# Patient Record
Sex: Female | Born: 1955
Health system: Southern US, Community
[De-identification: ages and names within clinical notes are randomized; demographics above are authoritative.]

## PROBLEM LIST (undated history)

## (undated) DIAGNOSIS — M1990 Unspecified osteoarthritis, unspecified site: Secondary | ICD-10-CM

## (undated) DIAGNOSIS — E119 Type 2 diabetes mellitus without complications: Secondary | ICD-10-CM

## (undated) DIAGNOSIS — F32A Depression, unspecified: Secondary | ICD-10-CM

## (undated) DIAGNOSIS — F329 Major depressive disorder, single episode, unspecified: Secondary | ICD-10-CM

## (undated) DIAGNOSIS — E785 Hyperlipidemia, unspecified: Secondary | ICD-10-CM

## (undated) DIAGNOSIS — D649 Anemia, unspecified: Secondary | ICD-10-CM

## (undated) DIAGNOSIS — T7840XA Allergy, unspecified, initial encounter: Secondary | ICD-10-CM

## (undated) DIAGNOSIS — J45909 Unspecified asthma, uncomplicated: Secondary | ICD-10-CM

## (undated) DIAGNOSIS — F419 Anxiety disorder, unspecified: Secondary | ICD-10-CM

## (undated) DIAGNOSIS — K219 Gastro-esophageal reflux disease without esophagitis: Secondary | ICD-10-CM

## (undated) HISTORY — DX: Unspecified asthma, uncomplicated: J45.909

## (undated) HISTORY — DX: Allergy, unspecified, initial encounter: T78.40XA

## (undated) HISTORY — DX: Hyperlipidemia, unspecified: E78.5

## (undated) HISTORY — DX: Anemia, unspecified: D64.9

## (undated) HISTORY — PX: TRIGGER FINGER RELEASE: SHX641

## (undated) HISTORY — DX: Gastro-esophageal reflux disease without esophagitis: K21.9

## (undated) HISTORY — DX: Major depressive disorder, single episode, unspecified: F32.9

## (undated) HISTORY — DX: Depression, unspecified: F32.A

---

## 2000-02-19 ENCOUNTER — Ambulatory Visit (HOSPITAL_COMMUNITY): Admission: RE | Admit: 2000-02-19 | Discharge: 2000-02-19 | Payer: Self-pay | Admitting: Gastroenterology

## 2003-07-06 ENCOUNTER — Encounter: Payer: Self-pay | Admitting: Internal Medicine

## 2003-07-06 ENCOUNTER — Encounter: Admission: RE | Admit: 2003-07-06 | Discharge: 2003-07-06 | Payer: Self-pay | Admitting: Internal Medicine

## 2008-10-29 ENCOUNTER — Ambulatory Visit: Payer: Self-pay | Admitting: Internal Medicine

## 2009-05-06 ENCOUNTER — Ambulatory Visit: Payer: Self-pay | Admitting: Internal Medicine

## 2009-11-04 ENCOUNTER — Ambulatory Visit: Payer: Self-pay | Admitting: Internal Medicine

## 2010-07-18 ENCOUNTER — Ambulatory Visit: Payer: Self-pay | Admitting: Internal Medicine

## 2010-12-01 ENCOUNTER — Ambulatory Visit: Payer: Self-pay | Admitting: Internal Medicine

## 2011-05-11 NOTE — Procedures (Signed)
[facility]  Patient:    Donna Spears, Donna Spears                        MRN: [id number] Proc. Date: 02/19/00 Adm. Date:  [id number] Attending:  Nelda Marseille Dictator:   Petra Kuba, M.D. CC:         Petra Kuba, M.D.             Luanna Cole. Lenord Fellers, M.D.                           Procedure Report  PROCEDURE:  Colonoscopy.  INDICATIONS:  Colon cancer and colon polyps on both sides of the family with some bright red blood per rectum probably due to hemorrhoids.  CONSENT:  Consent was signed after risks, benefits, methods, and options were thoroughly discussed in the office.  ANESTHESIA: 1. Demerol 90 mg intravenously. 2. Versed 9 mg intravenously.  DESCRIPTION OF PROCEDURE:  Rectal inspection is pertinent for active internal and external hemorrhoids small.  Digital examination was negative.  A pediatric video colonoscope was inserted, and with some difficulty due to some tortuosity and some looping, we were able to advance to the cecum.  This did require some abdominal  pressure and rolling the patient on her back.  The cecum was identified by the appendiceal orifice and ileocecal valve.  Prep was adequate, although there was  lots of liquid stools that required washing and suctioning, but on slow withdrawal through the colon no abnormalities were seen, as well as on insertion there were no abnormalities seen.  Once back in the rectum the scope was retroflexed, pertinent for some internal hemorrhoids.  Scope was straightened.  The scope was removed.  The patient tolerated the procedure adequately.  There were no obvious immediate complication.  ENDOSCOPIC DIAGNOSES: 1. Internal and external hemorrhoids. 2. Otherwise within normal limits to the cecum.  PLAN: 1. Yearly rectals and guaiacs per Dr. Lenord Fellers. 2. Gastrointestinal follow-up p.r.n. 3. Otherwise, probably a recheck colon screening in 5 years unless needed sooner     p.r.n. DD:  02/19/00 TD:  02/19/00 Job: [id number]

## 2011-10-20 ENCOUNTER — Other Ambulatory Visit: Payer: Self-pay | Admitting: Internal Medicine

## 2011-12-23 ENCOUNTER — Other Ambulatory Visit: Payer: Self-pay | Admitting: Internal Medicine

## 2012-06-23 ENCOUNTER — Other Ambulatory Visit: Payer: Self-pay | Admitting: Internal Medicine

## 2012-08-08 ENCOUNTER — Other Ambulatory Visit: Payer: Self-pay | Admitting: Internal Medicine

## 2012-08-11 ENCOUNTER — Other Ambulatory Visit: Payer: Self-pay | Admitting: Internal Medicine

## 2012-08-11 DIAGNOSIS — Z0000 Encounter for general adult medical examination without abnormal findings: Secondary | ICD-10-CM

## 2012-08-11 LAB — CBC WITH DIFFERENTIAL/PLATELET
Basophils Absolute: 0 10*3/uL (ref 0.0–0.1)
Lymphocytes Relative: 35 % (ref 12–46)
Lymphs Abs: 1.6 10*3/uL (ref 0.7–4.0)
MCHC: 34.7 g/dL (ref 30.0–36.0)
Neutrophils Relative %: 50 % (ref 43–77)
Platelets: 234 10*3/uL (ref 150–400)
RDW: 13.3 % (ref 11.5–15.5)
WBC: 4.6 10*3/uL (ref 4.0–10.5)

## 2012-08-11 LAB — COMPREHENSIVE METABOLIC PANEL
BUN: 15 mg/dL (ref 6–23)
Chloride: 106 mEq/L (ref 96–112)
Creat: 0.58 mg/dL (ref 0.50–1.10)
Glucose, Bld: 85 mg/dL (ref 70–99)
Potassium: 4.1 mEq/L (ref 3.5–5.3)
Sodium: 142 mEq/L (ref 135–145)
Total Protein: 6.4 g/dL (ref 6.0–8.3)

## 2012-08-11 LAB — LIPID PANEL
Cholesterol: 307 mg/dL — ABNORMAL HIGH (ref 0–200)
HDL: 42 mg/dL (ref >39)
Total CHOL/HDL Ratio: 7.3 Ratio
Triglycerides: 273 mg/dL — ABNORMAL HIGH (ref <150)
VLDL: 55 mg/dL — ABNORMAL HIGH (ref 0–40)

## 2012-08-12 ENCOUNTER — Encounter: Payer: Self-pay | Admitting: Internal Medicine

## 2012-08-12 ENCOUNTER — Ambulatory Visit (INDEPENDENT_AMBULATORY_CARE_PROVIDER_SITE_OTHER): Payer: 59 | Admitting: Internal Medicine

## 2012-08-12 VITALS — BP 134/90 | HR 76 | Temp 99.0°F | Ht 63.5 in | Wt 186.5 lb

## 2012-08-12 DIAGNOSIS — F329 Major depressive disorder, single episode, unspecified: Secondary | ICD-10-CM

## 2012-08-12 DIAGNOSIS — K219 Gastro-esophageal reflux disease without esophagitis: Secondary | ICD-10-CM

## 2012-08-12 DIAGNOSIS — Z0000 Encounter for general adult medical examination without abnormal findings: Secondary | ICD-10-CM

## 2012-08-12 DIAGNOSIS — F32A Depression, unspecified: Secondary | ICD-10-CM

## 2012-08-12 DIAGNOSIS — E782 Mixed hyperlipidemia: Secondary | ICD-10-CM | POA: Insufficient documentation

## 2012-08-12 LAB — VITAMIN D 25 HYDROXY (VIT D DEFICIENCY, FRACTURES): Vit D, 25-Hydroxy: 32 ng/mL (ref 30–89)

## 2012-08-12 NOTE — Progress Notes (Signed)
Subjective:    Patient ID: Donna Spears, female    DOB: Sep 23, 1956, 56 y.o.   MRN: [id number]  HPI 56 year old white female with history of hyperlipidemia (mixed) GE reflux, depression, in today for health maintenance exam. Not seen here since December 2011. She has retired and is keeping 2 grandchildren. She's had a couple of falls over the past year but may be related to losing her balance or tripping. No frank syncope. She has run out of Lipitor. Lipid panel is abnormal with elevated total cholesterol and triglycerides and LDL cholesterol. Has been out of Lipitor for several months. Continues to take Nexium 40 mg daily and prostatic 50 mg daily. No known drug allergies.  Prior history of allergic rhinitis and fibrocystic breast disease. Due for mammogram in [facility]. Order was written and given to her. She had colonoscopy by Dr. Ewing Schlein 12/31/2005.  History of right trigger finger release by Dr. Mina Marble.  Tdap vaccine update given today.  Former GYN was Dr. Lelon Perla who has retired. She has appointment to be seen in September at Dr. Olegario Messier [facility]'s GYN office here in [place].  Social history: The patient worked in Tax adviser for [employer]. Has a college degree. Is married to a Runner, broadcasting/film/video. Does not smoke or consume alcohol.  Family history: Mother with history of heart problems and lung cancer as well as colon cancer. One brother in good health. 2 children a son and a daughter.    Review of Systems  Constitutional: Negative.   HENT: Negative.   Eyes: Negative.   Respiratory: Negative.   Cardiovascular: Negative.   Gastrointestinal: Negative.   Genitourinary: Negative.   Musculoskeletal: Negative.   Neurological: Negative.   Hematological: Negative.   Psychiatric/Behavioral: Negative.        Objective:   Physical Exam  Vitals reviewed. Constitutional: She is oriented to person, place, and time. She appears well-developed and well-nourished. No distress.    HENT:  Head: Normocephalic and atraumatic.  Right Ear: External ear normal.  Left Ear: External ear normal.  Mouth/Throat: Oropharynx is clear and moist. No oropharyngeal exudate.  Eyes: EOM are normal. Pupils are equal, round, and reactive to light. Right eye exhibits no discharge. Left eye exhibits no discharge. No scleral icterus.  Neck: Neck supple. No JVD present. No thyromegaly present.  Cardiovascular: Normal rate, regular rhythm, normal heart sounds and intact distal pulses.   No murmur heard. Pulmonary/Chest: Effort normal and breath sounds normal. No respiratory distress. She has no wheezes. She has no rales.       Breasts normal female without masses  Abdominal: Soft. Bowel sounds are normal. She exhibits no distension and no mass. There is no tenderness. There is no rebound.  Genitourinary:       Deferred to GYN  Musculoskeletal: She exhibits no edema.  Lymphadenopathy:    She has no cervical adenopathy.  Neurological: She is alert and oriented to person, place, and time. She has normal reflexes. No cranial nerve deficit. Coordination normal.  Skin: Skin is warm and dry. No rash noted. She is not diaphoretic.  Psychiatric: She has a normal mood and affect. Her behavior is normal. Judgment and thought content normal.          Assessment & Plan:  Hyperlipidemia  GE reflux  History of depression-controlled well with prostatic 50 mg daily  Plan: Refill Lipitor 40 mg (90) one by mouth daily with 2 refills. Have fasting lipid panel liver functions and office visit in 6 months. Refill Pristiq  and Nexium for one year 90 day prescription with 3 refills

## 2012-08-12 NOTE — Patient Instructions (Addendum)
Start Lipitor 40 mg daily. Return in 6 months for fasting lipid panel liver functions and office visit for followup of hyperlipidemia, depression and GE reflux. You have received Tdap vaccine today.

## 2012-08-26 ENCOUNTER — Other Ambulatory Visit: Payer: Self-pay | Admitting: Internal Medicine

## 2012-11-05 ENCOUNTER — Other Ambulatory Visit: Payer: Self-pay | Admitting: Obstetrics and Gynecology

## 2012-11-05 DIAGNOSIS — Z780 Asymptomatic menopausal state: Secondary | ICD-10-CM

## 2012-11-05 DIAGNOSIS — N6489 Other specified disorders of breast: Secondary | ICD-10-CM

## 2012-11-14 ENCOUNTER — Ambulatory Visit
Admission: RE | Admit: 2012-11-14 | Discharge: 2012-11-14 | Disposition: A | Discharge status: Home / Self Care | Payer: 59 | Source: Ambulatory Visit | Attending: Obstetrics and Gynecology | Admitting: Obstetrics and Gynecology

## 2012-11-14 DIAGNOSIS — Z780 Asymptomatic menopausal state: Secondary | ICD-10-CM

## 2012-11-14 DIAGNOSIS — N6489 Other specified disorders of breast: Secondary | ICD-10-CM

## 2013-01-24 ENCOUNTER — Ambulatory Visit: Payer: 59

## 2013-01-24 ENCOUNTER — Ambulatory Visit (INDEPENDENT_AMBULATORY_CARE_PROVIDER_SITE_OTHER): Payer: 59 | Admitting: Physician Assistant

## 2013-01-24 VITALS — BP 129/78 | HR 73 | Temp 97.8°F | Resp 16 | Ht 64.5 in | Wt 181.0 lb

## 2013-01-24 DIAGNOSIS — J209 Acute bronchitis, unspecified: Secondary | ICD-10-CM

## 2013-01-24 DIAGNOSIS — R05 Cough: Secondary | ICD-10-CM

## 2013-01-24 DIAGNOSIS — R059 Cough, unspecified: Secondary | ICD-10-CM

## 2013-01-24 DIAGNOSIS — J9801 Acute bronchospasm: Secondary | ICD-10-CM

## 2013-01-24 DIAGNOSIS — Z131 Encounter for screening for diabetes mellitus: Secondary | ICD-10-CM

## 2013-01-24 DIAGNOSIS — R062 Wheezing: Secondary | ICD-10-CM

## 2013-01-24 LAB — GLUCOSE, POCT (MANUAL RESULT ENTRY): POC Glucose: 83 mg/dl (ref 70–99)

## 2013-01-24 LAB — POCT CBC
Granulocyte percent: 65.9 %G (ref 37–80)
HCT, POC: 43.4 % (ref 37.7–47.9)
Hemoglobin: 13.8 g/dL (ref 12.2–16.2)
MCHC: 31.8 g/dL (ref 31.8–35.4)
MPV: 8.7 fL (ref 0–99.8)
POC Granulocyte: 4 (ref 2–6.9)
POC MID %: 7.2 %M (ref 0–12)

## 2013-01-24 MED ORDER — PREDNISONE 10 MG PO TABS: ORAL_TABLET | ORAL | Status: DC

## 2013-01-24 MED ORDER — LEVOFLOXACIN 500 MG PO TABS: 500.0000 mg | ORAL_TABLET | Freq: Every day | ORAL | Status: DC

## 2013-01-24 MED ORDER — ALBUTEROL SULFATE (2.5 MG/3ML) 0.083% IN NEBU
2.5000 mg | INHALATION_SOLUTION | Freq: Once | RESPIRATORY_TRACT | Status: AC
  Administered 2013-01-24: 2.5 mg via RESPIRATORY_TRACT

## 2013-01-24 MED ORDER — IPRATROPIUM BROMIDE 0.02 % IN SOLN
0.5000 mg | Freq: Once | RESPIRATORY_TRACT | Status: AC
  Administered 2013-01-24: 0.5 mg via RESPIRATORY_TRACT

## 2013-01-24 MED ORDER — HYDROCOD POLST-CHLORPHEN POLST 10-8 MG/5ML PO LQCR: 5.0000 mL | Freq: Two times a day (BID) | ORAL | Status: DC | PRN

## 2013-01-24 MED ORDER — ALBUTEROL SULFATE HFA 108 (90 BASE) MCG/ACT IN AERS: 2.0000 | INHALATION_SPRAY | Freq: Four times a day (QID) | RESPIRATORY_TRACT | Status: DC | PRN

## 2013-01-24 MED ORDER — MOMETASONE FURO-FORMOTEROL FUM 200-5 MCG/ACT IN AERO: 2.0000 | INHALATION_SPRAY | Freq: Two times a day (BID) | RESPIRATORY_TRACT | Status: DC

## 2013-01-24 MED ORDER — METHYLPREDNISOLONE ACETATE 80 MG/ML IJ SUSP
80.0000 mg | Freq: Once | INTRAMUSCULAR | Status: AC
  Administered 2013-01-24: 80 mg via INTRAMUSCULAR

## 2013-01-24 NOTE — Progress Notes (Signed)
Subjective:    Patient ID: Donna Spears, female    DOB: 02/10/1956, 57 y.o.   MRN: [id number]  HPI 57 yr old CF presents with ~10 day h/o cough, congestion, wheezing.  She had a fever for a few days when it started originally, but no fever the last several days.  She is asthmatic, but she doesn't have to take meds to keep it under control on a regular basis.   Review of Systems  All other systems reviewed and are negative.       Objective:   Physical Exam  Nursing note and vitals reviewed. Constitutional: She is oriented to person, place, and time. She appears well-developed and well-nourished.  HENT:  Head: Normocephalic and atraumatic.  Right Ear: External ear normal.  Left Ear: External ear normal.  Mouth/Throat: No oropharyngeal exudate.  Neck: Normal range of motion. Neck supple. No thyromegaly present.  Cardiovascular: Normal rate, regular rhythm and normal heart sounds.   Pulmonary/Chest: Effort normal. No respiratory distress. She has wheezes.       Pulse ox 98%. Diffuse rhonchi and wheezing with forced expiration.  Lymphadenopathy:    She has no cervical adenopathy.  Neurological: She is alert and oriented to person, place, and time.  Skin: Skin is warm and dry.  Psychiatric: She has a normal mood and affect. Her behavior is normal.   Results for orders placed in visit on 01/24/13  POCT CBC      Component Value Range   WBC 6.1  4.6 - 10.2 K/uL   Lymph, poc 1.6  0.6 - 3.4   POC LYMPH PERCENT 26.9  10 - 50 %L   MID (cbc) 0.4  0 - 0.9   POC MID % 7.2  0 - 12 %M   POC Granulocyte 4.0  2 - 6.9   Granulocyte percent 65.9  37 - 80 %G   RBC 4.96  4.04 - 5.48 M/uL   Hemoglobin 13.8  12.2 - 16.2 g/dL   HCT, POC 40.9  81.1 - 47.9 %   MCV 87.5  80 - 97 fL   MCH, POC 27.8  27 - 31.2 pg   MCHC 31.8  31.8 - 35.4 g/dL   RDW, POC 91.4     Platelet Count, POC 295  142 - 424 K/uL   MPV 8.7  0 - 99.8 fL  GLUCOSE, POCT (MANUAL RESULT ENTRY)      Component Value Range   POC  Glucose 83  70 - 99 mg/dl   [facility] reading (PRIMARY) by  Dr. Cleta Alberts as no infiltrate.    Assessment & Plan:  Bronchitis and bronchospasm-I doubt she will need the oral prednisone, but she requested the prescription as she has a history of asthma and she has often had to take oral prednisone if she wasn't improving. She will only take it if she doesn't improve over the next 36-48 hours. Meds ordered this encounter  Medications  . albuterol (PROVENTIL) (2.5 MG/3ML) 0.083% nebulizer solution 2.5 mg    Sig:   . ipratropium (ATROVENT) nebulizer solution 0.5 mg    Sig:   . methylPREDNISolone acetate (DEPO-MEDROL) injection 80 mg    Sig:   . levofloxacin (LEVAQUIN) 500 MG tablet    Sig: Take 1 tablet (500 mg total) by mouth daily.    Dispense:  7 tablet    Refill:  0    Order Specific Question:  Supervising Provider    Answer:  DOOLITTLE, ROBERT P [[id number]]  . chlorpheniramine-HYDROcodone (TUSSIONEX)  10-8 MG/5ML LQCR    Sig: Take 5 mLs by mouth every 12 (twelve) hours as needed.    Dispense:  140 mL    Refill:  0    Order Specific Question:  Supervising Provider    Answer:  DOOLITTLE, ROBERT P [[id number]]  . mometasone-formoterol (DULERA) 200-5 MCG/ACT AERO    Sig: Inhale 2 puffs into the lungs 2 (two) times daily.    Dispense:  8.8 g    Refill:  0    Order Specific Question:  Supervising Provider    Answer:  DOOLITTLE, ROBERT P [[id number]]  . predniSONE (DELTASONE) 10 MG tablet    Sig: 6,5,4,3,2,1    Dispense:  21 tablet    Refill:  0    Order Specific Question:  Supervising Provider    Answer:  DOOLITTLE, ROBERT P [[id number]]  . albuterol (PROVENTIL HFA;VENTOLIN HFA) 108 (90 BASE) MCG/ACT inhaler    Sig: Inhale 2 puffs into the lungs every 6 (six) hours as needed for wheezing.    Dispense:  1 Inhaler    Refill:  0    Order Specific Question:  Supervising Provider    Answer:  DOOLITTLE, ROBERT P [[id number]]

## 2013-01-24 NOTE — Patient Instructions (Addendum)
Drink plenty of fluids and rest.

## 2013-02-16 ENCOUNTER — Other Ambulatory Visit: Payer: 59 | Admitting: Internal Medicine

## 2013-02-17 ENCOUNTER — Ambulatory Visit: Payer: 59 | Admitting: Internal Medicine

## 2013-03-23 ENCOUNTER — Other Ambulatory Visit: Payer: 59 | Admitting: Internal Medicine

## 2013-03-23 DIAGNOSIS — Z79899 Other long term (current) drug therapy: Secondary | ICD-10-CM

## 2013-03-23 DIAGNOSIS — E785 Hyperlipidemia, unspecified: Secondary | ICD-10-CM

## 2013-03-23 LAB — HEPATIC FUNCTION PANEL: Bilirubin, Direct: <0.1 mg/dL (ref 0.0–0.3)

## 2013-03-23 LAB — LIPID PANEL
LDL Cholesterol: 99 mg/dL (ref 0–99)
Total CHOL/HDL Ratio: 3.6 Ratio
VLDL: 23 mg/dL (ref 0–40)

## 2013-03-24 ENCOUNTER — Encounter: Payer: Self-pay | Admitting: Internal Medicine

## 2013-03-24 ENCOUNTER — Ambulatory Visit (INDEPENDENT_AMBULATORY_CARE_PROVIDER_SITE_OTHER): Payer: 59 | Admitting: Internal Medicine

## 2013-03-24 VITALS — BP 120/74 | HR 76 | Temp 98.4°F | Wt 184.0 lb

## 2013-03-24 DIAGNOSIS — E785 Hyperlipidemia, unspecified: Secondary | ICD-10-CM

## 2013-03-24 DIAGNOSIS — F329 Major depressive disorder, single episode, unspecified: Secondary | ICD-10-CM

## 2013-03-24 DIAGNOSIS — F32A Depression, unspecified: Secondary | ICD-10-CM

## 2013-03-24 DIAGNOSIS — K219 Gastro-esophageal reflux disease without esophagitis: Secondary | ICD-10-CM

## 2013-03-24 MED ORDER — PRISTIQ 50 MG PO TB24: ORAL_TABLET | ORAL | Status: DC

## 2013-03-24 MED ORDER — NEXIUM 40 MG PO CPDR: DELAYED_RELEASE_CAPSULE | ORAL | Status: DC

## 2013-03-24 MED ORDER — ATORVASTATIN CALCIUM 40 MG PO TABS: 40.0000 mg | ORAL_TABLET | Freq: Every day | ORAL | Status: DC

## 2013-05-06 ENCOUNTER — Ambulatory Visit (INDEPENDENT_AMBULATORY_CARE_PROVIDER_SITE_OTHER): Payer: 59 | Admitting: Family Medicine

## 2013-05-06 VITALS — BP 132/73 | HR 91 | Temp 98.3°F | Resp 17 | Ht 64.0 in | Wt 182.0 lb

## 2013-05-06 DIAGNOSIS — J309 Allergic rhinitis, unspecified: Secondary | ICD-10-CM

## 2013-05-06 DIAGNOSIS — B009 Herpesviral infection, unspecified: Secondary | ICD-10-CM

## 2013-05-06 DIAGNOSIS — J45901 Unspecified asthma with (acute) exacerbation: Secondary | ICD-10-CM

## 2013-05-06 DIAGNOSIS — J302 Other seasonal allergic rhinitis: Secondary | ICD-10-CM

## 2013-05-06 MED ORDER — HYDROCOD POLST-CHLORPHEN POLST 10-8 MG/5ML PO LQCR: 5.0000 mL | Freq: Two times a day (BID) | ORAL | Status: DC | PRN

## 2013-05-06 MED ORDER — ACYCLOVIR 5 % EX OINT: TOPICAL_OINTMENT | CUTANEOUS | Status: DC

## 2013-05-06 MED ORDER — FLUTICASONE PROPIONATE 50 MCG/ACT NA SUSP: 2.0000 | Freq: Every day | NASAL | Status: DC

## 2013-05-06 MED ORDER — ALBUTEROL SULFATE HFA 108 (90 BASE) MCG/ACT IN AERS: 2.0000 | INHALATION_SPRAY | RESPIRATORY_TRACT | Status: DC | PRN

## 2013-05-06 MED ORDER — CETIRIZINE HCL 10 MG PO TABS: 10.0000 mg | ORAL_TABLET | Freq: Every day | ORAL | Status: DC

## 2013-05-06 MED ORDER — MOMETASONE FURO-FORMOTEROL FUM 200-5 MCG/ACT IN AERO: 2.0000 | INHALATION_SPRAY | Freq: Two times a day (BID) | RESPIRATORY_TRACT | Status: DC

## 2013-05-06 MED ORDER — METHYLPREDNISOLONE ACETATE 80 MG/ML IJ SUSP
80.0000 mg | Freq: Once | INTRAMUSCULAR | Status: AC
  Administered 2013-05-06: 80 mg via INTRAMUSCULAR

## 2013-05-06 NOTE — Progress Notes (Signed)
Subjective:    Patient ID: Donna Spears, female    DOB: 1956/06/13, 57 y.o.   MRN: [id number] Chief Complaint  Patient presents with  . Cough  . URI  . Allergies    HPI  57 yo w/ h/o asthma and seasonal allergies - this illness began about 5d ago and worsening.  She has taken tussionex in the past for her cough - she tried some that she had left over but didn't help at all last night - still coughing and unable to sleep. Is rattling in her chest.  Thinks this is an allergy exacerbations. No f/c, not using any otc allergy meds.  A lot of rhinitis and a lot of sinus pressure and pain - diffusely through forehead, eyes, cheeks, back of neck all hurting.  Spending a lot of time outside.  Coughing up sputum that is a little discolored.   Has not been using sinus rinse or netti pot.  Not using Dulera or albuterol.  Has some nasocort at home which she hasn't tried.  Past Medical History  Diagnosis Date  . Anemia   . Asthma   . Depression   . GERD (gastroesophageal reflux disease)   . Hyperlipidemia   . Allergy    Current Outpatient Prescriptions on File Prior to Visit  Medication Sig Dispense Refill  . atorvastatin (LIPITOR) 40 MG tablet Take 1 tablet (40 mg total) by mouth daily.  90 tablet  1  . NEXIUM 40 MG capsule TAKE 1 CAPSULE DAILY AS NEEDED  90 capsule  1  . PRISTIQ 50 MG 24 hr tablet TAKE 1 TABLET DAILY  90 tablet  1   No current facility-administered medications on file prior to visit.   Allergies  Allergen Reactions  . Azithromycin Nausea And Vomiting     Review of Systems  Constitutional: Positive for activity change and fatigue. Negative for fever, chills, diaphoresis and appetite change.  HENT: Positive for congestion, rhinorrhea, sneezing, neck pain, postnasal drip and sinus pressure. Negative for ear pain, nosebleeds, sore throat, trouble swallowing, neck stiffness, voice change and ear discharge.   Eyes: Negative for discharge and itching.  Respiratory: Positive for  cough and wheezing. Negative for chest tightness, shortness of breath and stridor.   Cardiovascular: Negative for chest pain.  Gastrointestinal: Negative for nausea, vomiting and abdominal pain.  Skin: Negative for rash.  Neurological: Positive for headaches. Negative for dizziness and syncope.  Hematological: Positive for adenopathy.  Psychiatric/Behavioral: Positive for sleep disturbance.      BP 132/73  Pulse 91  Temp(Src) 98.3 F (36.8 C) (Oral)  Resp 17  Ht 5\' 4"  (1.626 m)  Wt 182 lb (82.555 kg)  BMI 31.22 kg/m2  SpO2 96% Objective:   Physical Exam  Constitutional: She is oriented to person, place, and time. She appears well-developed and well-nourished. She does not appear ill. No distress.  HENT:  Head: Normocephalic and atraumatic.  Right Ear: External ear and ear canal normal. Tympanic membrane is injected and retracted. A middle ear effusion is present.  Left Ear: External ear and ear canal normal. Tympanic membrane is injected and retracted. A middle ear effusion is present.  Nose: Mucosal edema and rhinorrhea present. Right sinus exhibits maxillary sinus tenderness and frontal sinus tenderness. Left sinus exhibits maxillary sinus tenderness and frontal sinus tenderness.  Mouth/Throat: Uvula is midline and mucous membranes are normal. Posterior oropharyngeal erythema present. No oropharyngeal exudate, posterior oropharyngeal edema or tonsillar abscesses.  Eyes: Conjunctivae are normal. Right eye exhibits no discharge.  Left eye exhibits no discharge. No scleral icterus.  Neck: Normal range of motion. Neck supple.  Cardiovascular: Normal rate, regular rhythm, normal heart sounds and intact distal pulses.   Pulmonary/Chest: Effort normal. No respiratory distress. She has decreased breath sounds. She has wheezes.  Lymphadenopathy:       Head (right side): No submandibular, no preauricular and no posterior auricular adenopathy present.       Head (left side): No submandibular,  no preauricular and no posterior auricular adenopathy present.    She has no cervical adenopathy.       Right: No supraclavicular adenopathy present.       Left: No supraclavicular adenopathy present.  Neurological: She is alert and oriented to person, place, and time.  Skin: Skin is warm and dry. She is not diaphoretic. No erythema.  Psychiatric: She has a normal mood and affect. Her behavior is normal.      Assessment & Plan:  Asthma with acute exacerbation - Plan: methylPREDNISolone acetate (DEPO-MEDROL) injection 80 mg - restart dulera w/ prn albuterol.  Advised pt to consider baseline PFTs pre and post neb when not in flair to see if she needs maintenance med (does not use dulera reg - only w/ illnesses per pt.)  Seasonal allergies - Plan: methylPREDNISolone acetate (DEPO-MEDROL) injection 80 mg - suspect sinus congestion is causing most sxs and seasonal allergies are triggering her asthma.  Start zyrtec and flonase for at least 2 wks.  Try netti pot or sinus rinse daily.  Hopefully IM depo-medrol given today will help sxs until meds can kick in. RTC if worsens as may need antibiotic but no signs of sinus infection at this time.  HSV-1 infection - pt does not want to take pills for "cold sores/fever blisters". Refilled zovirax.  Meds ordered this encounter  Medications  . acyclovir ointment (ZOVIRAX) 5 %    Sig: Apply topically every 3 (three) hours.    Dispense:  5 g    Refill:  5  . mometasone-formoterol (DULERA) 200-5 MCG/ACT AERO    Sig: Inhale 2 puffs into the lungs 2 (two) times daily.    Dispense:  8.8 g    Refill:  0    Order Specific Question:  Supervising Provider    Answer:  DOOLITTLE, ROBERT P [[id number]]  . albuterol (PROVENTIL HFA;VENTOLIN HFA) 108 (90 BASE) MCG/ACT inhaler    Sig: Inhale 2 puffs into the lungs every 4 (four) hours as needed for wheezing or shortness of breath.    Dispense:  1 Inhaler    Refill:  1    Order Specific Question:  Supervising Provider     Answer:  DOOLITTLE, ROBERT P [[id number]]  . cetirizine (ZYRTEC) 10 MG tablet    Sig: Take 1 tablet (10 mg total) by mouth at bedtime.    Dispense:  30 tablet    Refill:  5  . fluticasone (FLONASE) 50 MCG/ACT nasal spray    Sig: Place 2 sprays into the nose at bedtime.    Dispense:  16 g    Refill:  6  . chlorpheniramine-HYDROcodone (TUSSIONEX PENNKINETIC ER) 10-8 MG/5ML LQCR    Sig: Take 5 mLs by mouth every 12 (twelve) hours as needed (cough).    Dispense:  80 mL    Refill:  0  . methylPREDNISolone acetate (DEPO-MEDROL) injection 80 mg    Sig:

## 2013-05-06 NOTE — Patient Instructions (Addendum)
Hot showers or breathing in steam may help loosen the congestion.  Using a netti pot or sinus rinse is also likely to help you feel better and keep this from progressing.  Use the fluticasone nasal spray and cetirizine (zyrtec) and albuterol every night before bed for at least 2 weeks in addition to the twice daily Dulera.  I recommend augmenting with 12 hr sudafed (behind the counter) and generic mucinex to help you move out the congestion.  If no improvement or you are getting worse, come back as you might need a course of steroids or antibiotics but hopefully with all of the above, you can avoid it.  Asthma Attack Prevention HOW CAN ASTHMA BE PREVENTED? Currently, there is no way to prevent asthma from starting. However, you can take steps to control the disease and prevent its symptoms after you have been diagnosed. Learn about your asthma and how to control it. Take an active role to control your asthma by working with your caregiver to create and follow an asthma action plan. An asthma action plan guides you in taking your medicines properly, avoiding factors that make your asthma worse, tracking your level of asthma control, responding to worsening asthma, and seeking emergency care when needed. To track your asthma, keep records of your symptoms, check your peak flow number using a peak flow meter (handheld device that shows how well air moves out of your lungs), and get regular asthma checkups.  Other ways to prevent asthma attacks include:  Use medicines as your caregiver directs.  Identify and avoid things that make your asthma worse (as much as you can).  Keep track of your asthma symptoms and level of control.  Get regular checkups for your asthma.  With your caregiver, write a detailed plan for taking medicines and managing an asthma attack. Then be sure to follow your action plan. Asthma is an ongoing condition that needs regular monitoring and treatment.  Identify and avoid asthma  triggers. A number of outdoor allergens and irritants (pollen, mold, cold air, air pollution) can trigger asthma attacks. Find out what causes or makes your asthma worse, and take steps to avoid those triggers (see below).  Monitor your breathing. Learn to recognize warning signs of an attack, such as slight coughing, wheezing or shortness of breath. However, your lung function may already decrease before you notice any signs or symptoms, so regularly measure and record your peak airflow with a home peak flow meter.  Identify and treat attacks early. If you act quickly, you're less likely to have a severe attack. You will also need less medicine to control your symptoms. When your peak flow measurements decrease and alert you to an upcoming attack, take your medicine as instructed, and immediately stop any activity that may have triggered the attack. If your symptoms do not improve, get medical help.  Pay attention to increasing quick-relief inhaler use. If you find yourself relying on your quick-relief inhaler (such as albuterol), your asthma is not under control. See your caregiver about adjusting your treatment. IDENTIFY AND CONTROL FACTORS THAT MAKE YOUR ASTHMA WORSE A number of common things can set off or make your asthma symptoms worse (asthma triggers). Keep track of your asthma symptoms for several weeks, detailing all the environmental and emotional factors that are linked with your asthma. When you have an asthma attack, go back to your asthma diary to see which factor, or combination of factors, might have contributed to it. Once you know what these factors  are, you can take steps to control many of them.  Allergies: If you have allergies and asthma, it is important to take asthma prevention steps at home. Asthma attacks (worsening of asthma symptoms) can be triggered by allergies, which can cause temporary increased inflammation of your airways. Minimizing contact with the substance to which  you are allergic will help prevent an asthma attack. Animal Dander:   Some people are allergic to the flakes of skin or dried saliva from animals with fur or feathers. Keep these pets out of your home.  If you can't keep a pet outdoors, keep the pet out of your bedroom and other sleeping areas at all times, and keep the door closed.  Remove carpets and furniture covered with cloth from your home. If that is not possible, keep the pet away from fabric-covered furniture and carpets. Dust Mites:  Many people with asthma are allergic to dust mites. Dust mites are tiny bugs that are found in every home, in mattresses, pillows, carpets, fabric-covered furniture, bedcovers, clothes, stuffed toys, fabric, and other fabric-covered items.  Cover your mattress in a special dust-proof cover.  Cover your pillow in a special dust-proof cover, or wash the pillow each week in hot water. Water must be hotter than 130 F to kill dust mites. Cold or warm water used with detergent and bleach can also be effective.  Wash the sheets and blankets on your bed each week in hot water.  Try not to sleep or lie on cloth-covered cushions.  Call ahead when traveling and ask for a smoke-free hotel room. Bring your own bedding and pillows, in case the hotel only supplies feather pillows and down comforters, which may contain dust mites and cause asthma symptoms.  Remove carpets from your bedroom and those laid on concrete, if you can.  Keep stuffed toys out of the bed, or wash the toys weekly in hot water or cooler water with detergent and bleach. Cockroaches:  Many people with asthma are allergic to the droppings and remains of cockroaches.  Keep food and garbage in closed containers. Never leave food out.  Use poison baits, traps, powders, gels, or paste (for example, boric acid).  If a spray is used to kill cockroaches, stay out of the room until the odor goes away. Indoor Mold:  Fix leaky faucets, pipes, or  other sources of water that have mold around them.  Clean moldy surfaces with a cleaner that has bleach in it. Pollen and Outdoor Mold:  When pollen or mold spore counts are high, try to keep your windows closed.  Stay indoors with windows closed from late morning to afternoon, if you can. Pollen and some mold spore counts are highest at that time.  Ask your caregiver whether you need to take or increase anti-inflammatory medicine before your allergy season starts. Irritants:   Tobacco smoke is an irritant. If you smoke, ask your caregiver how you can quit. Ask family members to quit smoking, too. Do not allow smoking in your home or car.  If possible, do not use a wood-burning stove, kerosene heater, or fireplace. Minimize exposure to all sources of smoke, including incense, candles, fires, and fireworks.  Try to stay away from strong odors and sprays, such as perfume, talcum powder, hair spray, and paints.  Decrease humidity in your home and use an indoor air cleaning device. Reduce indoor humidity to below 60 percent. Dehumidifiers or central air conditioners can do this.  Try to have someone else vacuum for  you once or twice a week, if you can. Stay out of rooms while they are being vacuumed and for a short while afterward.  If you vacuum, use a dust mask from a hardware store, a double-layered or microfilter vacuum cleaner bag, or a vacuum cleaner with a HEPA filter.  Sulfites in foods and beverages can be irritants. Do not drink beer or wine, or eat dried fruit, processed potatoes, or shrimp if they cause asthma symptoms.  Cold air can trigger an asthma attack. Cover your nose and mouth with a scarf on cold or windy days.  Several health conditions can make asthma more difficult to manage, including runny nose, sinus infections, reflux disease, psychological stress, and sleep apnea. Your caregiver will treat these conditions, as well.  Avoid close contact with people who have a  cold or the flu, since your asthma symptoms may get worse if you catch the infection from them. Wash your hands thoroughly after touching items that may have been handled by people with a respiratory infection.  Get a flu shot every year to protect against the flu virus, which often makes asthma worse for days or weeks. Also get a pneumonia shot once every five to 10 years. Drugs:  Aspirin and other painkillers can cause asthma attacks. 10% to 20% of people with asthma have sensitivity to aspirin or a group of painkillers called non-steroidal anti-inflammatory drugs (NSAIDS), such as ibuprofen and naproxen. These drugs are used to treat pain and reduce fevers. Asthma attacks caused by any of these medicines can be severe and even fatal. These drugs must be avoided in people who have known aspirin sensitive asthma. Products with acetaminophen are considered safe for people who have asthma. It is important that people with aspirin sensitivity read labels of all over-the-counter drugs used to treat pain, colds, coughs, and fever.  Beta blockers and ACE inhibitors are other drugs which you should discuss with your caregiver, in relation to your asthma. ALLERGY SKIN TESTING  Ask your asthma caregiver about allergy skin testing or blood testing (RAST test) to identify the allergens to which you are sensitive. If you are found to have allergies, allergy shots (immunotherapy) for asthma may help prevent future allergies and asthma. With allergy shots, small doses of allergens (substances to which you are allergic) are injected under your skin on a regular schedule. Over a period of time, your body may become used to the allergen and less responsive with asthma symptoms. You can also take measures to minimize your exposure to those allergens. EXERCISE  If you have exercise-induced asthma, or are planning vigorous exercise, or exercise in cold, humid, or dry environments, prevent exercise-induced asthma by  following your caregiver's advice regarding asthma treatment before exercising. Document Released: 11/28/2009 Document Revised: 03/03/2012 Document Reviewed: 11/28/2009 [facility] Patient Information 2013 [place], [place].   Allergic Rhinitis Allergic rhinitis is when the mucous membranes in the nose respond to allergens. Allergens are particles in the air that cause your body to have an allergic reaction. This causes you to release allergic antibodies. Through a chain of events, these eventually cause you to release histamine into the blood stream (hence the use of antihistamines). Although meant to be protective to the body, it is this release that causes your discomfort, such as frequent sneezing, congestion and an itchy runny nose.  CAUSES  The pollen allergens may come from grasses, trees, and weeds. This is seasonal allergic rhinitis, or "hay fever." Other allergens cause year-round allergic rhinitis (perennial allergic rhinitis)  such as house dust mite allergen, pet dander and mold spores.  SYMPTOMS   Nasal stuffiness (congestion).  Runny, itchy nose with sneezing and tearing of the eyes.  There is often an itching of the mouth, eyes and ears. It cannot be cured, but it can be controlled with medications. DIAGNOSIS  If you are unable to determine the offending allergen, skin or blood testing may find it. TREATMENT   Avoid the allergen.  Medications and allergy shots (immunotherapy) can help.  Hay fever may often be treated with antihistamines in pill or nasal spray forms. Antihistamines block the effects of histamine. There are over-the-counter medicines that may help with nasal congestion and swelling around the eyes. Check with your caregiver before taking or giving this medicine. If the treatment above does not work, there are many new medications your caregiver can prescribe. Stronger medications may be used if initial measures are ineffective. Desensitizing injections can be used  if medications and avoidance fails. Desensitization is when a patient is given ongoing shots until the body becomes less sensitive to the allergen. Make sure you follow up with your caregiver if problems continue. SEEK MEDICAL CARE IF:   You develop fever (more than 100.5 F (38.1 C).  You develop a cough that does not stop easily (persistent).  You have shortness of breath.  You start wheezing.  Symptoms interfere with normal daily activities. Document Released: 09/04/2001 Document Revised: 03/03/2012 Document Reviewed: 03/16/2009 [facility] Patient Information 2013 [place], [place].

## 2013-09-15 ENCOUNTER — Encounter: Payer: Self-pay | Admitting: Internal Medicine

## 2013-09-15 NOTE — Progress Notes (Signed)
  Subjective:    Patient ID: Donna Spears, female    DOB: [birth year], 57 y.o.   MRN: [id number]  HPI 57 year old white female with history of GE reflux, depression, and hyperlipidemia in today for six-month recheck. No complaints or problems. Tolerate Lipitor without side effects. She is also on Pristiq for depression. Takes Nexium for GE reflux. Patient no longer works outside the home and seems less stressed over the past year. No new issues.    Review of Systems     Objective:   Physical Exam Neck is supple without thyromegaly JVD or carotid bruits. Chest clear to auscultation. Cardiac exam regular rate and rhythm normal S1 and S2. Extremities without edema. Skin is warm and dry. Lipid panel and liver functions both entirely within normal limits        Assessment & Plan:  History of depression-stable on pristiq  GE reflux-stable on Nexium  Hyperlipidemia-on statin therapy and lipids are normal as well as liver functions  Plan: Return in 6 months for physical exam or as needed.

## 2013-09-15 NOTE — Patient Instructions (Addendum)
Continue same medications and return in 6 months 

## 2013-09-29 ENCOUNTER — Other Ambulatory Visit: Payer: Self-pay | Admitting: Internal Medicine

## 2013-09-29 ENCOUNTER — Other Ambulatory Visit: Payer: 59 | Admitting: Internal Medicine

## 2013-09-29 DIAGNOSIS — Z0000 Encounter for general adult medical examination without abnormal findings: Secondary | ICD-10-CM

## 2013-09-29 DIAGNOSIS — Z1329 Encounter for screening for other suspected endocrine disorder: Secondary | ICD-10-CM

## 2013-09-29 DIAGNOSIS — E785 Hyperlipidemia, unspecified: Secondary | ICD-10-CM

## 2013-09-29 DIAGNOSIS — Z130 Encounter for screening for diseases of the blood and blood-forming organs and certain disorders involving the immune mechanism: Secondary | ICD-10-CM

## 2013-09-29 LAB — LIPID PANEL
HDL: 47 mg/dL (ref >39)
LDL Cholesterol: 119 mg/dL — ABNORMAL HIGH (ref 0–99)
Total CHOL/HDL Ratio: 4.3 Ratio

## 2013-09-29 LAB — CBC WITH DIFFERENTIAL/PLATELET
Basophils Absolute: 0 10*3/uL (ref 0.0–0.1)
Basophils Relative: 1 % (ref 0–1)
Eosinophils Relative: 1 % (ref 0–5)
HCT: 40.8 % (ref 36.0–46.0)
MCHC: 33.8 g/dL (ref 30.0–36.0)
MCV: 84.6 fL (ref 78.0–100.0)
Monocytes Absolute: 0.6 10*3/uL (ref 0.1–1.0)
RDW: 14.1 % (ref 11.5–15.5)

## 2013-09-29 LAB — COMPREHENSIVE METABOLIC PANEL
AST: 18 U/L (ref 0–37)
Alkaline Phosphatase: 101 U/L (ref 39–117)
BUN: 12 mg/dL (ref 6–23)
Creat: 0.59 mg/dL (ref 0.50–1.10)

## 2013-09-29 LAB — TSH: TSH: 1.509 u[IU]/mL (ref 0.350–4.500)

## 2013-09-30 LAB — VITAMIN D 25 HYDROXY (VIT D DEFICIENCY, FRACTURES): Vit D, 25-Hydroxy: 31 ng/mL (ref 30–89)

## 2013-10-01 ENCOUNTER — Encounter: Payer: Self-pay | Admitting: Internal Medicine

## 2013-10-01 ENCOUNTER — Ambulatory Visit (INDEPENDENT_AMBULATORY_CARE_PROVIDER_SITE_OTHER): Payer: 59 | Admitting: Internal Medicine

## 2013-10-01 VITALS — BP 102/68 | HR 76 | Temp 99.3°F | Ht 63.0 in | Wt 186.0 lb

## 2013-10-01 DIAGNOSIS — F32A Depression, unspecified: Secondary | ICD-10-CM

## 2013-10-01 DIAGNOSIS — Z23 Encounter for immunization: Secondary | ICD-10-CM

## 2013-10-01 DIAGNOSIS — F329 Major depressive disorder, single episode, unspecified: Secondary | ICD-10-CM

## 2013-10-01 DIAGNOSIS — K219 Gastro-esophageal reflux disease without esophagitis: Secondary | ICD-10-CM

## 2013-10-01 DIAGNOSIS — Z0000 Encounter for general adult medical examination without abnormal findings: Secondary | ICD-10-CM

## 2013-10-01 DIAGNOSIS — E785 Hyperlipidemia, unspecified: Secondary | ICD-10-CM

## 2013-10-01 DIAGNOSIS — J309 Allergic rhinitis, unspecified: Secondary | ICD-10-CM

## 2013-10-01 DIAGNOSIS — E669 Obesity, unspecified: Secondary | ICD-10-CM

## 2013-10-01 LAB — POCT URINALYSIS DIPSTICK
Bilirubin, UA: NEGATIVE
Blood, UA: NEGATIVE
Ketones, UA: NEGATIVE
Leukocytes, UA: NEGATIVE
Spec Grav, UA: 1.025
Urobilinogen, UA: 1
pH, UA: 6.5

## 2013-10-01 MED ORDER — NEXIUM 40 MG PO CPDR: DELAYED_RELEASE_CAPSULE | ORAL | Status: DC

## 2013-10-01 MED ORDER — ATORVASTATIN CALCIUM 40 MG PO TABS: 40.0000 mg | ORAL_TABLET | Freq: Every day | ORAL | Status: DC

## 2013-10-01 MED ORDER — PRISTIQ 50 MG PO TB24: ORAL_TABLET | ORAL | Status: DC

## 2013-10-01 NOTE — Patient Instructions (Addendum)
Diet exercise and lose weight. Return in 6 months. Eat better. Continue same medications. Order given for mammogram and bone density study

## 2013-10-01 NOTE — Progress Notes (Signed)
Subjective:    Patient ID: Donna Spears, female    DOB: 08-02-1956, 57 y.o.   MRN: [id number]  HPI 57 year old white female in today for health maintenance and evaluation of medical problems. Is a history of hyperlipidemia crit sees mixed) GE reflux, depression. Is on a statin medication but admits to not eating correctly. Lipid panel reflects poor eating habits. Continues to take Nexium for GE reflux. Takes Pristiqfor depression.  In 2013 was seen by GYN -Dr. Huel Cote and had Pap smear. Needs to go back for annual exam. Order given for mammogram. Also order given for bone density study.  Patient had colonoscopy by Dr. Ewing Schlein  12/31/2005  Had tetanus immunization update August 2013.  History of allergic rhinitis and fibrocystic breast disease.  No known drug allergies.  History of right trigger finger release by Dr. Mina Marble  Social history: Patient works in Designer, television/film set. Has a college degree. Is married to a Runner, broadcasting/film/video. Does not smoke or consume alcohol. She is now retired and keep grandchildren ages 1 and 80. She is walking a mile several times weekly. 2 adult children a son and a daughter.  Family history: Mother with history of heart disease and lung cancer as well as colon cancer. One brother with history of coronary artery disease.  Discussion with patient regarding diet exercise and weight loss and cardiac risk factors    Review of Systems  Constitutional: Positive for fatigue.  HENT: Negative.   Eyes: Negative.   Respiratory: Negative.   Cardiovascular: Negative for palpitations and leg swelling.       No chest pain  Gastrointestinal: Negative.   Endocrine: Negative.   Genitourinary: Negative.   Allergic/Immunologic: Positive for environmental allergies.  Neurological: Negative.   Hematological: Negative.   Psychiatric/Behavioral:       History of depression stable on antidepressant       Objective:   Physical Exam  Vitals  reviewed. Constitutional: She is oriented to person, place, and time. She appears well-developed and well-nourished. No distress.  HENT:  Head: Normocephalic and atraumatic.  Right Ear: External ear normal.  Left Ear: External ear normal.  Mouth/Throat: Oropharynx is clear and moist. No oropharyngeal exudate.  Eyes: Conjunctivae and EOM are normal. Pupils are equal, round, and reactive to light. Right eye exhibits no discharge. Left eye exhibits no discharge.  Neck: Neck supple. No JVD present. No thyromegaly present.  Cardiovascular: Normal rate, regular rhythm, normal heart sounds and intact distal pulses.   No murmur heard. Pulmonary/Chest: Effort normal and breath sounds normal. No respiratory distress. She has no wheezes. She has no rales. She exhibits no tenderness.  Breasts normal female without masses  Abdominal: Soft. Bowel sounds are normal. She exhibits no distension and no mass. There is no tenderness. There is no rebound and no guarding.  Genitourinary:  Deferred to GYN  Musculoskeletal: Normal range of motion. She exhibits no edema.  Lymphadenopathy:    She has no cervical adenopathy.  Neurological: She is alert and oriented to person, place, and time. She has normal reflexes. No cranial nerve deficit. Coordination normal.  Skin: Skin is warm and dry. No rash noted. She is not diaphoretic.  Psychiatric: She has a normal mood and affect. Her behavior is normal. Judgment and thought content normal.          Assessment & Plan:  Hyperlipidemia-lipid panel worse than previous lipid panel. Admits to not eating correctly. She is taking statin medication.  History of depression-stable on Pristiq  History  of allergic rhinitis for which she takes Zyrtec and uses Flonase nasal spray  History of GE reflux treated with Nexium  Plan: Return in 6 months. Encouraged diet exercise and weight loss. Upon return will have fasting lipid panel liver functions. Patient requests screening  for diabetes. Hemoglobin A1c will be added to previous labs

## 2013-10-02 NOTE — Progress Notes (Signed)
Patient informed. 

## 2014-02-18 ENCOUNTER — Other Ambulatory Visit: Payer: Self-pay

## 2014-02-18 DIAGNOSIS — Z1231 Encounter for screening mammogram for malignant neoplasm of breast: Secondary | ICD-10-CM

## 2014-03-15 ENCOUNTER — Ambulatory Visit: Payer: 59

## 2014-03-31 ENCOUNTER — Ambulatory Visit
Admission: RE | Admit: 2014-03-31 | Discharge: 2014-03-31 | Disposition: A | Discharge status: Home / Self Care | Payer: 59 | Source: Ambulatory Visit

## 2014-03-31 ENCOUNTER — Ambulatory Visit: Payer: 59

## 2014-03-31 DIAGNOSIS — Z1231 Encounter for screening mammogram for malignant neoplasm of breast: Secondary | ICD-10-CM

## 2014-04-01 ENCOUNTER — Other Ambulatory Visit: Payer: 59 | Admitting: Internal Medicine

## 2014-04-02 ENCOUNTER — Encounter: Payer: Self-pay | Admitting: Internal Medicine

## 2014-04-02 ENCOUNTER — Ambulatory Visit (INDEPENDENT_AMBULATORY_CARE_PROVIDER_SITE_OTHER): Payer: 59 | Admitting: Internal Medicine

## 2014-04-02 ENCOUNTER — Ambulatory Visit
Admission: RE | Admit: 2014-04-02 | Discharge: 2014-04-02 | Disposition: A | Discharge status: Home / Self Care | Payer: 59 | Source: Ambulatory Visit | Attending: Internal Medicine | Admitting: Internal Medicine

## 2014-04-02 VITALS — BP 128/84 | HR 72 | Wt 188.0 lb

## 2014-04-02 DIAGNOSIS — Z79899 Other long term (current) drug therapy: Secondary | ICD-10-CM

## 2014-04-02 DIAGNOSIS — M25579 Pain in unspecified ankle and joints of unspecified foot: Secondary | ICD-10-CM

## 2014-04-02 DIAGNOSIS — Z8659 Personal history of other mental and behavioral disorders: Secondary | ICD-10-CM

## 2014-04-02 DIAGNOSIS — E785 Hyperlipidemia, unspecified: Secondary | ICD-10-CM

## 2014-04-02 DIAGNOSIS — R7302 Impaired glucose tolerance (oral): Secondary | ICD-10-CM

## 2014-04-02 DIAGNOSIS — K219 Gastro-esophageal reflux disease without esophagitis: Secondary | ICD-10-CM

## 2014-04-02 DIAGNOSIS — R7303 Prediabetes: Secondary | ICD-10-CM

## 2014-04-02 DIAGNOSIS — R7309 Other abnormal glucose: Secondary | ICD-10-CM

## 2014-04-02 LAB — HEPATIC FUNCTION PANEL
ALBUMIN: 3.7 g/dL (ref 3.5–5.2)
ALT: 29 U/L (ref 0–35)
AST: 26 U/L (ref 0–37)
Alkaline Phosphatase: 89 U/L (ref 39–117)
Bilirubin, Direct: 0.1 mg/dL (ref 0.0–0.3)
Indirect Bilirubin: 0.5 mg/dL (ref 0.2–1.2)
Total Bilirubin: 0.6 mg/dL (ref 0.2–1.2)
Total Protein: 6 g/dL (ref 6.0–8.3)

## 2014-04-02 LAB — LIPID PANEL
CHOL/HDL RATIO: 4.2 ratio
Cholesterol: 160 mg/dL (ref 0–200)
HDL: 38 mg/dL — AB (ref >39)
LDL CALC: 102 mg/dL — AB (ref 0–99)
TRIGLYCERIDES: 98 mg/dL (ref <150)
VLDL: 20 mg/dL (ref 0–40)

## 2014-04-02 LAB — HEMOGLOBIN A1C
HEMOGLOBIN A1C: 5.9 % — AB (ref <5.7)
MEAN PLASMA GLUCOSE: 123 mg/dL — AB (ref <117)

## 2014-04-02 NOTE — Progress Notes (Signed)
   Subjective:    Patient ID: Donna MuscatDonna Jenning, female    DOB: 1956-02-12, 58 y.o.   MRN: [id number]  HPI 58 year old White female in today for six-month recheck for hyperlipidemia, depression, GE reflux.  New problem today: Has had some pain intermittently in her left foot. Has been doing a lot of gardening. Does not recall any injury specifically to foot. Foot has been hurting off and on for a few months. Pain seems to be over fourth metatarsal.   Continues to take Nexium for GE reflux. Have informed her today that Nexium is now available in generic form. She may wish to choose generic Nexium in the near future.  Depression stable on Pristiq   Hyperlipidemia-lipid panel liver functions drawn on generic Lipitor 40 mg daily  At physical exam in October 2014, hemoglobin A1c was 6.1%. This will be repeated today.   Review of Systems     Objective:   Physical Exam Tenderness over mid left fourth metatarsal. No redness or increased warmth to touch.       Assessment & Plan:  Left foot pain-? Occult stress fracture versus musculoskeletal pain  Hyperlipidemia  GE reflux  Impaired glucose tolerance  History of depression  Plan: Lipid panel liver functions pending. Return in 6 months for physical examination. Have x-ray of left foot. Hemoglobin A1c pending.

## 2014-04-02 NOTE — Patient Instructions (Signed)
To have x-ray of left foot. Lab work is pending. Return for physical exam in 6 months.

## 2014-05-04 ENCOUNTER — Other Ambulatory Visit: Payer: Self-pay

## 2014-05-04 ENCOUNTER — Telehealth: Payer: Self-pay | Admitting: Internal Medicine

## 2014-05-04 ENCOUNTER — Other Ambulatory Visit: Payer: Self-pay | Admitting: Internal Medicine

## 2014-05-04 MED ORDER — ESOMEPRAZOLE MAGNESIUM 40 MG PO CPDR: DELAYED_RELEASE_CAPSULE | ORAL | Status: DC

## 2014-05-04 NOTE — Telephone Encounter (Signed)
Advised patient there is no stress fracture; likely muskuloskeletal in nature.  Patient verbalized understanding of these instructions and will call us back in 2 weeks once she has been on the OTC NSAIDS.

## 2014-07-03 ENCOUNTER — Ambulatory Visit (INDEPENDENT_AMBULATORY_CARE_PROVIDER_SITE_OTHER): Payer: 59 | Admitting: Internal Medicine

## 2014-07-03 VITALS — Temp 98.3°F

## 2014-07-03 DIAGNOSIS — S61409A Unspecified open wound of unspecified hand, initial encounter: Secondary | ICD-10-CM

## 2014-07-03 DIAGNOSIS — S61451A Open bite of right hand, initial encounter: Secondary | ICD-10-CM

## 2014-07-03 DIAGNOSIS — W540XXA Bitten by dog, initial encounter: Secondary | ICD-10-CM

## 2014-07-05 ENCOUNTER — Encounter: Payer: Self-pay | Admitting: Internal Medicine

## 2014-07-05 NOTE — Progress Notes (Signed)
   Subjective:    Patient ID: Donna MuscatDonna Miu, female    DOB: 02-Jun-1956, 58 y.o.   MRN: [id number]  HPI  This right-handed female was bitten by a dog on Thursday evening July 9. This was her neighbor's dog and she was caring for elderly neighbor some tomatoes. She reached out to pack the dog and it bit her. She has bites on the dorsal aspect of her right hand. She did not seek medical treatment until this morning when she called me. I advised her to be seen in the office. Her tetanus immunization is up-to-date.    Review of Systems     Objective:   Physical Exam She has a half inch laceration mid dorsum of her hand. She has some erythema about that area as well as a couple of puncture wounds with erythema. No pus draining from these wounds. Hand is a bit swollen. Neuromotor- vascular exam intact right hand.       Assessment & Plan:  Dog bites right hand  Plan: Augmentin 500 mg 3 times a day for 10 days. Soak hand in warm soapy water for 20 minutes twice a day. Dry well. Apply peroxide and then Bactroban ointment and keep likely bandaged. With regard to rabies vaccination, she does not know if dog has had up-to-date rabies immunization and she will contact under to find this out immediately. Spoke with her about calling animal control for further guidance.  Addendum: She called late afternoon and said dog had rabies vaccination that apparently became out of date in April. I spoke with infectious disease consultant Dr. Daiva EvesVan Dam who thinks this is probably not a rabid animal. Patient was told this information. She can decide whether to report this to Animal Control or not. In my opinion the dog should be Inside and observed for 10 days.  Over 25 minutes spent with patient with office visit in phone calls. Followup with me in 7-10 days.

## 2014-07-05 NOTE — Patient Instructions (Signed)
Take Augmentin 500 mg 3 times daily as directed. Followup in 7 days. Tetanus immunization is up-to-date. Soaked bites in warm soapy water 20 minutes twice daily. Dry well. Apply peroxide and Bactroban ointment and keep covered.

## 2014-08-09 ENCOUNTER — Telehealth: Payer: Self-pay | Admitting: Internal Medicine

## 2014-08-09 NOTE — Telephone Encounter (Signed)
Refer to orthopedist of choice.

## 2014-08-10 NOTE — Telephone Encounter (Signed)
Patient called stating she didn't hear back from our office.  Notes by Dr. Lenord FellersBaxley advise to refer patient to orthopedist of her choice.  Did advise patient of this this a.m. @ 0920 when she called.  Advised Bonita QuinLinda is out of the office and will work on getting this appointment when she returns.  Patient will take the recommendation of our choice.  Please make appointment and call patient with date/time.

## 2014-08-10 NOTE — Telephone Encounter (Signed)
.  left message to have patient return my call.  per Dr. Lenord FellersBaxley pt can call [facility] [phone]

## 2014-08-13 NOTE — Telephone Encounter (Signed)
Patient informed. 

## 2014-09-10 ENCOUNTER — Ambulatory Visit (INDEPENDENT_AMBULATORY_CARE_PROVIDER_SITE_OTHER): Payer: 59 | Admitting: Family Medicine

## 2014-09-10 VITALS — BP 117/80 | HR 70 | Temp 97.5°F | Resp 16 | Ht 64.0 in | Wt 185.0 lb

## 2014-09-10 DIAGNOSIS — R05 Cough: Secondary | ICD-10-CM

## 2014-09-10 DIAGNOSIS — R059 Cough, unspecified: Secondary | ICD-10-CM

## 2014-09-10 DIAGNOSIS — J301 Allergic rhinitis due to pollen: Secondary | ICD-10-CM

## 2014-09-10 DIAGNOSIS — J45901 Unspecified asthma with (acute) exacerbation: Secondary | ICD-10-CM

## 2014-09-10 MED ORDER — HYDROCOD POLST-CHLORPHEN POLST 10-8 MG/5ML PO LQCR: 5.0000 mL | Freq: Every evening | ORAL | Status: DC | PRN

## 2014-09-10 MED ORDER — BECLOMETHASONE DIPROPIONATE 80 MCG/ACT IN AERS: 1.0000 | INHALATION_SPRAY | Freq: Two times a day (BID) | RESPIRATORY_TRACT | Status: DC

## 2014-09-10 MED ORDER — PREDNISONE 20 MG PO TABS: ORAL_TABLET | ORAL | Status: DC

## 2014-09-10 MED ORDER — FLUTICASONE PROPIONATE 50 MCG/ACT NA SUSP: 2.0000 | Freq: Every day | NASAL | Status: DC

## 2014-09-10 NOTE — Progress Notes (Signed)
   Subjective:    Patient ID: Donna Spears, female    DOB: 1956/01/22, 58 y.o.   MRN: [id number]  HPI Patient presents today with 2 weeks of cough and nasal congestion, sore throat. She has a history of asthma and seasonal allergies and has been prescribed Dulera. She has been taking this occasionally PRN along with her albuterol inhaler. She has a flare/bronchitis several times a year. Usually in the fall and spring. She is contemplating allergy shots which she has had in the past. She has an appointment with an allergist next month. She used flonase occasionally in the past.   Has tried dayquil/nightquil with some relief. She has used tussinoex in the past with good relief of night time cough.   Review of Systems No fever, no chills, clear sputum, fatigued, no wheezing, occasional rattling when lying down, no SOB at rest, some with activity.      Objective:   Physical Exam  Vitals reviewed. Constitutional: She is oriented to person, place, and time. She appears well-developed and well-nourished.  HENT:  Head: Normocephalic and atraumatic.  Right Ear: Tympanic membrane, external ear and ear canal normal.  Left Ear: Tympanic membrane, external ear and ear canal normal.  Nose: Mucosal edema and rhinorrhea present. Right sinus exhibits no maxillary sinus tenderness and no frontal sinus tenderness. Left sinus exhibits no maxillary sinus tenderness and no frontal sinus tenderness.  Mouth/Throat: Uvula is midline, oropharynx is clear and moist and mucous membranes are normal.  Cardiovascular: Normal rate, regular rhythm and normal heart sounds.   Pulmonary/Chest: Effort normal and breath sounds normal.  Musculoskeletal: Normal range of motion.  Neurological: She is alert and oriented to person, place, and time.  Skin: Skin is warm and dry.  Psychiatric: She has a normal mood and affect. Her behavior is normal. Judgment and thought content normal.   PF- 300 - 68% of predicted    Assessment &  Plan:  1. Asthma with acute exacerbation, unspecified asthma severity - predniSONE (DELTASONE) 20 MG tablet; Take 3 tablets for 3 days, 2 tablets for 3 days, 1 tablet for 3 days  Dispense: 18 tablet; Refill: 0 - beclomethasone (QVAR) 80 MCG/ACT inhaler; Inhale 1 puff into the lungs 2 (two) times daily. Rinse mouth after using.  Dispense: 1 Inhaler; Refill: 12 -Provided written and verbal information regarding diagnosis and treatment. -discussed asthma/allergies and need for continuous treatment to decrease inflammation and prevent chronic problems -She will follow up next month with me or with her allergist. She is to come in sooner if she worsens.   2. Allergic rhinitis due to pollen -provided information about OTC symptomatic relief - fluticasone (FLONASE) 50 MCG/ACT nasal spray; Place 2 sprays into both nostrils daily.  Dispense: 16 g; Refill: 6  3. Cough - chlorpheniramine-HYDROcodone (TUSSIONEX PENNKINETIC ER) 10-8 MG/5ML LQCR; Take 5 mLs by mouth at bedtime as needed.  Dispense: 70 mL; Refill: 0   Emi Belfast, FNP-BC  [facility]  09/12/2014 11:27 AM

## 2014-09-10 NOTE — Patient Instructions (Signed)
Can use afrin nasal spray for up to 4 days- use prior to using flonase Follow up with me or with allergy next month, sooner if no improvement.   Allergic Rhinitis Allergic rhinitis is when the mucous membranes in the nose respond to allergens. Allergens are particles in the air that cause your body to have an allergic reaction. This causes you to release allergic antibodies. Through a chain of events, these eventually cause you to release histamine into the blood stream. Although meant to protect the body, it is this release of histamine that causes your discomfort, such as frequent sneezing, congestion, and an itchy, runny nose.  CAUSES  Seasonal allergic rhinitis (hay fever) is caused by pollen allergens that may come from grasses, trees, and weeds. Year-round allergic rhinitis (perennial allergic rhinitis) is caused by allergens such as house dust mites, pet dander, and mold spores.  SYMPTOMS   Nasal stuffiness (congestion).  Itchy, runny nose with sneezing and tearing of the eyes. DIAGNOSIS  Your health care provider can help you determine the allergen or allergens that trigger your symptoms. If you and your health care provider are unable to determine the allergen, skin or blood testing may be used. TREATMENT  Allergic rhinitis does not have a cure, but it can be controlled by:  Medicines and allergy shots (immunotherapy).  Avoiding the allergen. Hay fever may often be treated with antihistamines in pill or nasal spray forms. Antihistamines block the effects of histamine. There are over-the-counter medicines that may help with nasal congestion and swelling around the eyes. Check with your health care provider before taking or giving this medicine.  If avoiding the allergen or the medicine prescribed do not work, there are many new medicines your health care provider can prescribe. Stronger medicine may be used if initial measures are ineffective. Desensitizing injections can be used if  medicine and avoidance does not work. Desensitization is when a patient is given ongoing shots until the body becomes less sensitive to the allergen. Make sure you follow up with your health care provider if problems continue. HOME CARE INSTRUCTIONS It is not possible to completely avoid allergens, but you can reduce your symptoms by taking steps to limit your exposure to them. It helps to know exactly what you are allergic to so that you can avoid your specific triggers. SEEK MEDICAL CARE IF:   You have a fever.  You develop a cough that does not stop easily (persistent).  You have shortness of breath.  You start wheezing.  Symptoms interfere with normal daily activities. Document Released: 09/04/2001 Document Revised: 12/15/2013 Document Reviewed: 08/17/2013 [facility] Patient Information 2015 [place], [place]. This information is not intended to replace advice given to you by your health care provider. Make sure you discuss any questions you have with your health care provider. Asthma Asthma is a recurring condition in which the airways tighten and narrow. Asthma can make it difficult to breathe. It can cause coughing, wheezing, and shortness of breath. Asthma episodes, also called asthma attacks, range from minor to life-threatening. Asthma cannot be cured, but medicines and lifestyle changes can help control it. CAUSES Asthma is believed to be caused by inherited (genetic) and environmental factors, but its exact cause is unknown. Asthma may be triggered by allergens, lung infections, or irritants in the air. Asthma triggers are different for each person. Common triggers include:   Animal dander.  Dust mites.  Cockroaches.  Pollen from trees or grass.  Mold.  Smoke.  Air pollutants such as  dust, household cleaners, hair sprays, aerosol sprays, paint fumes, strong chemicals, or strong odors.  Cold air, weather changes, and winds (which increase molds and pollens in the  air).  Strong emotional expressions such as crying or laughing hard.  Stress.  Certain medicines (such as aspirin) or types of drugs (such as beta-blockers).  Sulfites in foods and drinks. Foods and drinks that may contain sulfites include dried fruit, potato chips, and sparkling grape juice.  Infections or inflammatory conditions such as the flu, a cold, or an inflammation of the nasal membranes (rhinitis).  Gastroesophageal reflux disease (GERD).  Exercise or strenuous activity. SYMPTOMS Symptoms may occur immediately after asthma is triggered or many hours later. Symptoms include:  Wheezing.  Excessive nighttime or early morning coughing.  Frequent or severe coughing with a common cold.  Chest tightness.  Shortness of breath. DIAGNOSIS  The diagnosis of asthma is made by a review of your medical history and a physical exam. Tests may also be performed. These may include:  Lung function studies. These tests show how much air you breathe in and out.  Allergy tests.  Imaging tests such as X-rays. TREATMENT  Asthma cannot be cured, but it can usually be controlled. Treatment involves identifying and avoiding your asthma triggers. It also involves medicines. There are 2 classes of medicine used for asthma treatment:   Controller medicines. These prevent asthma symptoms from occurring. They are usually taken every day.  Reliever or rescue medicines. These quickly relieve asthma symptoms. They are used as needed and provide short-term relief. Your health care provider will help you create an asthma action plan. An asthma action plan is a written plan for managing and treating your asthma attacks. It includes a list of your asthma triggers and how they may be avoided. It also includes information on when medicines should be taken and when their dosage should be changed. An action plan may also involve the use of a device called a peak flow meter. A peak flow meter measures how  well the lungs are working. It helps you monitor your condition. HOME CARE INSTRUCTIONS   Take medicines only as directed by your health care provider. Speak with your health care provider if you have questions about how or when to take the medicines.  Use a peak flow meter as directed by your health care provider. Record and keep track of readings.  Understand and use the action plan to help minimize or stop an asthma attack without needing to seek medical care.  Control your home environment in the following ways to help prevent asthma attacks:  Do not smoke. Avoid being exposed to secondhand smoke.  Change your heating and air conditioning filter regularly.  Limit your use of fireplaces and wood stoves.  Get rid of pests (such as roaches and mice) and their droppings.  Throw away plants if you see mold on them.  Clean your floors and dust regularly. Use unscented cleaning products.  Try to have someone else vacuum for you regularly. Stay out of rooms while they are being vacuumed and for a short while afterward. If you vacuum, use a dust mask from a hardware store, a double-layered or microfilter vacuum cleaner bag, or a vacuum cleaner with a HEPA filter.  Replace carpet with wood, tile, or vinyl flooring. Carpet can trap dander and dust.  Use allergy-proof pillows, mattress covers, and box spring covers.  Wash bed sheets and blankets every week in hot water and dry them in a dryer.  Use blankets that are made of polyester or cotton.  Clean bathrooms and kitchens with bleach. If possible, have someone repaint the walls in these rooms with mold-resistant paint. Keep out of the rooms that are being cleaned and painted.  Wash hands frequently. SEEK MEDICAL CARE IF:   You have wheezing, shortness of breath, or a cough even if taking medicine to prevent attacks.  The colored mucus you cough up (sputum) is thicker than usual.  Your sputum changes from clear or white to yellow,  green, gray, or bloody.  You have any problems that may be related to the medicines you are taking (such as a rash, itching, swelling, or trouble breathing).  You are using a reliever medicine more than 2-3 times per week.  Your peak flow is still at 50-79% of your personal best after following your action plan for 1 hour.  You have a fever. SEEK IMMEDIATE MEDICAL CARE IF:   You seem to be getting worse and are unresponsive to treatment during an asthma attack.  You are short of breath even at rest.  You get short of breath when doing very little physical activity.  You have difficulty eating, drinking, or talking due to asthma symptoms.  You develop chest pain.  You develop a fast heartbeat.  You have a bluish color to your lips or fingernails.  You are light-headed, dizzy, or faint.  Your peak flow is less than 50% of your personal best. MAKE SURE YOU:   Understand these instructions.  Will watch your condition.  Will get help right away if you are not doing well or get worse. Document Released: 12/10/2005 Document Revised: 04/26/2014 Document Reviewed: 07/09/2013 [facility] Patient Information 2015 [place], [place]. This information is not intended to replace advice given to you by your health care provider. Make sure you discuss any questions you have with your health care provider.

## 2014-10-18 ENCOUNTER — Other Ambulatory Visit: Payer: Self-pay | Admitting: Internal Medicine

## 2014-10-18 ENCOUNTER — Other Ambulatory Visit: Payer: 59 | Admitting: Internal Medicine

## 2014-10-18 DIAGNOSIS — E782 Mixed hyperlipidemia: Secondary | ICD-10-CM

## 2014-10-18 DIAGNOSIS — Z0000 Encounter for general adult medical examination without abnormal findings: Secondary | ICD-10-CM

## 2014-10-18 DIAGNOSIS — Z130 Encounter for screening for diseases of the blood and blood-forming organs and certain disorders involving the immune mechanism: Secondary | ICD-10-CM

## 2014-10-18 DIAGNOSIS — Z1329 Encounter for screening for other suspected endocrine disorder: Secondary | ICD-10-CM

## 2014-10-18 DIAGNOSIS — Z13220 Encounter for screening for lipoid disorders: Secondary | ICD-10-CM

## 2014-10-18 DIAGNOSIS — Z1321 Encounter for screening for nutritional disorder: Secondary | ICD-10-CM

## 2014-10-18 LAB — CBC WITH DIFFERENTIAL/PLATELET
BASOS ABS: 0 10*3/uL (ref 0.0–0.1)
Basophils Relative: 0 % (ref 0–1)
EOS PCT: 1 % (ref 0–5)
Eosinophils Absolute: 0.1 10*3/uL (ref 0.0–0.7)
HCT: 42.6 % (ref 36.0–46.0)
Hemoglobin: 13.8 g/dL (ref 12.0–15.0)
LYMPHS PCT: 34 % (ref 12–46)
Lymphs Abs: 1.8 10*3/uL (ref 0.7–4.0)
MCH: 28.3 pg (ref 26.0–34.0)
MCHC: 32.4 g/dL (ref 30.0–36.0)
MCV: 87.5 fL (ref 78.0–100.0)
Monocytes Absolute: 0.6 10*3/uL (ref 0.1–1.0)
Monocytes Relative: 11 % (ref 3–12)
Neutro Abs: 2.9 10*3/uL (ref 1.7–7.7)
Neutrophils Relative %: 54 % (ref 43–77)
PLATELETS: 203 10*3/uL (ref 150–400)
RBC: 4.87 MIL/uL (ref 3.87–5.11)
RDW: 13.6 % (ref 11.5–15.5)
WBC: 5.4 10*3/uL (ref 4.0–10.5)

## 2014-10-18 LAB — COMPREHENSIVE METABOLIC PANEL
ALBUMIN: 4.1 g/dL (ref 3.5–5.2)
ALK PHOS: 105 U/L (ref 39–117)
ALT: 29 U/L (ref 0–35)
AST: 19 U/L (ref 0–37)
BUN: 13 mg/dL (ref 6–23)
CO2: 29 meq/L (ref 19–32)
Calcium: 9.1 mg/dL (ref 8.4–10.5)
Chloride: 106 mEq/L (ref 96–112)
Creat: 0.67 mg/dL (ref 0.50–1.10)
GLUCOSE: 105 mg/dL — AB (ref 70–99)
Potassium: 4.5 mEq/L (ref 3.5–5.3)
Sodium: 142 mEq/L (ref 135–145)
TOTAL PROTEIN: 6.6 g/dL (ref 6.0–8.3)
Total Bilirubin: 0.3 mg/dL (ref 0.2–1.2)

## 2014-10-18 LAB — LIPID PANEL
CHOLESTEROL: 188 mg/dL (ref 0–200)
HDL: 46 mg/dL (ref >39)
LDL Cholesterol: 113 mg/dL — ABNORMAL HIGH (ref 0–99)
Total CHOL/HDL Ratio: 4.1 Ratio
Triglycerides: 147 mg/dL (ref <150)
VLDL: 29 mg/dL (ref 0–40)

## 2014-10-18 LAB — TSH: TSH: 2.64 u[IU]/mL (ref 0.350–4.500)

## 2014-10-19 LAB — VITAMIN D 25 HYDROXY (VIT D DEFICIENCY, FRACTURES): VIT D 25 HYDROXY: 33 ng/mL (ref 30–89)

## 2014-10-21 ENCOUNTER — Telehealth: Payer: Self-pay

## 2014-10-21 ENCOUNTER — Ambulatory Visit (INDEPENDENT_AMBULATORY_CARE_PROVIDER_SITE_OTHER): Payer: 59 | Admitting: Internal Medicine

## 2014-10-21 VITALS — BP 108/72 | HR 79 | Temp 98.3°F | Wt 186.0 lb

## 2014-10-21 DIAGNOSIS — Z8659 Personal history of other mental and behavioral disorders: Secondary | ICD-10-CM

## 2014-10-21 DIAGNOSIS — Z23 Encounter for immunization: Secondary | ICD-10-CM

## 2014-10-21 DIAGNOSIS — J309 Allergic rhinitis, unspecified: Secondary | ICD-10-CM

## 2014-10-21 DIAGNOSIS — R197 Diarrhea, unspecified: Secondary | ICD-10-CM

## 2014-10-21 DIAGNOSIS — E785 Hyperlipidemia, unspecified: Secondary | ICD-10-CM

## 2014-10-21 DIAGNOSIS — K219 Gastro-esophageal reflux disease without esophagitis: Secondary | ICD-10-CM

## 2014-10-21 DIAGNOSIS — Z0000 Encounter for general adult medical examination without abnormal findings: Secondary | ICD-10-CM

## 2014-10-21 LAB — POCT URINALYSIS DIPSTICK
Bilirubin, UA: NEGATIVE
Blood, UA: NEGATIVE
Glucose, UA: NEGATIVE
Ketones, UA: NEGATIVE
Leukocytes, UA: NEGATIVE
Nitrite, UA: NEGATIVE
PROTEIN UA: NEGATIVE
SPEC GRAV UA: 1.01
Urobilinogen, UA: NEGATIVE
pH, UA: 6

## 2014-10-21 MED ORDER — HYOSCYAMINE SULFATE 0.125 MG SL SUBL: SUBLINGUAL_TABLET | SUBLINGUAL | Status: DC

## 2014-10-21 NOTE — Telephone Encounter (Signed)
Call to [facility] to add antigliadin antibioties.

## 2014-10-21 NOTE — Patient Instructions (Signed)
To submit stool studies. Take Levsin one half hour before meals. C. difficile toxin will be checked. Return in 4 weeks. Continue same medications previously prescribed.

## 2014-10-22 LAB — FECAL LACTOFERRIN, QUANT: Lactoferrin: POSITIVE

## 2014-10-25 LAB — GLIADIN ANTIBODIES, SERUM
GLIADIN IGA: 9 U/mL (ref <20)
Gliadin IgG: 4.9 U/mL (ref <20)

## 2014-10-25 LAB — OVA AND PARASITE EXAMINATION: OP: NONE SEEN

## 2014-10-26 ENCOUNTER — Telehealth: Payer: Self-pay

## 2014-10-26 LAB — STOOL CULTURE

## 2014-10-26 NOTE — Telephone Encounter (Signed)
Called [facility] to follow up on stool labs.  They did not receive request for all the labs.  Called [facility] to see if they received the sample.  They do not have any record of order or specimen.  Called [facility] back and tried adding fecal leukocytes and cdifficle to order, they were unable to add due to age of specimen.

## 2014-10-26 NOTE — Telephone Encounter (Signed)
Tried calling patient on her cell but unable to leave message.  Left message with a man at home number for patient to call office.  We need her to bring another stool sample in to have additional test done.

## 2014-11-12 ENCOUNTER — Other Ambulatory Visit: Payer: Self-pay | Admitting: Internal Medicine

## 2014-11-16 ENCOUNTER — Telehealth: Payer: Self-pay | Admitting: Internal Medicine

## 2014-11-16 NOTE — Telephone Encounter (Signed)
Patient is calling to make sure that her Pristiq and her Lipitor have been refilled to [facility] (her mailorder) because she is running out.  Can you verify for sure?  States she is running out and wants to make sure before the holiday.

## 2014-11-16 NOTE — Telephone Encounter (Signed)
Patient informed that medications have been sent to [facility].  She has a follow up in December 3 at 1045.

## 2014-11-25 ENCOUNTER — Ambulatory Visit (INDEPENDENT_AMBULATORY_CARE_PROVIDER_SITE_OTHER): Payer: 59 | Admitting: Internal Medicine

## 2014-11-25 ENCOUNTER — Encounter: Payer: Self-pay | Admitting: Internal Medicine

## 2014-11-25 VITALS — BP 118/84 | HR 75 | Temp 99.0°F | Ht 64.0 in | Wt 189.0 lb

## 2014-11-25 DIAGNOSIS — R197 Diarrhea, unspecified: Secondary | ICD-10-CM

## 2014-11-25 NOTE — Patient Instructions (Signed)
Finish submitting stool studies including fecal leukocytes, ova and parasites. Appointment with Dr. Ewing SchleinMagod.

## 2014-11-25 NOTE — Progress Notes (Signed)
   Subjective:    Patient ID: Donna Spears, female    DOB: 11-03-1956, 58 y.o.   MRN: [id number]  HPI  Issue with frequent bowel movements for about year. Father had hx of some type of colitis. She is not sure of the exact diagnosis. Never had bowel surgery. Pt was given Levsin to try which may have helped some. In October she was to submit stool studies. Apparently lab did not receive all of the request. Stool culture was negative. She was asked to resubmit but her mailbox was full only had difficulty reaching her. She comes in today to discuss this issue. Thinks she may have some lactose intolerance as well.       Review of Systems     Objective:   Physical Exam  Spent 15 minutes taken with patient about issue. Suggest gastroenterology consultation if symptoms persist. She will resubmit stool studies.      Assessment & Plan:  Diarrhea  Plan: Resubmit stool studies and if symptoms persist despite trial of lactose-free diet, refer to gastroenterologist  Addendum: Stool is positive for lactoferrin. Negative for Clostridium difficile.  See Dr. Ewing SchleinMagod if necessary

## 2014-12-14 ENCOUNTER — Other Ambulatory Visit: Payer: 59 | Admitting: Internal Medicine

## 2014-12-14 DIAGNOSIS — R197 Diarrhea, unspecified: Secondary | ICD-10-CM

## 2014-12-16 LAB — FECAL LACTOFERRIN, QUANT: Lactoferrin: POSITIVE

## 2014-12-18 LAB — STOOL CULTURE

## 2014-12-22 ENCOUNTER — Telehealth: Payer: Self-pay | Admitting: *Deleted

## 2014-12-22 LAB — CLOSTRIDIUM DIFFICILE CULTURE-FECAL

## 2014-12-22 NOTE — Progress Notes (Signed)
According to [facility] this lab is sent out to [facility] and due to Holiday there has been a delay in processing they will follow up on this

## 2014-12-22 NOTE — Telephone Encounter (Signed)
Spoke with patient she states she is doing better she has cut daily products out and states she has had an improvement .She will call us back if symptoms worsen.I let patient know we will notify her regarding stool lab results when they are finalized.

## 2014-12-22 NOTE — Telephone Encounter (Signed)
Stool culture is negative. Lactoferrin is positive which would suggest colitis. We need to call lab pleae to find out what happened with C. Diff and fecal leukocyes specimens that were sent. No results in EPIC and these tests do not take long to perform.

## 2014-12-23 NOTE — Progress Notes (Signed)
Lab results for C-Diff resulted and ready for review

## 2015-01-01 ENCOUNTER — Encounter: Payer: Self-pay | Admitting: Internal Medicine

## 2015-01-01 NOTE — Progress Notes (Signed)
   Subjective:    Patient ID: Donna Spears, female    DOB: [birth year], 59 y.o.   MRN: [id number]  HPI  59 year old female in today for health maintenance exam and evaluation of medical issues. She's having some issues with diarrhea. Not sure if she has lactose intolerance. She has a history of hyperlipidemia, GE reflux, depression. She is on statin medication. History of GE reflux for which she takes Nexium. Takes Pristiq for depression.  Dr. Huel CoteKathy Richardson is GYN physician.  Colonoscopy by Dr. Ewing SchleinMagod 2007.  Tetanus immunization August 2013.  No known drug allergies  History of allergic rhinitis and fibrocystic breast disease. Had right trigger finger release by Dr. Mina MarbleWeingold.  Social history: She formerly [place] as a [place]nel [place] Has a college degree and is married to a Runner, broadcasting/film/videoteacher. Does not smoke or consume alcohol. She is now retired and keeps grandchildren. Walks several miles weekly. 2 adult children, a son and a daughter.   Family history: Mother with history of heart disease and lung cancer as well as colon cancer. One brother with history of coronary artery disease.  This summer she had a dog bite on her right hand and Rabies immunization on the dog had expired just a few months earlier. It was a neighbor's dog. We checked with infectious disease consultant and it was felt that rabies immunization was not necessary for this patient.  Review of Systems  frequent episodes of diarrhea. Needs to submit stool studies. Needs to see if lactose intolerant.     Objective:   Physical Exam  Constitutional: She is oriented to person, place, and time. She appears well-developed and well-nourished. No distress.  HENT:  Head: Normocephalic and atraumatic.  Right Ear: External ear normal.  Left Ear: External ear normal.  Mouth/Throat: Oropharynx is clear and moist. No oropharyngeal exudate.  Eyes: Conjunctivae and EOM are normal. Pupils are equal, round, and reactive to  light. Right eye exhibits no discharge. Left eye exhibits no discharge.  Neck: Neck supple. No JVD present. No thyromegaly present.  Cardiovascular: Normal rate, regular rhythm and normal heart sounds.   No murmur heard. Pulmonary/Chest: Effort normal and breath sounds normal. No respiratory distress. She has no wheezes. She has no rales.  Breasts normal female  Abdominal: Soft. Bowel sounds are normal. She exhibits no distension and no mass. There is no tenderness. There is no rebound and no guarding.  Genitourinary:  Deferred to GYN  Musculoskeletal: Normal range of motion. She exhibits no edema.  Lymphadenopathy:    She has no cervical adenopathy.  Neurological: She is alert and oriented to person, place, and time. She has normal reflexes. No cranial nerve deficit. Coordination normal.  Skin: Skin is warm and dry. No rash noted. She is not diaphoretic. No erythema.  Psychiatric: She has a normal mood and affect. Her behavior is normal. Judgment and thought content normal.  Vitals reviewed.         Assessment & Plan:  Hyperlipidemia-treated with statin  History of depression-treated with SSRI  History of allergic rhinitis-treated with over-the-counter antihistaminic  History of GE reflux treated with Nexium  New complaint of diarrhea which is fairly frequent-daily. No blood in stool or melena. To try Levsin sublingual before meals  Plan: Patient to submit stool studies and advise further after these results are back. Try limiting dairy products to see if that makes a difference.

## 2015-03-08 ENCOUNTER — Ambulatory Visit (INDEPENDENT_AMBULATORY_CARE_PROVIDER_SITE_OTHER): Payer: 59 | Admitting: Family Medicine

## 2015-03-08 ENCOUNTER — Telehealth: Payer: Self-pay

## 2015-03-08 ENCOUNTER — Encounter: Payer: Self-pay | Admitting: Family Medicine

## 2015-03-08 VITALS — BP 132/84 | HR 84 | Temp 98.1°F | Resp 16 | Ht 65.0 in | Wt 190.0 lb

## 2015-03-08 DIAGNOSIS — R05 Cough: Secondary | ICD-10-CM

## 2015-03-08 DIAGNOSIS — R059 Cough, unspecified: Secondary | ICD-10-CM

## 2015-03-08 DIAGNOSIS — J302 Other seasonal allergic rhinitis: Secondary | ICD-10-CM | POA: Diagnosis not present

## 2015-03-08 DIAGNOSIS — L0292 Furuncle, unspecified: Secondary | ICD-10-CM

## 2015-03-08 MED ORDER — ALBUTEROL SULFATE HFA 108 (90 BASE) MCG/ACT IN AERS: 2.0000 | INHALATION_SPRAY | RESPIRATORY_TRACT | Status: DC | PRN

## 2015-03-08 MED ORDER — CEPHALEXIN 500 MG PO CAPS: 500.0000 mg | ORAL_CAPSULE | Freq: Three times a day (TID) | ORAL | Status: DC

## 2015-03-08 MED ORDER — HYDROCOD POLST-CHLORPHEN POLST 10-8 MG/5ML PO LQCR: 5.0000 mL | Freq: Every evening | ORAL | Status: DC | PRN

## 2015-03-08 NOTE — Progress Notes (Signed)
Subjective:    Patient ID: Donna Spears, female    DOB: 22-Mar-1956, 59 y.o.   MRN: [id number]  HPI This is a pleasant 59 yo female who presents today with 3 days of cough. Occasionally productive of green sputum, this is primarily in the morning. She has been doing better over all on Qvar and flonase. Saline nose spray several times a day with good relief. Has not been using albuterol. A little confused about difference between Qvar and albuterol. She has been using Mucinex D bid with good relief of nasal congestion, but is having difficulty sleeping. She feels that her symptoms are improving and is mostly irritated by cough and runny nose.   She has had an area of redness and pain on upper, inner right thigh for 1-2 weeks. No drainage. Has not gotten significantly larger. Hurts when she walks and her thighs rub together.   She is going on a month long cruise with her sister in October and will need some guidance on travel health.   Review of Systems No fever/chills, + wheezing, cough, clear nasal drainage, + sore throat, + post nasal drainage    Objective:   Physical Exam  Constitutional: She appears well-developed and well-nourished.  HENT:  Head: Normocephalic and atraumatic.  Right Ear: Tympanic membrane, external ear and ear canal normal.  Left Ear: Tympanic membrane, external ear and ear canal normal.  Nose: Mucosal edema (significantly improved from last exam) and rhinorrhea present. Right sinus exhibits no maxillary sinus tenderness and no frontal sinus tenderness. Left sinus exhibits no maxillary sinus tenderness and no frontal sinus tenderness.  Mouth/Throat: Uvula is midline, oropharynx is clear and moist and mucous membranes are normal. No oropharyngeal exudate.  Eyes: Conjunctivae are normal.  Neck: Normal range of motion. Neck supple.  Cardiovascular: Normal rate, regular rhythm and normal heart sounds.   Pulmonary/Chest: Effort normal and breath sounds normal.    Musculoskeletal: Normal range of motion.  Lymphadenopathy:    She has no cervical adenopathy.  Skin: Skin is warm and dry.  Right upper inner thigh with approximately 5 cm area erythema and edema. No drainage, not warm to touch. Fairly soft, tender.   Vitals reviewed. BP 132/84 mmHg  Pulse 84  Temp(Src) 98.1 F (36.7 C) (Oral)  Resp 16  Ht  (1.651 m)  Wt 190 lb (86.183 kg)  BMI 31.62 kg/m2  SpO2 97%     Assessment & Plan:  1. Boil/cellulitis - keflex 500 mg po TID x 10 days - warm compresses 3-4x day - RTC if area gets larger, firmer or pain increases or for fever greater than 101  2. Cough - likely viral etiology, improving daily - chlorpheniramine-HYDROcodone (TUSSIONEX PENNKINETIC ER) 10-8 MG/5ML LQCR; Take 5 mLs by mouth at bedtime as needed.  Dispense: 70 mL; Refill: 0 - albuterol inhaler q 2-4 hours PRN - RTC if no improvement in 3-4 days, sooner if worsening symptoms.  3. Other seasonal allergic rhinitis - continue flonase, add daily OTC long acting antihistamine.  Patient Instructions  Use albuterol inhaler every 4 hours as needed for cough, chest tightness, wheeze Keep using Qvar and flonase every day Take Mucinex D only in the morning, take plain Mucinex in the evening so you can sleep Can take afrin nasal spray (generic fine) twice a day for 3 days for nasal congestions- I recommend you take it in the evening Please start a daily antihistamine and take through the spring allergy season. Zyrtec is the strongest (generic  is fine) or you can use Claritin or Allegra (generic fine) Please log onto mychart and send me your itinerary for your cruise and what immunizations/medications you think you will need. Schedule a visit in the next 2-3 months and start walking to get in shape!   Emi Belfasteborah B. Gessner, FNP-BC  [facility]  03/09/2015 10:07 PM

## 2015-03-08 NOTE — Patient Instructions (Signed)
Use albuterol inhaler every 4 hours as needed for cough, chest tightness, wheeze Keep using Qvar and flonase every day Take Mucinex D only in the morning, take plain Mucinex in the evening so you can sleep Can take afrin nasal spray (generic fine) twice a day for 3 days for nasal congestions- I recommend you take it in the evening Please start a daily antihistamine and take through the spring allergy season. Zyrtec is the strongest (generic is fine) or you can use Claritin or Allegra (generic fine) Please log onto mychart and send me your itinerary for your cruise and what immunizations/medications you think you will need. Schedule a visit in the next 2-3 months and start walking to get in shape!

## 2015-03-08 NOTE — Telephone Encounter (Signed)
Pharm called looking for 2 other Rxs for pt. I checked and they went to [facility] in error. I resent these to local pharm, and [facility]

## 2015-05-31 ENCOUNTER — Telehealth: Payer: Self-pay | Admitting: *Deleted

## 2015-05-31 NOTE — Telephone Encounter (Signed)
Patient requested script for Hep A vaccine and Zostavax she will be [place]f the country and need these. Script written. Patient notified she will pick it up on Wednesday Morning.

## 2015-06-09 ENCOUNTER — Telehealth: Payer: Self-pay | Admitting: Internal Medicine

## 2015-06-09 NOTE — Telephone Encounter (Signed)
Patient called to cancel her lab and office visit for next week (6/20 and 6/21).  She states "something came up".  She doesn't want to re-schedule at this time.  She'll call back.

## 2015-06-13 ENCOUNTER — Other Ambulatory Visit: Payer: 59 | Admitting: Internal Medicine

## 2015-06-14 ENCOUNTER — Ambulatory Visit: Payer: 59 | Admitting: Internal Medicine

## 2015-06-14 ENCOUNTER — Encounter: Payer: Self-pay | Admitting: Internal Medicine

## 2015-06-15 ENCOUNTER — Encounter: Payer: Self-pay | Admitting: Internal Medicine

## 2015-06-16 ENCOUNTER — Other Ambulatory Visit: Payer: Self-pay | Admitting: Family Medicine

## 2015-06-24 ENCOUNTER — Ambulatory Visit (INDEPENDENT_AMBULATORY_CARE_PROVIDER_SITE_OTHER): Payer: 59

## 2015-06-24 ENCOUNTER — Ambulatory Visit (INDEPENDENT_AMBULATORY_CARE_PROVIDER_SITE_OTHER): Payer: 59 | Admitting: Family Medicine

## 2015-06-24 VITALS — BP 112/68 | HR 72 | Temp 98.1°F | Resp 16 | Ht 64.0 in | Wt 193.4 lb

## 2015-06-24 DIAGNOSIS — M545 Low back pain, unspecified: Secondary | ICD-10-CM

## 2015-06-24 MED ORDER — PREDNISONE 20 MG PO TABS: ORAL_TABLET | ORAL | Status: DC

## 2015-06-24 MED ORDER — CYCLOBENZAPRINE HCL 10 MG PO TABS: 10.0000 mg | ORAL_TABLET | Freq: Two times a day (BID) | ORAL | Status: DC | PRN

## 2015-06-24 MED ORDER — TRAMADOL HCL 50 MG PO TABS
50.0000 mg | ORAL_TABLET | Freq: Three times a day (TID) | ORAL | Status: DC | PRN
Start: 2015-06-24 — End: 2015-08-01

## 2015-06-24 NOTE — Progress Notes (Signed)
[facility] [address], [facility] [address] [phone]  Date:  06/24/2015   Name:  Donna Spears   DOB:  1956/04/26   MRN:  [id number]  PCP:  Margaree Mackintosh, MD    Chief Complaint: Back Pain   History of Present Illness:  Donna Spears is a 59 y.o. very pleasant female patient who presents with the following:  She is here today today with back pain. Insidious onset- she has had some back trouble in the past but never quite like this.  She "over-did it" last weekend and noticed some discomfort- yesterday her back pain got worse.  She "moved the wrong way" and became acutely more severe.  No numbness or weakness.  She does admit to some pain into her left buttock and sometimes down the left leg No bowel or bladder difficulty   No history of back surgery. Never had an MRI- no x-rays in the past At home they have tried ibuprofen and heat.   She is generally in good health 800 mg of ibuprofen TID.    Patient Active Problem List   Diagnosis Date Noted  . Obesity, unspecified 10/01/2013  . Allergic rhinitis 10/01/2013  . GE reflux 08/12/2012  . Depression 08/12/2012  . Hyperlipidemia, mixed 08/12/2012    Past Medical History  Diagnosis Date  . Anemia   . Asthma   . Depression   . GERD (gastroesophageal reflux disease)   . Hyperlipidemia   . Allergy     History reviewed. No pertinent past surgical history.  History  Substance Use Topics  . Smoking status: Never Smoker   . Smokeless tobacco: Never Used  . Alcohol Use: No    Family History  Problem Relation Age of Onset  . Heart disease Mother   . Hypertension Mother   . Hyperlipidemia Father   . Heart disease Brother   . Hypertension Brother     Allergies  Allergen Reactions  . Azithromycin Nausea And Vomiting    Medication list has been reviewed and updated.  Current Outpatient Prescriptions on File Prior to Visit  Medication Sig Dispense Refill  . acyclovir ointment (ZOVIRAX) 5 %  Apply topically every 3 (three) hours. 5 g 5  . albuterol (PROVENTIL HFA;VENTOLIN HFA) 108 (90 BASE) MCG/ACT inhaler Inhale 2 puffs into the lungs every 4 (four) hours as needed for wheezing or shortness of breath. 1 Inhaler 1  . atorvastatin (LIPITOR) 40 MG tablet Take 1 tablet by mouth  daily 90 tablet 3  . beclomethasone (QVAR) 80 MCG/ACT inhaler Inhale 1 puff into the lungs 2 (two) times daily. Rinse mouth after using. 1 Inhaler 12  . esomeprazole (NEXIUM) 40 MG capsule TAKE 1 CAPSULE DAILY AS NEEDED 90 capsule 3  . FIBER PO Take by mouth.    . fluticasone (FLONASE) 50 MCG/ACT nasal spray PLACE 2 SPRAYS INTO BOTH NOSTRILS DAILY. 16 g 8  . Multiple Vitamin (MULTIVITAMIN) capsule Take 1 capsule by mouth daily.    Marland Kitchen PRISTIQ 50 MG 24 hr tablet Take 1 tablet by mouth  daily 90 tablet 3  . cephALEXin (KEFLEX) 500 MG capsule Take 1 capsule (500 mg total) by mouth 3 (three) times daily. (Patient not taking: Reported on 06/24/2015) 30 capsule 0  . chlorpheniramine-HYDROcodone (TUSSIONEX PENNKINETIC ER) 10-8 MG/5ML LQCR Take 5 mLs by mouth at bedtime as needed. (Patient not taking: Reported on 06/24/2015) 70 mL 0  . hyoscyamine (LEVSIN/SL) 0.125 MG SL tablet One sublingual one half hour before  meals (Patient not taking: Reported on 06/24/2015) 90 tablet 0  . [DISCONTINUED] mometasone-formoterol (DULERA) 200-5 MCG/ACT AERO Inhale 2 puffs into the lungs 2 (two) times daily. 8.8 g 0   No current facility-administered medications on file prior to visit.    Review of Systems:  As per HPI- otherwise negative.   Physical Examination: Filed Vitals:   06/24/15 1416  BP: 112/68  Pulse: 72  Temp: 98.1 F (36.7 C)  Resp: 16   Filed Vitals:   06/24/15 1416  Height: 5\' 4"  (1.626 m)  Weight: 193 lb 6.4 oz (87.726 kg)   Body mass index is 33.18 kg/(m^2). Ideal Body Weight: Weight in (lb) to have BMI = 25: 145.3  GEN: WDWN, NAD, Non-toxic, A & O x 3, overweight, looks well HEENT: Atraumatic,  Normocephalic. Neck supple. No masses, No LAD. Ears and Nose: No external deformity. CV: RRR, No M/G/R. No JVD. No thrill. No extra heart sounds. PULM: CTA B, no wheezes, crackles, rhonchi. No retractions. No resp. distress. No accessory muscle use. ABD: S, NT, ND She has mild tenderness in the low left lumbar paraspinous muscles and buttock No bony TTP Positive SLR on the left only Normal BLE strength, sensation and DTR EXTR: No c/c/e NEURO Normal gait.  PSYCH: Normally interactive. Conversant. Not depressed or anxious appearing.  Calm demeanor.   [facility] reading (PRIMARY) by  Dr. Patsy Lageropland. L spine: normal lumbar spine  LUMBAR SPINE - COMPLETE 4+ VIEW  COMPARISON: None  FINDINGS: The lumbar vertebral bodies are preserved in height. The pedicles and transverse processes are intact. The disc space heights are well maintained. There is no spondylolisthesis. There is no significant facet joint hypertrophy. The observed portions of the sacrum are normal.  IMPRESSION: There is no acute or significant chronic bony abnormality of the lumbar spine.   Assessment and Plan: Left-sided low back pain without sciatica - Plan: DG Lumbar Spine Complete, cyclobenzaprine (FLEXERIL) 10 MG tablet, traMADol (ULTRAM) 50 MG tablet, predniSONE (DELTASONE) 20 MG tablet  Lumbar strain with disc signs on the left Treat with prednisone, flexeril as needed, tramadol as needed She will let me know if not feeling better soon- Sooner if worse.   No NSAIDs while on prednisone Meds ordered this encounter  Medications  . cyclobenzaprine (FLEXERIL) 10 MG tablet    Sig: Take 1 tablet (10 mg total) by mouth 2 (two) times daily as needed for muscle spasms.    Dispense:  20 tablet    Refill:  0  . traMADol (ULTRAM) 50 MG tablet    Sig: Take 1 tablet (50 mg total) by mouth every 8 (eight) hours as needed.    Dispense:  20 tablet    Refill:  0  . DISCONTD: predniSONE (DELTASONE) 20 MG tablet    Sig: Take 2  pills a day for 3 days, then 2 pills a day for 3 days    Dispense:  9 tablet    Refill:  0  . predniSONE (DELTASONE) 20 MG tablet    Sig: Take 2 pills a day for 3 days, then 1pill a day for 3 days    Dispense:  9 tablet    Refill:  0     Signed Abbe AmsterdamJessica Copland, MD

## 2015-06-24 NOTE — Patient Instructions (Signed)
Use the prednisone as directed.  Use flexeril (muscle relaxer) and tramadol (pain med) as needed for pain They both will make you sleepy- especially the flexeril You might want to take the flexeril at night and the tramadol during the day Avoid NSIADs like ibuprofen while on prednisone- tylenol is ok congratutions on the upcoming birth of your grandson!  Let me know if your back is not better soon

## 2015-08-01 ENCOUNTER — Ambulatory Visit (INDEPENDENT_AMBULATORY_CARE_PROVIDER_SITE_OTHER): Payer: 59 | Admitting: Family Medicine

## 2015-08-01 ENCOUNTER — Encounter: Payer: Self-pay | Admitting: Family Medicine

## 2015-08-01 VITALS — BP 145/83 | HR 77 | Temp 98.5°F | Resp 16 | Ht 64.0 in | Wt 192.0 lb

## 2015-08-01 DIAGNOSIS — Z23 Encounter for immunization: Secondary | ICD-10-CM | POA: Diagnosis not present

## 2015-08-01 DIAGNOSIS — E669 Obesity, unspecified: Secondary | ICD-10-CM | POA: Diagnosis not present

## 2015-08-01 DIAGNOSIS — E785 Hyperlipidemia, unspecified: Secondary | ICD-10-CM | POA: Diagnosis not present

## 2015-08-01 DIAGNOSIS — A09 Infectious gastroenteritis and colitis, unspecified: Secondary | ICD-10-CM | POA: Diagnosis not present

## 2015-08-01 DIAGNOSIS — Z789 Other specified health status: Secondary | ICD-10-CM

## 2015-08-01 LAB — COMPREHENSIVE METABOLIC PANEL
ALBUMIN: 4.1 g/dL (ref 3.6–5.1)
ALK PHOS: 102 U/L (ref 33–130)
ALT: 31 U/L — ABNORMAL HIGH (ref 6–29)
AST: 23 U/L (ref 10–35)
BUN: 11 mg/dL (ref 7–25)
CALCIUM: 9.5 mg/dL (ref 8.6–10.4)
CO2: 27 mmol/L (ref 20–31)
Chloride: 105 mmol/L (ref 98–110)
Creat: 0.61 mg/dL (ref 0.50–0.99)
Glucose, Bld: 90 mg/dL (ref 65–99)
Potassium: 4.2 mmol/L (ref 3.5–5.3)
Sodium: 142 mmol/L (ref 135–146)
Total Bilirubin: 0.5 mg/dL (ref 0.2–1.2)
Total Protein: 6.6 g/dL (ref 6.1–8.1)

## 2015-08-01 LAB — CBC
HCT: 44.5 % (ref 36.0–46.0)
Hemoglobin: 15.2 g/dL — ABNORMAL HIGH (ref 12.0–15.0)
MCH: 29.3 pg (ref 26.0–34.0)
MCHC: 34.2 g/dL (ref 30.0–36.0)
MCV: 85.7 fL (ref 78.0–100.0)
MPV: 10.4 fL (ref 8.6–12.4)
PLATELETS: 233 10*3/uL (ref 150–400)
RBC: 5.19 MIL/uL — ABNORMAL HIGH (ref 3.87–5.11)
RDW: 13.5 % (ref 11.5–15.5)
WBC: 5.8 10*3/uL (ref 4.0–10.5)

## 2015-08-01 LAB — LIPID PANEL
Cholesterol: 204 mg/dL — ABNORMAL HIGH (ref 125–200)
HDL: 45 mg/dL — AB (ref >=46)
LDL Cholesterol: 117 mg/dL (ref <130)
TRIGLYCERIDES: 209 mg/dL — AB (ref <150)
Total CHOL/HDL Ratio: 4.5 Ratio (ref <=5.0)
VLDL: 42 mg/dL — ABNORMAL HIGH (ref <30)

## 2015-08-01 LAB — HEMOGLOBIN A1C
Hgb A1c MFr Bld: 6.3 % — ABNORMAL HIGH (ref <5.7)
MEAN PLASMA GLUCOSE: 134 mg/dL — AB (ref <117)

## 2015-08-01 MED ORDER — AZITHROMYCIN 500 MG PO TABS: 500.0000 mg | ORAL_TABLET | Freq: Every day | ORAL | Status: DC

## 2015-08-01 NOTE — Patient Instructions (Addendum)
For traveler's diarrhea- Take probiotic daily and can add Pepto Bismol daily If you have more than 3 bowel movements in a day, take azithromycin (1 tablet) for 1 to 3 days for bacterial infection Hep A - get second dose in 6-12 months Hep B- get another dose in 1 month, third dose 4-6 months after 1st dose  Go to [facility] website for good travel information  Hepatitis B Vaccine: What You Need to Know 1. What is hepatitis B? Hepatitis B is a serious infection that affects the liver. It is caused by the hepatitis B virus.   In 2009, about 38,000 people became infected with hepatitis B.  Each year about 2,000 to 4,000 people die in the [place] from cirrhosis or liver cancer caused by hepatitis B. Hepatitis B can cause:  Acute (short-term) illness. This can lead to:  loss of appetite  tiredness  pain in muscles, joints, and stomach  diarrhea and vomiting  jaundice (yellow skin or eyes) Acute illness, with symptoms, is more common among adults. Children who become infected usually do not have symptoms.  Chronic (long-term) infection. Some people go on to develop chronic hepatitis B infection. Most of them do not have symptoms, but the infection is still very serious, and can lead to:  liver damage (cirrhosis)  liver cancer  death Chronic infection is more common among infants and children than among adults. People who are chronically infected can spread hepatitis B virus to others, even if they don't look or feel sick. Up to 1.4 million people in the [place] may have chronic hepatitis B infection.  Hepatitis B virus is easily spread through contact with the blood or other body fluids of an infected person. People can also be infected from contact with a contaminated object, where the virus can live for up to 7 days.  A baby whose mother is infected can be infected at birth;  Children, adolescents, and adults can become infected by:  contact with blood and body fluids  through breaks in the skin such as bites, cuts, or sores;  contact with objects that have blood or body fluids on them such as toothbrushes, razors, or monitoring and treatment devices for diabetes;  having unprotected sex with an infected person;  sharing needles when injecting drugs;  being stuck with a used needle. 2. Hepatitis B vaccine: Why get vaccinated? Hepatitis B vaccine can prevent hepatitis B, and the serious consequences of hepatitis B infection, including liver cancer and cirrhosis. Hepatitis B vaccine may be given by itself or in the same shot with other vaccines. Routine hepatitis B vaccination was recommended for some [place] adults and children beginning in 1982, and for all children in 1991. Since 1990, new hepatitis B infections among children and adolescents have dropped by more than 95%--and by 75% in other age groups. Vaccination gives long-term protection from hepatitis B infection, possibly lifelong. 3. Who should get hepatitis B vaccine and when? Children and adolescents  Babies normally get 3 doses of hepatitis B vaccine:  1st Dose: Birth  2nd Dose: 68-77 months of age  3rd Dose: 52-2 months of age Some babies might get 4 doses, for example, if a combination vaccine containing hepatitis B is used. (This is a single shot containing several vaccines.) The extra dose is not harmful.  Anyone through 60 years of age who didn't get the vaccine when they were younger should also be vaccinated. Adults  All unvaccinated adults at risk for hepatitis B infection should be  vaccinated. This includes:  sex partners of people infected with hepatitis B,  men who have sex with men,  people who inject street drugs,  people with more than one sex partner,  people with chronic liver or kidney disease,  people under 64 years of age with diabetes,  people with jobs that expose them to human blood or other body fluids,  household contacts of people infected with hepatitis  B,  residents and staff in institutions for the developmentally disabled,  kidney dialysis patients,  people who travel to countries where hepatitis B is common,  people with HIV infection.  Other people may be encouraged by their doctor to get hepatitis B vaccine; for example, adults 55 and older with diabetes. Anyone else who wants to be protected from hepatitis B infection may get the vaccine.  Pregnant women who are at risk for one of the reasons stated above should be vaccinated. Other pregnant women who want protection may be vaccinated. Adults getting hepatitis B vaccine should get 3 doses--with the second dose given 4 weeks after the first and the third dose 5 months after the second. Your doctor can tell you about other dosing schedules that might be used in certain circumstances. 4. Who should not get hepatitis B vaccine?  Anyone with a life-threatening allergy to yeast, or to any other component of the vaccine, should not get hepatitis B vaccine. Tell your doctor if you have any severe allergies.  Anyone who has had a life-threatening allergic reaction to a previous dose of hepatitis B vaccine should not get another dose.  Anyone who is moderately or severely ill when a dose of vaccine is scheduled should probably wait until they recover before getting the vaccine. Your doctor can give you more information about these precautions. Note: You might be asked to wait 28 days before donating blood after getting hepatitis B vaccine. This is because the screening test could mistake vaccine in the bloodstream (which is not infectious) for hepatitis B infection. 5. What are the risks from hepatitis B vaccine? Hepatitis B is a very safe vaccine. Most people do not have any problems with it. The vaccine contains non-infectious material, and cannot cause hepatitis B infection. Some mild problems have been reported:  Soreness where the shot was given (up to about 1 person in  4).  Temperature of 99.61F or higher (up to about 1 person in 15). Severe problems are extremely rare. Severe allergic reactions are believed to occur about once in 1.1 million doses. A vaccine, like any medicine, could cause a serious reaction. But the risk of a vaccine causing serious harm, or death, is extremely small. More than 100 million people in the [place] have been vaccinated with hepatitis B vaccine. 6. What if there is a serious reaction? What should I look for?  Look for anything that concerns you, such as signs of a severe allergic reaction, very high fever, or behavior changes. Signs of a severe allergic reaction can include hives, swelling of the face and throat, difficulty breathing, a fast heartbeat, dizziness, and weakness. These would start a few minutes to a few hours after the vaccination. What should I do?  If you think it is a severe allergic reaction or other emergency that can't wait, call 9-1-1 or get the person to the nearest hospital. Otherwise, call your doctor.  Afterward, the reaction should be reported to the Vaccine Adverse Event Reporting System (VAERS). Your doctor might file this report, or you can do  it yourself through the VAERS web site at www.vaers.LAgents.no, or by calling 1-(936)606-8824. VAERS is only for reporting reactions. They do not give medical advice. 7. The National Vaccine Injury Compensation Program The [employer] Vaccine Injury Compensation Program (VICP) is a federal program that was created to compensate people who may have been injured by certain vaccines. Persons who believe they may have been injured by a vaccine can learn about the program and about filing a claim by calling 1-(669) 095-0268 or visiting the VICP website at SpiritualWord.at. 8. How can I learn more?  Ask your doctor.  Call your local or state health department.  Contact the Centers for Disease Control and Prevention (CDC):  Call 651-232-9555  (1-800-CDC-INFO) or  Visit CDC's website at PicCapture.uy CDC Hepatitis B Interim VIS (01/25/11) Document Released: 10/04/2006 Document Revised: 04/26/2014 Document Reviewed: 01/21/2014 [facility] Patient Information 2015 [place], [place]. This information is not intended to replace advice given to you by your health care provider. Make sure you discuss any questions you have with your health care provider.  Hepatitis A Vaccine, Inactivated suspension for injection What is this medicine? HEPATITIS A VACCINE (hep uh TAHY tis A VAK seen) is a vaccine to protect from an infection with the hepatitis A virus. This vaccine does not contain the live virus. It will not cause a hepatitis infection. This vaccine is also used with immunoglobulin to prevent infection in people who have been exposed to hepatitis A. This medicine may be used for other purposes; ask your health care provider or pharmacist if you have questions. COMMON BRAND NAME(S): Havrix, Vaqta What should I tell my health care provider before I take this medicine? They need to know if you have any of these conditions: -bleeding disorder -fever or infection -heart disease -immune system problems -an unusual or allergic reaction to hepatitis A vaccine, latex, neomycin, other medicines, foods, dyes, or preservatives -pregnant or trying to get pregnant -breast-feeding How should I use this medicine? This vaccine is for injection into a muscle. It is given by a health care professional. A copy of Vaccine Information Statements will be given before each vaccination. Read this sheet carefully each time. The sheet may change frequently. Talk to your pediatrician regarding the use of this medicine in children. While this drug may be prescribed for children as young as 7 months of age for selected conditions, precautions do apply. Overdosage: If you think you have taken too much of this medicine contact a poison control center or emergency  room at once. NOTE: This medicine is only for you. Do not share this medicine with others. What if I miss a dose? This does not apply. What may interact with this medicine? -medicines to treat cancer -medicines that suppress your immune function like adalimumab, anakinra, etanercept, infliximab -steroid medicines like prednisone or cortisone This list may not describe all possible interactions. Give your health care provider a list of all the medicines, herbs, non-prescription drugs, or dietary supplements you use. Also tell them if you smoke, drink alcohol, or use illegal drugs. Some items may interact with your medicine. What should I watch for while using this medicine? See your health care provider for a booster shot of this vaccine as directed. Tell your doctor right away if you have any serious or unusual side effects after getting this vaccine. You will not have protection from the hepatitis A virus for at least 8 to 10 days after your first injection. The length of time you will have protection from hepatitis A  virus infection is not known. Check with your doctor if you have questions about your immunity. See your doctor before you travel out of the country. What side effects may I notice from receiving this medicine? Side effects that you should report to your doctor or health care professional as soon as possible: -allergic reactions like skin rash, itching or hives, swelling of the face, lips, or tongue -breathing problems -seizures -yellowing of the eyes or skin Side effects that usually do not require medical attention (report to your doctor or health care professional if they continue or are bothersome): -diarrhea -fever -loss of appetite -muscle pain -nausea -pain, redness, swelling or irritation at site where injected -tiredness This list may not describe all possible side effects. Call your doctor for medical advice about side effects. You may report side effects to FDA at  1-800-FDA-1088. Where should I keep my medicine? This drug is given in a hospital or clinic and will not be stored at home. NOTE: This sheet is a summary. It may not cover all possible information. If you have questions about this medicine, talk to your doctor, pharmacist, or health care provider.  2015, Elsevier/Gold Standard. (2014-04-12 13:19:40)

## 2015-08-01 NOTE — Progress Notes (Signed)
   Subjective:    Patient ID: Donna Spears, female    DOB: Feb 22, 1956, 59 y.o.   MRN: [id number]  HPI Patient presents today for advice regarding travel health. She will be on a cruise for a month. She will be going to [place], [place], [place], [place], [place], [place], [place], [place], [place] and the [place]  NCIR database shows patient has never had Hep A or B vaccines and she would like those today. There is a question as to whether or not she has had MMR vaccine. Will check titers today.   Doing well on current meds without side effects. Will check labs today.    Has not been exercising as much as she would like. Has taken in her daughter's 68 month old son.   Review of Systems No chest pain, no SOB, no cough, no wheeze, no edema.     Objective:   Physical Exam Physical Exam  Vitals reviewed. Constitutional: Oriented to person, place, and time. Appears well-developed and well-nourished.  HENT:  Head: Normocephalic and atraumatic.  Eyes: Conjunctivae are normal.  Neck: Normal range of motion. Neck supple.  Cardiovascular: Normal rate.   Pulmonary/Chest: Effort normal.  Musculoskeletal: Normal range of motion.  Neurological: Alert and oriented to person, place, and time.  Skin: Skin is warm and dry.  Psychiatric: Normal mood and affect. Behavior is normal. Judgment and thought content normal.      BP 145/83 mmHg  Pulse 77  Temp(Src) 98.5 F (36.9 C)  Resp 16  Ht _0  (1.626 m)  Wt 192 lb (87.091 kg)  BMI 32.94 kg/m2 Wt Readings from Last 3 Encounters:  08/01/15 192 lb (87.091 kg)  06/24/15 193 lb 6.4 oz (87.726 kg)  03/08/15 190 lb (86.183 kg)       Assessment & Plan:  1. Hyperlipidemia - CBC - Comprehensive metabolic panel - Lipid panel - Hemoglobin A1c  2. Obesity - CBC - Hemoglobin A1c  3. Measles, mumps, rubella (MMR) vaccination status unknown - Measles/Mumps/Rubella Immunity  4. Traveler's diarrhea - discussed using antibiotic  if 3 or more episodes of diarrhea in 24 hours - azithromycin (ZITHROMAX) 500 MG tablet; Take 1 tablet (500 mg total) by mouth daily. Take one tablet daily for 1-3 days as needed for traveler's diarrhea.  Dispense: 9 tablet; Refill: 0 (patient has had some stomach upset with azithromycin in the past, takes immodium and is able to tolerate)  5. Need for prophylactic vaccination and inoculation against viral hepatitis - Hepatitis A vaccine adult IM - Hepatitis B vaccine adult IM - Hepatitis A vaccine adult IM; Future - Hepatitis B vaccine adult IM; Future - Hepatitis B vaccine adult IM; Future  - she needs refills on Qvar and Flonase, would like these sent to her mail order pharmacy. She will call me and let me know the name of the pharmacy so they can be sent electronically.   Clarene Reamer, FNP-BC  [facility]  08/01/2015 12:54 PM

## 2015-08-02 LAB — MEASLES/MUMPS/RUBELLA IMMUNITY
Mumps IgG: >300 AU/mL — ABNORMAL HIGH (ref <9.00)
RUBELLA: 6.4 {index} — AB (ref <0.90)
Rubeola IgG: >300 AU/mL — ABNORMAL HIGH (ref <25.00)

## 2015-08-03 ENCOUNTER — Telehealth: Payer: Self-pay

## 2015-08-03 NOTE — Telephone Encounter (Signed)
Pt wanted Olean Ree to know her pharmacy is [facility]. You may call her at [phone] if needed

## 2015-08-04 ENCOUNTER — Other Ambulatory Visit: Payer: Self-pay | Admitting: Family Medicine

## 2015-08-04 DIAGNOSIS — J45901 Unspecified asthma with (acute) exacerbation: Secondary | ICD-10-CM

## 2015-08-04 DIAGNOSIS — J309 Allergic rhinitis, unspecified: Secondary | ICD-10-CM

## 2015-08-04 MED ORDER — BECLOMETHASONE DIPROPIONATE 80 MCG/ACT IN AERS: 1.0000 | INHALATION_SPRAY | Freq: Two times a day (BID) | RESPIRATORY_TRACT | Status: DC

## 2015-08-04 MED ORDER — FLUTICASONE PROPIONATE 50 MCG/ACT NA SUSP: 2.0000 | Freq: Every day | NASAL | Status: DC

## 2015-08-04 NOTE — Telephone Encounter (Signed)
Pharmacy changed in Epic. Tona Sensing.

## 2015-09-22 ENCOUNTER — Other Ambulatory Visit: Payer: Self-pay | Admitting: Internal Medicine

## 2015-09-22 NOTE — Telephone Encounter (Signed)
Due for CPE after Oct 29. No appt. Please call

## 2015-09-30 ENCOUNTER — Ambulatory Visit (INDEPENDENT_AMBULATORY_CARE_PROVIDER_SITE_OTHER): Payer: 59

## 2015-09-30 DIAGNOSIS — Z23 Encounter for immunization: Secondary | ICD-10-CM | POA: Diagnosis not present

## 2015-10-10 ENCOUNTER — Telehealth: Payer: Self-pay

## 2015-10-10 NOTE — Telephone Encounter (Signed)
Patient is requesting tussenix for cough - she is going out of town and knows she will get a sinus infection.  Tussenix is the only thing that works for her.   [facility] [address]   [phone]

## 2015-10-10 NOTE — Telephone Encounter (Signed)
I called patient. She just wants some cough syrup for her trip. I will be back in the office on Wed. And will call her when I have her prescription ready.

## 2015-10-12 ENCOUNTER — Other Ambulatory Visit: Payer: Self-pay | Admitting: Family Medicine

## 2015-10-12 DIAGNOSIS — R05 Cough: Secondary | ICD-10-CM

## 2015-10-12 DIAGNOSIS — R059 Cough, unspecified: Secondary | ICD-10-CM

## 2015-10-12 MED ORDER — HYDROCOD POLST-CPM POLST ER 10-8 MG/5ML PO SUER: 5.0000 mL | Freq: Two times a day (BID) | ORAL | Status: DC | PRN

## 2015-10-12 NOTE — Telephone Encounter (Signed)
Called patient's home and cell numbers. Cell mailbox full. Attempted to leave message on home phone that her prescription is ready for pick up at 104. Not sure if I successfully left a message as her home machine was making a funny noise.

## 2015-12-12 ENCOUNTER — Telehealth: Payer: Self-pay

## 2015-12-12 MED ORDER — PANTOPRAZOLE SODIUM 40 MG PO TBEC: 40.0000 mg | DELAYED_RELEASE_TABLET | Freq: Every day | ORAL | Status: DC

## 2015-12-12 NOTE — Telephone Encounter (Signed)
Patient scheduled appt for Jan 19th

## 2015-12-12 NOTE — Telephone Encounter (Signed)
Patient notified and will call us back

## 2015-12-12 NOTE — Telephone Encounter (Signed)
Patient states that she cannot afford $60 every 3 months for nexium but wants to change something else more affordable. Call at [phone] or [phone].

## 2015-12-12 NOTE — Telephone Encounter (Signed)
She needs to call her insurance company and see what the options are and let us know what to prescribe.

## 2015-12-21 ENCOUNTER — Other Ambulatory Visit: Payer: Self-pay | Admitting: Internal Medicine

## 2016-01-06 ENCOUNTER — Telehealth: Payer: Self-pay

## 2016-01-06 NOTE — Telephone Encounter (Signed)
Patient states that she is coming Tuesday for lipids and liver labs and she wants to also have her A1C and platelets checked. Is this ok?

## 2016-01-06 NOTE — Telephone Encounter (Signed)
Will need to have CBC diff for platelet count and AIC to screen for DM

## 2016-01-08 ENCOUNTER — Other Ambulatory Visit: Payer: Self-pay | Admitting: Internal Medicine

## 2016-01-10 ENCOUNTER — Other Ambulatory Visit: Payer: 59 | Admitting: Internal Medicine

## 2016-01-10 DIAGNOSIS — R7301 Impaired fasting glucose: Secondary | ICD-10-CM

## 2016-01-10 DIAGNOSIS — Z79899 Other long term (current) drug therapy: Secondary | ICD-10-CM

## 2016-01-10 DIAGNOSIS — E785 Hyperlipidemia, unspecified: Secondary | ICD-10-CM

## 2016-01-10 DIAGNOSIS — D649 Anemia, unspecified: Secondary | ICD-10-CM

## 2016-01-10 LAB — CBC WITH DIFFERENTIAL/PLATELET
BASOS ABS: 0 10*3/uL (ref 0.0–0.1)
Basophils Relative: 0 % (ref 0–1)
EOS ABS: 0 10*3/uL (ref 0.0–0.7)
Eosinophils Relative: 1 % (ref 0–5)
HEMATOCRIT: 41.2 % (ref 36.0–46.0)
HEMOGLOBIN: 13.6 g/dL (ref 12.0–15.0)
LYMPHS ABS: 1.4 10*3/uL (ref 0.7–4.0)
LYMPHS PCT: 29 % (ref 12–46)
MCH: 28.9 pg (ref 26.0–34.0)
MCHC: 33 g/dL (ref 30.0–36.0)
MCV: 87.7 fL (ref 78.0–100.0)
MONOS PCT: 13 % — AB (ref 3–12)
MPV: 10.9 fL (ref 8.6–12.4)
Monocytes Absolute: 0.6 10*3/uL (ref 0.1–1.0)
NEUTROS ABS: 2.7 10*3/uL (ref 1.7–7.7)
NEUTROS PCT: 57 % (ref 43–77)
PLATELETS: 245 10*3/uL (ref 150–400)
RBC: 4.7 MIL/uL (ref 3.87–5.11)
RDW: 13.3 % (ref 11.5–15.5)
WBC: 4.8 10*3/uL (ref 4.0–10.5)

## 2016-01-10 LAB — HEPATIC FUNCTION PANEL
ALT: 21 U/L (ref 6–29)
AST: 17 U/L (ref 10–35)
Albumin: 3.8 g/dL (ref 3.6–5.1)
Alkaline Phosphatase: 74 U/L (ref 33–130)
BILIRUBIN INDIRECT: 0.4 mg/dL (ref 0.2–1.2)
BILIRUBIN TOTAL: 0.5 mg/dL (ref 0.2–1.2)
Bilirubin, Direct: 0.1 mg/dL (ref <=0.2)
Total Protein: 6 g/dL — ABNORMAL LOW (ref 6.1–8.1)

## 2016-01-10 LAB — LIPID PANEL
CHOL/HDL RATIO: 4 ratio (ref <=5.0)
CHOLESTEROL: 175 mg/dL (ref 125–200)
HDL: 44 mg/dL — ABNORMAL LOW (ref >=46)
LDL CALC: 108 mg/dL (ref <130)
Triglycerides: 116 mg/dL (ref <150)
VLDL: 23 mg/dL (ref <30)

## 2016-01-11 LAB — HEMOGLOBIN A1C
Hgb A1c MFr Bld: 5.9 % — ABNORMAL HIGH (ref <5.7)
Mean Plasma Glucose: 123 mg/dL — ABNORMAL HIGH (ref <117)

## 2016-01-12 ENCOUNTER — Encounter: Payer: Self-pay | Admitting: Internal Medicine

## 2016-01-12 ENCOUNTER — Ambulatory Visit (INDEPENDENT_AMBULATORY_CARE_PROVIDER_SITE_OTHER): Payer: 59 | Admitting: Internal Medicine

## 2016-01-12 VITALS — BP 130/76 | HR 88 | Temp 98.4°F | Resp 20 | Ht 64.0 in | Wt 178.5 lb

## 2016-01-12 DIAGNOSIS — E785 Hyperlipidemia, unspecified: Secondary | ICD-10-CM

## 2016-01-12 DIAGNOSIS — K589 Irritable bowel syndrome without diarrhea: Secondary | ICD-10-CM | POA: Diagnosis not present

## 2016-01-12 DIAGNOSIS — R7302 Impaired glucose tolerance (oral): Secondary | ICD-10-CM

## 2016-01-12 DIAGNOSIS — K219 Gastro-esophageal reflux disease without esophagitis: Secondary | ICD-10-CM

## 2016-01-12 DIAGNOSIS — Z8659 Personal history of other mental and behavioral disorders: Secondary | ICD-10-CM

## 2016-01-12 NOTE — Progress Notes (Signed)
   Subjective:    Patient ID: Donna Spears, female    DOB: [birth year], 60 y.o.   MRN: [id number]  HPI Patient in today for six-month recheck on hyperlipidemia. Lipid panel and liver functions are normal. She feels well. However hemoglobin A1c has improved over the past few months from 6.3% to  5.9%. Says she's trying to diet and exercise. She has a history of irritable bowel syndrome and Dr. Ewing Schlein gave her medication which she found helpful on her cruise to [place]. Has determined the certain foods seem to trigger symptoms. She spending 4 days a week with 2 grandchildren, ages 40 months and age 82 years, keeping them for her daughter. Seems to enjoy this. Planning on another trip to [place]. she had colonoscopy to screen  for possible colitis by Dr. Ewing Schlein in September. Study was normal with the exception of one benign polyp.History of depression treated with Pristiq.     Review of Systems noncontributory     Objective:   Physical Exam Skin warm and dry. Nodes none. Neck is supple without JVD thyromegaly or carotid bruits. Chest clear to auscultation. Cardiac exam regular rate and rhythm normal S1 and S2. Extremities without edema. Alert and oriented 3.       Assessment & Plan:  Hyperlipidemia-stable on statin therapy  GE reflux-stable  Irritable bowel syndrome-improved with Bentyl  Impaired glucose tolerance-improved with diet and exercise  History of depression   Plan: Continue same medications and return in 6 months for physical examination.

## 2016-01-12 NOTE — Patient Instructions (Signed)
Continue same medications and return in 6 months for physical exam. It was a pleasure to see you today. 

## 2016-01-20 ENCOUNTER — Telehealth: Payer: Self-pay | Admitting: Internal Medicine

## 2016-01-20 NOTE — Telephone Encounter (Signed)
Ms. Ham called asking if the Rx for Protonix was sent to [facility]. She'd like a phone call regarding this.  Pt's ph# [phone]  Thank you.

## 2016-01-23 MED ORDER — PANTOPRAZOLE SODIUM 40 MG PO TBEC: 40.0000 mg | DELAYED_RELEASE_TABLET | Freq: Every day | ORAL | Status: DC

## 2016-01-23 NOTE — Telephone Encounter (Signed)
Patient states that [facility] to [facility] her [facility] until her mail order came. The mail order was never sent to [facility]. I have sent the #90 supply to pharmacy as requested.

## 2016-03-01 ENCOUNTER — Other Ambulatory Visit: Payer: Self-pay | Admitting: Internal Medicine

## 2016-04-09 ENCOUNTER — Encounter: Payer: Self-pay | Admitting: Internal Medicine

## 2016-04-09 ENCOUNTER — Ambulatory Visit (INDEPENDENT_AMBULATORY_CARE_PROVIDER_SITE_OTHER): Payer: 59 | Admitting: Internal Medicine

## 2016-04-09 VITALS — BP 126/80 | HR 76 | Temp 98.0°F | Resp 20 | Ht 64.0 in | Wt 178.0 lb

## 2016-04-09 DIAGNOSIS — M545 Low back pain: Secondary | ICD-10-CM

## 2016-04-09 LAB — POCT URINALYSIS DIPSTICK
BILIRUBIN UA: NEGATIVE
Glucose, UA: NEGATIVE
KETONES UA: NEGATIVE
LEUKOCYTES UA: NEGATIVE
Nitrite, UA: NEGATIVE
PH UA: 8
Protein, UA: NEGATIVE
RBC UA: NEGATIVE
Spec Grav, UA: 1.01
Urobilinogen, UA: 0.2

## 2016-04-09 MED ORDER — CYCLOBENZAPRINE HCL 10 MG PO TABS: 10.0000 mg | ORAL_TABLET | Freq: Three times a day (TID) | ORAL | Status: DC | PRN

## 2016-04-09 NOTE — Progress Notes (Signed)
   Subjective:    Patient ID: Donna Spears, female    DOB: 07-14-1956, 60 y.o.   MRN: [id number]  HPI 60 year old Female in today with low back pain. Seems to have been aggravated with yard work. Pain is low back without radiation into legs. Difficult to move around without pain.    Review of Systems as above     Objective:   Physical Exam  Straight leg raising is negative at 90 bilaterally muscle strength is normal in the lower extremities. Range of motion in the trunk is decreased due to pain      Assessment & Plan:  Low back strain  Plan: Flexeril 10 mg one half tablet by mouth daily at bedtime. Sterapred DS 10 mg six-day DosepakTo take in tapering course as directed. Thinks she has left over prescription. Tramadol as needed for pain. Call if not better next week.

## 2016-04-09 NOTE — Patient Instructions (Addendum)
Take Flexeril 10 mg tid as needed. Take Tramadol for pain.  Sterapred DS 10 mg dosepack in tapering course over 6 days.

## 2016-04-13 ENCOUNTER — Other Ambulatory Visit: Payer: Self-pay

## 2016-04-13 MED ORDER — CYCLOBENZAPRINE HCL 10 MG PO TABS: 10.0000 mg | ORAL_TABLET | Freq: Three times a day (TID) | ORAL | Status: DC | PRN

## 2016-05-05 ENCOUNTER — Ambulatory Visit (INDEPENDENT_AMBULATORY_CARE_PROVIDER_SITE_OTHER): Payer: 59 | Admitting: Internal Medicine

## 2016-05-05 VITALS — BP 129/82 | HR 69 | Temp 97.9°F | Resp 16 | Ht 64.0 in | Wt 180.2 lb

## 2016-05-05 DIAGNOSIS — J301 Allergic rhinitis due to pollen: Secondary | ICD-10-CM

## 2016-05-05 MED ORDER — PREDNISONE 20 MG PO TABS: ORAL_TABLET | ORAL | Status: DC

## 2016-05-05 MED ORDER — AMOXICILLIN 875 MG PO TABS: 875.0000 mg | ORAL_TABLET | Freq: Two times a day (BID) | ORAL | Status: DC

## 2016-05-05 MED ORDER — HYDROCODONE-HOMATROPINE 5-1.5 MG/5ML PO SYRP: 5.0000 mL | ORAL_SOLUTION | Freq: Four times a day (QID) | ORAL | Status: DC | PRN

## 2016-05-05 NOTE — Progress Notes (Signed)
By signing my name below I, Donna Spears, attest that this documentation has been prepared under the direction and in the presence of Donna Siaobert Ian Castagna, MD. Electonically Signed. Donna Spears, Scribe 05/05/2016 at 3:17 PM  Subjective:    Patient ID: Donna Spears, female    DOB: 27-Jul-1956, 60 y.o.   MRN: [id number]  Chief Complaint  Patient presents with  . sinus congestion    x wed.    HPI Donna MuscatDonna Sawaya is a 60 y.o. female who presents to the [facility] complaining of cough and sinus congestion for the past 4 days.  Cough keeps pt up at night.   Pt denies history of DM.    Patient Active Problem List   Diagnosis Date Noted  . Obesity, unspecified 10/01/2013  . Allergic rhinitis 10/01/2013  . GE reflux 08/12/2012  . Depression 08/12/2012  . Hyperlipidemia, mixed 08/12/2012    Current outpatient prescriptions:  .  atorvastatin (LIPITOR) 40 MG tablet, Take 1 tablet by mouth  daily, Disp: 90 tablet, Rfl: 1 .  beclomethasone (QVAR) 80 MCG/ACT inhaler, Inhale 1 puff into the lungs 2 (two) times daily. Rinse mouth after using., Disp: 3 Inhaler, Rfl: 3 .  cyclobenzaprine (FLEXERIL) 10 MG tablet, Take 1 tablet (10 mg total) by mouth 3 (three) times daily as needed for muscle spasms., Disp: 30 tablet, Rfl: 5 .  fluticasone (FLONASE) 50 MCG/ACT nasal spray, Place 2 sprays into both nostrils daily., Disp: 48 g, Rfl: 3 .  Multiple Vitamins-Minerals (ALIVE WOMENS 50+ PO), Take by mouth., Disp: , Rfl:  .  NEXIUM 40 MG capsule, Take 1 capsule by mouth  daily as needed, Disp: 90 capsule, Rfl: 1 .  pantoprazole (PROTONIX) 40 MG tablet, Take 1 tablet (40 mg total) by mouth daily., Disp: 90 tablet, Rfl: 1 .  PRISTIQ 50 MG 24 hr tablet, Take 1 tablet by mouth  daily (Need office visit  prior to anymore refills), Disp: 90 tablet, Rfl: 1 .  cetirizine (KLS ALLER-TEC) 10 MG tablet, Take 10 mg by mouth daily. Reported on 05/05/2016, Disp: , Rfl:  .  [DISCONTINUED]  mometasone-formoterol (DULERA) 200-5 MCG/ACT AERO, Inhale 2 puffs into the lungs 2 (two) times daily., Disp: 8.8 g, Rfl: 0  Allergies  Allergen Reactions  . Azithromycin Nausea And Vomiting      Review of Systems  HENT: Positive for congestion.   Respiratory: Positive for cough.   Psychiatric/Behavioral: Positive for sleep disturbance.       Objective:   Physical Exam  Constitutional: She is oriented to person, place, and time. She appears well-developed and well-nourished. No distress.  HENT:  Head: Normocephalic and atraumatic.  Right Ear: Tympanic membrane normal.  Left Ear: Tympanic membrane normal.  Nares have purulent discharge.  Eyes: Conjunctivae are normal. Pupils are equal, round, and reactive to light.  Neck: Neck supple.  Cardiovascular: Normal rate, regular rhythm and normal heart sounds.  Exam reveals no gallop.   No murmur heard. Pulmonary/Chest: Effort normal and breath sounds normal. She has no decreased breath sounds. She has no wheezes. She has no rhonchi. She has no rales.  Musculoskeletal: Normal range of motion.  Lymphadenopathy:    She has no cervical adenopathy.  Neurological: She is alert and oriented to person, place, and time.  Skin: Skin is warm and dry.  Psychiatric: She has a normal mood and affect. Her behavior is normal.  Nursing note and vitals reviewed.    Filed Vitals:   05/05/16 1445  BP:  129/82  Pulse: 69  Temp: 97.9 F (36.6 C)  TempSrc: Oral  Resp: 16  Height:  (1.626 m)  Weight: 180 lb 3.2 oz (81.738 kg)  SpO2: 96%         Assessment & Plan:  Sinus congestion with cough due to allergic rhinitis-  Sudafed 12hour bid 5-7d Meds ordered this encounter  Medications  . amoxicillin (AMOXIL) 875 MG tablet---hold use til Tuesday and start if not well    Sig: Take 1 tablet (875 mg total) by mouth 2 (two) times daily.    Dispense:  20 tablet    Refill:  0  . HYDROcodone-homatropine (HYCODAN) 5-1.5 MG/5ML syrup    Sig:  Take 5 mLs by mouth every 6 (six) hours as needed.    Dispense:  120 mL    Refill:  0  . predniSONE (DELTASONE) 20 MG tablet    Sig: 3/3/2/2/1/1 single daily dose for 6 days    Dispense:  12 tablet    Refill:  0  OTC antihist  I have completed the patient encounter in its entirety as documented by the scribe, with editing by me where necessary. Donna Spears P. Donna Spears, M.D.

## 2016-05-05 NOTE — Patient Instructions (Addendum)
Ask pharmacy for 12 hour sudafed to use twice a day for 1 week    IF you received an x-ray today, you will receive an invoice from [facility]. Please contact [facility] at [phone] with questions or concerns regarding your invoice.   IF you received labwork today, you will receive an invoice from [facility]. Please contact [facility] at [phone] with questions or concerns regarding your invoice.   Our billing staff will not be able to assist you with questions regarding bills from these companies.  You will be contacted with the lab results as soon as they are available. The fastest way to get your results is to activate your My Chart account. Instructions are located on the last page of this paperwork. If you have not heard from us regarding the results in 2 weeks, please contact this office.

## 2016-05-08 ENCOUNTER — Other Ambulatory Visit: Payer: Self-pay | Admitting: Family Medicine

## 2016-05-21 ENCOUNTER — Other Ambulatory Visit: Payer: Self-pay | Admitting: Internal Medicine

## 2016-05-22 ENCOUNTER — Other Ambulatory Visit: Payer: Self-pay

## 2016-05-22 MED ORDER — PANTOPRAZOLE SODIUM 40 MG PO TBEC: 40.0000 mg | DELAYED_RELEASE_TABLET | Freq: Every day | ORAL | Status: DC

## 2016-07-01 ENCOUNTER — Other Ambulatory Visit: Payer: Self-pay | Admitting: Internal Medicine

## 2016-07-02 ENCOUNTER — Other Ambulatory Visit: Payer: Self-pay | Admitting: Internal Medicine

## 2016-07-16 ENCOUNTER — Other Ambulatory Visit: Payer: 59 | Admitting: Internal Medicine

## 2016-07-17 ENCOUNTER — Other Ambulatory Visit: Payer: 59 | Admitting: Internal Medicine

## 2016-07-19 ENCOUNTER — Encounter: Payer: Self-pay | Admitting: Internal Medicine

## 2016-07-19 ENCOUNTER — Ambulatory Visit (INDEPENDENT_AMBULATORY_CARE_PROVIDER_SITE_OTHER): Payer: 59 | Admitting: Internal Medicine

## 2016-07-19 VITALS — BP 122/74 | HR 73 | Temp 97.0°F | Ht 64.0 in | Wt 179.0 lb

## 2016-07-19 DIAGNOSIS — J301 Allergic rhinitis due to pollen: Secondary | ICD-10-CM

## 2016-07-19 DIAGNOSIS — M545 Low back pain: Secondary | ICD-10-CM | POA: Diagnosis not present

## 2016-07-19 DIAGNOSIS — E669 Obesity, unspecified: Secondary | ICD-10-CM | POA: Diagnosis not present

## 2016-07-19 DIAGNOSIS — Z8659 Personal history of other mental and behavioral disorders: Secondary | ICD-10-CM

## 2016-07-19 DIAGNOSIS — Z0000 Encounter for general adult medical examination without abnormal findings: Secondary | ICD-10-CM

## 2016-07-19 DIAGNOSIS — E739 Lactose intolerance, unspecified: Secondary | ICD-10-CM | POA: Diagnosis not present

## 2016-07-19 DIAGNOSIS — E782 Mixed hyperlipidemia: Secondary | ICD-10-CM | POA: Diagnosis not present

## 2016-07-19 DIAGNOSIS — K219 Gastro-esophageal reflux disease without esophagitis: Secondary | ICD-10-CM | POA: Diagnosis not present

## 2016-07-19 LAB — CBC WITH DIFFERENTIAL/PLATELET
BASOS ABS: 59 {cells}/uL (ref 0–200)
Basophils Relative: 1 %
EOS ABS: 59 {cells}/uL (ref 15–500)
EOS PCT: 1 %
HCT: 46 % — ABNORMAL HIGH (ref 35.0–45.0)
HEMOGLOBIN: 15.1 g/dL (ref 11.7–15.5)
LYMPHS ABS: 1770 {cells}/uL (ref 850–3900)
Lymphocytes Relative: 30 %
MCH: 29 pg (ref 27.0–33.0)
MCHC: 32.8 g/dL (ref 32.0–36.0)
MCV: 88.3 fL (ref 80.0–100.0)
MPV: 10.4 fL (ref 7.5–12.5)
Monocytes Absolute: 590 cells/uL (ref 200–950)
Monocytes Relative: 10 %
NEUTROS ABS: 3422 {cells}/uL (ref 1500–7800)
Neutrophils Relative %: 58 %
Platelets: 243 10*3/uL (ref 140–400)
RBC: 5.21 MIL/uL — ABNORMAL HIGH (ref 3.80–5.10)
RDW: 13.7 % (ref 11.0–15.0)
WBC: 5.9 10*3/uL (ref 3.8–10.8)

## 2016-07-19 LAB — POCT URINALYSIS DIPSTICK
BILIRUBIN UA: NEGATIVE
GLUCOSE UA: NEGATIVE
Ketones, UA: NEGATIVE
Leukocytes, UA: NEGATIVE
NITRITE UA: NEGATIVE
Protein, UA: NEGATIVE
RBC UA: NEGATIVE
Spec Grav, UA: >=1.03
Urobilinogen, UA: 0.2
pH, UA: 6

## 2016-07-19 LAB — LIPID PANEL
CHOL/HDL RATIO: 3.7 ratio (ref <=5.0)
CHOLESTEROL: 197 mg/dL (ref 125–200)
HDL: 53 mg/dL (ref >=46)
LDL Cholesterol: 108 mg/dL (ref <130)
TRIGLYCERIDES: 181 mg/dL — AB (ref <150)
VLDL: 36 mg/dL — AB (ref <30)

## 2016-07-19 LAB — COMPLETE METABOLIC PANEL WITH GFR
ALT: 33 U/L — ABNORMAL HIGH (ref 6–29)
AST: 25 U/L (ref 10–35)
Albumin: 4.2 g/dL (ref 3.6–5.1)
Alkaline Phosphatase: 112 U/L (ref 33–130)
BUN: 19 mg/dL (ref 7–25)
CALCIUM: 9.3 mg/dL (ref 8.6–10.4)
CHLORIDE: 105 mmol/L (ref 98–110)
CO2: 28 mmol/L (ref 20–31)
CREATININE: 0.67 mg/dL (ref 0.50–0.99)
Glucose, Bld: 93 mg/dL (ref 65–99)
POTASSIUM: 4.6 mmol/L (ref 3.5–5.3)
Sodium: 144 mmol/L (ref 135–146)
Total Bilirubin: 0.6 mg/dL (ref 0.2–1.2)
Total Protein: 6.9 g/dL (ref 6.1–8.1)

## 2016-07-19 LAB — TSH: TSH: 1.81 mIU/L

## 2016-07-20 ENCOUNTER — Telehealth: Payer: Self-pay

## 2016-07-20 LAB — VITAMIN D 25 HYDROXY (VIT D DEFICIENCY, FRACTURES): Vit D, 25-Hydroxy: 29 ng/mL — ABNORMAL LOW (ref 30–100)

## 2016-07-20 NOTE — Telephone Encounter (Signed)
Called patient. Gave lab results and instructions. Patient verbalized understanding.   

## 2016-07-20 NOTE — Telephone Encounter (Signed)
-----   Message from Margaree Mackintosh, MD sent at 07/20/2016  9:37 AM EDT ----- Needs to take 2000 units D3 daily for Vitamin D deficiency. Triglycerides have increased to 181. Watch diet and exercise. CBC and kidney plus liver functions are normal. Thyroid function normAL.

## 2016-07-21 NOTE — Progress Notes (Signed)
   Subjective:    Patient ID: Donna Spears, female    DOB: 08/05/1956, 60 y.o.   MRN: [id number]  HPI 60 year old female in today for health maintenance exam and evaluation of medical issues. In 2015 she had some issues with diarrhea and apparently has some lactose intolerance. She has a history of hyperlipidemia, GE reflux, depression. She is on statin medication. Takes Pristiq for depression.  Dr. Huel Cote is GYN physician.   Had colonoscopy by Dr. Clarise Cruz in 2007.  Tetanus immunization 2013  No known drug allergies  Also history of allergic rhinitis and fibrocystic breast disease. Had right trigger finger release by Dr. Mina Marble.  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 Has a college degree and is married to a Runner, broadcasting/film/video. Does not smoke or consume alcohol. She is now retired and keeps grandchildren. Walks several miles weekly. 2 adult children, a son and a daughter.  Family history: Mother with history of heart disease and lung cancer as well as colon cancer. One brother with history of coronary artery disease.  Dog bite to right hand summer of 2015. Rabies immunization on the dog had expired. We checked with infectious disease consultant it was felt that rabies immunization was not necessary.   Review of Systems  Constitutional: Negative.   Respiratory: Negative.   Cardiovascular: Negative.   Allergic/Immunologic:       Has bouts of allergies that respond to prednisone  Neurological: Negative.   Psychiatric/Behavioral:       Depression is stable on Pristiq       Objective:   Physical Exam  Constitutional: She is oriented to person, place, and time. She appears well-developed and well-nourished. No distress.  HENT:  Head: Normocephalic and atraumatic.  Right Ear: External ear normal.  Left Ear: External ear normal.  Mouth/Throat: No oropharyngeal exudate.  Eyes: Conjunctivae and EOM are normal. Pupils are equal, round, and  reactive to light. Right eye exhibits no discharge.  Neck: Neck supple. No JVD present. No thyromegaly present.  Cardiovascular: Normal rate, regular rhythm, normal heart sounds and intact distal pulses.   No murmur heard. Pulmonary/Chest: Effort normal and breath sounds normal. She has no wheezes.  Abdominal: She exhibits no distension and no mass. There is no tenderness. There is no rebound and no guarding.  Genitourinary:  Genitourinary Comments: Deferred to GYN  Musculoskeletal: She exhibits no edema.  Lymphadenopathy:    She has no cervical adenopathy.  Neurological: She is alert and oriented to person, place, and time. She has normal reflexes. No cranial nerve deficit.  Skin: Skin is warm. No rash noted. She is not diaphoretic.  Psychiatric: She has a normal mood and affect. Her behavior is normal. Judgment and thought content normal.  Vitals reviewed.         Assessment & Plan:  Hyperlipidemia-triglycerides slightly elevated at 181. Continue diet and exercise efforts and continue statin therapy.  History of depression treated with Pristiq and stable  History of allergic rhinitis. Have given prescription for short course of prednisone to have on hand should allergies flareup.  GE reflux treated with PPI  Probable lactose intolerance causing diarrhea times  History of low back pain  Plan: See above and return in 6 months

## 2016-07-21 NOTE — Patient Instructions (Signed)
It was pleasure to see you today. You have been given prescription for short course of prednisone to take if allergies flareup. Otherwise continue same medications and return in 6 months.

## 2016-08-09 ENCOUNTER — Other Ambulatory Visit: Payer: Self-pay | Admitting: Family Medicine

## 2016-08-09 DIAGNOSIS — J45901 Unspecified asthma with (acute) exacerbation: Secondary | ICD-10-CM

## 2016-08-10 ENCOUNTER — Other Ambulatory Visit: Payer: Self-pay | Admitting: Internal Medicine

## 2016-08-16 ENCOUNTER — Other Ambulatory Visit: Payer: Self-pay

## 2016-08-16 DIAGNOSIS — J45901 Unspecified asthma with (acute) exacerbation: Secondary | ICD-10-CM

## 2016-08-16 MED ORDER — BECLOMETHASONE DIPROPIONATE 80 MCG/ACT IN AERS: 1.0000 | INHALATION_SPRAY | Freq: Two times a day (BID) | RESPIRATORY_TRACT | 3 refills | Status: DC

## 2016-12-06 ENCOUNTER — Other Ambulatory Visit: Payer: Self-pay | Admitting: Internal Medicine

## 2016-12-06 MED ORDER — FLUTICASONE PROPIONATE 50 MCG/ACT NA SUSP: 2.0000 | Freq: Every day | NASAL | 3 refills | Status: DC

## 2017-01-17 ENCOUNTER — Other Ambulatory Visit (INDEPENDENT_AMBULATORY_CARE_PROVIDER_SITE_OTHER): Payer: 59 | Admitting: Internal Medicine

## 2017-01-17 DIAGNOSIS — Z79899 Other long term (current) drug therapy: Secondary | ICD-10-CM

## 2017-01-17 DIAGNOSIS — E785 Hyperlipidemia, unspecified: Secondary | ICD-10-CM

## 2017-01-17 DIAGNOSIS — Z23 Encounter for immunization: Secondary | ICD-10-CM

## 2017-01-17 DIAGNOSIS — R7301 Impaired fasting glucose: Secondary | ICD-10-CM

## 2017-01-17 LAB — HEPATIC FUNCTION PANEL
ALBUMIN: 3.9 g/dL (ref 3.6–5.1)
ALT: 26 U/L (ref 6–29)
AST: 21 U/L (ref 10–35)
Alkaline Phosphatase: 87 U/L (ref 33–130)
BILIRUBIN DIRECT: 0.1 mg/dL (ref <=0.2)
BILIRUBIN TOTAL: 0.6 mg/dL (ref 0.2–1.2)
Indirect Bilirubin: 0.5 mg/dL (ref 0.2–1.2)
Total Protein: 6.6 g/dL (ref 6.1–8.1)

## 2017-01-17 LAB — LIPID PANEL
CHOL/HDL RATIO: 4 ratio (ref <5.0)
CHOLESTEROL: 180 mg/dL (ref <200)
HDL: 45 mg/dL — AB (ref >50)
LDL Cholesterol: 102 mg/dL — ABNORMAL HIGH (ref <100)
TRIGLYCERIDES: 165 mg/dL — AB (ref <150)
VLDL: 33 mg/dL — ABNORMAL HIGH (ref <30)

## 2017-01-18 LAB — HEMOGLOBIN A1C
Hgb A1c MFr Bld: 5.5 % (ref <5.7)
Mean Plasma Glucose: 111 mg/dL

## 2017-01-22 ENCOUNTER — Ambulatory Visit (INDEPENDENT_AMBULATORY_CARE_PROVIDER_SITE_OTHER): Payer: 59 | Admitting: Internal Medicine

## 2017-01-22 ENCOUNTER — Encounter: Payer: Self-pay | Admitting: Internal Medicine

## 2017-01-22 VITALS — BP 120/70 | HR 82 | Wt 186.0 lb

## 2017-01-22 DIAGNOSIS — E782 Mixed hyperlipidemia: Secondary | ICD-10-CM | POA: Diagnosis not present

## 2017-01-22 DIAGNOSIS — R7302 Impaired glucose tolerance (oral): Secondary | ICD-10-CM | POA: Diagnosis not present

## 2017-01-22 DIAGNOSIS — Z8659 Personal history of other mental and behavioral disorders: Secondary | ICD-10-CM

## 2017-01-22 DIAGNOSIS — J301 Allergic rhinitis due to pollen: Secondary | ICD-10-CM | POA: Diagnosis not present

## 2017-01-22 DIAGNOSIS — K219 Gastro-esophageal reflux disease without esophagitis: Secondary | ICD-10-CM | POA: Diagnosis not present

## 2017-01-22 NOTE — Patient Instructions (Signed)
It was a pleasure to see you today. Please continue same medications along with diet and exercise and return in 6 months for physical exam. Pharmacy card given for Pristiq

## 2017-01-22 NOTE — Progress Notes (Signed)
   Subjective:    Patient ID: Donna Spears, female    DOB: 1956/04/10, 61 y.o.   MRN: [id number]  HPI 61 year old Female in today for follow-up on hyperlipidemia and impaired glucose tolerance. Has been watching her diet. However is eating some sweets.  Interestingly, hemoglobin A1c is within normal limits at 5.5%. Hemoglobin A1c 6 months ago was 5.9% Fasting lipid panel shows total cholesterol of 180, triglycerides of 165 and previously were 181  when tested 6 months ago. LDL cholesterol is stable at 102 and previously was 108 when tested 6 months ago.  History of GE reflux treated with generic Protonix. Had colonoscopy with Dr. Ewing SchleinMagod 2007.  Dr. Huel CoteKathy Richardson is GYN physician.  History of allergic rhinitis and fibrocystic breast disease.  Had physical exam 07/19/2016.  History of anxiety depression treated with Pristiq. She was given a pharmacy card today for non-generic Pristiq. Apparently not covered by her insurance and she may need to pay out of pocket.    Review of Systems     Objective:   Physical Exam  Spent 25 minutes speaking with her about the results of this lab work and management of these medical issues. Chest clear. Cardiac exam regular rate and rhythm. Normal S1 and S2. Extremity is without edema.      Assessment & Plan:  Anxiety depression-stable with Pristiq  Impaired glucose tolerance-now with normal hemoglobin A1c  Hyperlipidemia-stable on Lipitor. In 2013 her total cholesterol was 307 with triglycerides of 273 and an LDL cholesterol of 210  GE reflux-stable on Protonix  History of allergic rhinitis-treated with Flonase and Zyrtec and when necessary Ventolin inhaler  Plan: Return in 6 months for physical examination. Continue same medications. Watch diet and exercise.

## 2017-02-11 ENCOUNTER — Telehealth: Payer: Self-pay

## 2017-02-11 NOTE — Telephone Encounter (Signed)
Left message to return call 

## 2017-02-11 NOTE — Telephone Encounter (Signed)
Pt called states she went to [facility] to check on the price for PRISTIQ with the card you gave her it will be over $300.00 and the generic with the card will be $60.00.  She would like to know if there's anything else she can take that's cheaper?  She said her insurance no longer will cover for it.

## 2017-02-11 NOTE — Telephone Encounter (Signed)
Just try 30 days to start may need to adjust dose

## 2017-02-11 NOTE — Telephone Encounter (Signed)
She can try Effexor 75 mg daily

## 2017-02-11 NOTE — Telephone Encounter (Signed)
She agreed? Ok for 90 days?

## 2017-02-12 MED ORDER — VENLAFAXINE HCL 75 MG PO TABS: 75.0000 mg | ORAL_TABLET | Freq: Every day | ORAL | 0 refills | Status: DC

## 2017-02-12 NOTE — Telephone Encounter (Signed)
Patient called in and was told that only 30 was being sent in because the dosage may need to be changed.

## 2017-02-12 NOTE — Telephone Encounter (Signed)
Sent!

## 2017-03-19 ENCOUNTER — Encounter: Payer: Self-pay | Admitting: Internal Medicine

## 2017-03-19 ENCOUNTER — Other Ambulatory Visit: Payer: Self-pay

## 2017-03-19 ENCOUNTER — Ambulatory Visit (INDEPENDENT_AMBULATORY_CARE_PROVIDER_SITE_OTHER): Payer: 59 | Admitting: Internal Medicine

## 2017-03-19 VITALS — BP 120/80 | HR 74 | Temp 98.4°F | Ht 64.0 in | Wt 182.0 lb

## 2017-03-19 DIAGNOSIS — L821 Other seborrheic keratosis: Secondary | ICD-10-CM | POA: Diagnosis not present

## 2017-03-19 DIAGNOSIS — H8110 Benign paroxysmal vertigo, unspecified ear: Secondary | ICD-10-CM

## 2017-03-19 DIAGNOSIS — M79604 Pain in right leg: Secondary | ICD-10-CM

## 2017-03-19 DIAGNOSIS — M25511 Pain in right shoulder: Secondary | ICD-10-CM

## 2017-03-19 MED ORDER — BECLOMETHASONE DIPROPIONATE 80 MCG/ACT IN AERS: 1.0000 | INHALATION_SPRAY | Freq: Two times a day (BID) | RESPIRATORY_TRACT | 3 refills | Status: DC

## 2017-03-19 MED ORDER — PANTOPRAZOLE SODIUM 40 MG PO TBEC: 40.0000 mg | DELAYED_RELEASE_TABLET | Freq: Every day | ORAL | 1 refills | Status: DC

## 2017-03-19 MED ORDER — BUDESONIDE-FORMOTEROL FUMARATE 160-4.5 MCG/ACT IN AERO: 2.0000 | INHALATION_SPRAY | Freq: Two times a day (BID) | RESPIRATORY_TRACT | 12 refills | Status: DC | PRN

## 2017-03-19 MED ORDER — PREDNISONE 10 MG PO TABS
ORAL_TABLET | ORAL | 0 refills | Status: DC
Start: 2017-03-19 — End: 2017-07-01

## 2017-03-19 MED ORDER — ATORVASTATIN CALCIUM 40 MG PO TABS: 40.0000 mg | ORAL_TABLET | Freq: Every day | ORAL | 1 refills | Status: DC

## 2017-03-19 MED ORDER — CYCLOBENZAPRINE HCL 10 MG PO TABS: 10.0000 mg | ORAL_TABLET | Freq: Three times a day (TID) | ORAL | 1 refills | Status: DC | PRN

## 2017-03-19 MED ORDER — FLUTICASONE PROPIONATE 50 MCG/ACT NA SUSP: 2.0000 | Freq: Two times a day (BID) | NASAL | 3 refills | Status: DC

## 2017-03-19 MED ORDER — VENLAFAXINE HCL 75 MG PO TABS: 75.0000 mg | ORAL_TABLET | Freq: Every day | ORAL | 1 refills | Status: DC

## 2017-03-19 NOTE — Patient Instructions (Addendum)
Take prednisone taper as directed for shoulder pain. Meclizine as needed for vertigo. Have x-ray of right shoulder. Call if not better after course of prednisone.

## 2017-03-19 NOTE — Progress Notes (Signed)
   Subjective:    Patient ID: Donna Spears, female    DOB: 1956/08/08, 61 y.o.   MRN: [id number]  HPI 61 year old Female in today with right shoulder pain. No known fall or injury. Painful to move right arm. Able to move it up over head. Prior history of shoulder issues.  Also had pain left posterior proximal calf but that has resolved. Noted pain about 2 weeks ago. Once again did not recall any injury.  Has had some dizziness recently. She did change from Pristiq to Effexor which may have call some mild dizziness. Noticed that it is positional in nature. Was particular dizzy when she bent over a sink to wash her hair and return to an upright position.  Also has keratosis right arm that is benign.  Needs medications refilled.    Review of Systems see above     Objective:   Physical Exam She is able to move her right arm up over her head and abducted without significant difficulty although it is somewhat painful.  Seborrheic keratosis right arm.  No palpable tenderness right calf. Homan's sign is negative.  Extraocular movements are full. She has slight rightward nystagmus. Fundi are benign       Assessment & Plan:  Right shoulder arthropathy-to have x-ray. Try Sterapred DS 10 mg 6 day dosepak. May need to have physical therapy if symptoms persist.  Benign positional vertigo. Try meclizine over-the-counter.  Right calf musculoskeletal pain-resolved  Seborrheic keratosis right arm  Anxiety depression  Plan: Prednisone for shoulder pain. If no improvement. Consider physical therapy. Have x-ray of right shoulder. Meclizine for vertigo. Call if vertigo not better in 7-10 days.

## 2017-03-21 ENCOUNTER — Other Ambulatory Visit: Payer: Self-pay

## 2017-03-21 MED ORDER — BUDESONIDE-FORMOTEROL FUMARATE 160-4.5 MCG/ACT IN AERO: 2.0000 | INHALATION_SPRAY | Freq: Two times a day (BID) | RESPIRATORY_TRACT | 12 refills | Status: DC | PRN

## 2017-04-23 ENCOUNTER — Other Ambulatory Visit: Payer: Self-pay

## 2017-04-23 MED ORDER — VENLAFAXINE HCL 75 MG PO TABS: 75.0000 mg | ORAL_TABLET | Freq: Every day | ORAL | 3 refills | Status: DC

## 2017-04-23 MED ORDER — CYCLOBENZAPRINE HCL 10 MG PO TABS: 10.0000 mg | ORAL_TABLET | Freq: Three times a day (TID) | ORAL | 5 refills | Status: DC | PRN

## 2017-05-31 ENCOUNTER — Telehealth: Payer: Self-pay | Admitting: Internal Medicine

## 2017-05-31 NOTE — Telephone Encounter (Signed)
Patient states she has a question regarding her medications.  The Effexor - she's taking a generic.  She has lost it somewhere in her house.  She uses mail order.  She didn't take this last night.  She has looked everywhere and cannot find this medication.  Wants to know if we can call a refill in for her for this.    [facility] [address] and he checked with her insurance company and they will allow a 30 day supply.    Called patient back to advise.

## 2017-07-01 ENCOUNTER — Encounter: Payer: Self-pay | Admitting: Physician Assistant

## 2017-07-01 ENCOUNTER — Ambulatory Visit (INDEPENDENT_AMBULATORY_CARE_PROVIDER_SITE_OTHER): Payer: 59

## 2017-07-01 ENCOUNTER — Ambulatory Visit (INDEPENDENT_AMBULATORY_CARE_PROVIDER_SITE_OTHER): Payer: 59 | Admitting: Physician Assistant

## 2017-07-01 VITALS — BP 132/82 | HR 78 | Temp 97.6°F | Resp 18 | Ht 64.17 in | Wt 178.6 lb

## 2017-07-01 DIAGNOSIS — R062 Wheezing: Secondary | ICD-10-CM

## 2017-07-01 DIAGNOSIS — R059 Cough, unspecified: Secondary | ICD-10-CM

## 2017-07-01 DIAGNOSIS — R05 Cough: Secondary | ICD-10-CM | POA: Diagnosis not present

## 2017-07-01 LAB — POCT CBC
GRANULOCYTE PERCENT: 59 % (ref 37–80)
HCT, POC: 46.5 % (ref 37.7–47.9)
Hemoglobin: 16.2 g/dL (ref 12.2–16.2)
Lymph, poc: 2.2 (ref 0.6–3.4)
MCH, POC: 29.9 pg (ref 27–31.2)
MCHC: 35 g/dL (ref 31.8–35.4)
MCV: 85.5 fL (ref 80–97)
MID (CBC): 0.5 (ref 0–0.9)
MPV: 8.1 fL (ref 0–99.8)
PLATELET COUNT, POC: 229 10*3/uL (ref 142–424)
POC Granulocyte: 3.9 (ref 2–6.9)
POC LYMPH %: 32.8 % (ref 10–50)
POC MID %: 8.2 %M (ref 0–12)
RBC: 5.43 M/uL (ref 4.04–5.48)
RDW, POC: 13.4 %
WBC: 6.6 10*3/uL (ref 4.6–10.2)

## 2017-07-01 MED ORDER — ALBUTEROL SULFATE (2.5 MG/3ML) 0.083% IN NEBU
2.5000 mg | INHALATION_SOLUTION | Freq: Once | RESPIRATORY_TRACT | Status: AC
  Administered 2017-07-01: 2.5 mg via RESPIRATORY_TRACT

## 2017-07-01 MED ORDER — PREDNISONE 20 MG PO TABS: 40.0000 mg | ORAL_TABLET | Freq: Every day | ORAL | 0 refills | Status: AC

## 2017-07-01 MED ORDER — IPRATROPIUM BROMIDE HFA 17 MCG/ACT IN AERS
2.0000 | INHALATION_SPRAY | RESPIRATORY_TRACT | 0 refills | Status: DC | PRN
Start: 2017-07-01 — End: 2019-10-09

## 2017-07-01 MED ORDER — BENZONATATE 100 MG PO CAPS: 100.0000 mg | ORAL_CAPSULE | Freq: Three times a day (TID) | ORAL | 0 refills | Status: DC | PRN

## 2017-07-01 MED ORDER — HYDROCODONE-HOMATROPINE 5-1.5 MG/5ML PO SYRP: 5.0000 mL | ORAL_SOLUTION | Freq: Three times a day (TID) | ORAL | 0 refills | Status: DC | PRN

## 2017-07-01 MED ORDER — IPRATROPIUM BROMIDE 0.02 % IN SOLN
0.5000 mg | Freq: Once | RESPIRATORY_TRACT | Status: AC
  Administered 2017-07-01: 0.5 mg via RESPIRATORY_TRACT

## 2017-07-01 NOTE — Progress Notes (Signed)
MRN: [id number] DOB: 1956/09/26  Subjective:   Donna Spears is a 61 y.o. female presenting for chief complaint of Sinus Problem (x 1 week) and Cough (X 1 week- with nasal congestion) .  Reports one week history of sinus congestion, cough (mostly dry, no hemoptysis) and wheezing. She is most concerned about the cough because it has become more frequent. Has tried prednisone 10mg  x 3 days left over from preivous sickness and that helped a lot. Has also tried mucinex with no relief. Denies fever, ear pain, sore throat, shortness of breath and chest tightness, night sweats, chills, fatigue, nausea, vomiting, abdominal pain and diarrhea. Has had sick contact with grandchildren. Has history of seasonal allergies and history of irritant induced asthma. Typically controlled on zyrtec and flonase. She has an albuterol inhaler but has not used it in a long time. Denies smoking. Denies any other aggravating or relieving factors, no other questions or concerns.  Donna Spears has a current medication list which includes the following prescription(s): atorvastatin, beclomethasone, budesonide-formoterol, cetirizine, cyclobenzaprine, fluticasone, multiple vitamins-minerals, pantoprazole, prednisone, pristiq, venlafaxine, and ventolin hfa. Also is allergic to azithromycin.  Donna Spears  has a past medical history of Allergy; Anemia; Asthma; Depression; GERD (gastroesophageal reflux disease); and Hyperlipidemia. Also  has no past surgical history on file.   Objective:   Vitals: BP 132/82 (BP Location: Right Arm, Patient Position: Sitting, Cuff Size: Normal)   Pulse 78   Temp 97.6 F (36.4 C) (Oral)   Resp 18   Ht 5' 4.17" (1.63 m)   Wt 178 lb 9.6 oz (81 kg)   SpO2 98%   BMI 30.49 kg/m   Physical Exam  Constitutional: She is oriented to person, place, and time. She appears well-developed and well-nourished.  HENT:  Head: Normocephalic and atraumatic.  Right Ear: Tympanic membrane, external ear and ear canal  normal.  Left Ear: Tympanic membrane, external ear and ear canal normal.  Nose: Mucosal edema (mild) present. Right sinus exhibits no maxillary sinus tenderness and no frontal sinus tenderness. Left sinus exhibits no maxillary sinus tenderness and no frontal sinus tenderness.  Mouth/Throat: Uvula is midline, oropharynx is clear and moist and mucous membranes are normal. No tonsillar exudate.  Eyes: Conjunctivae are normal.  Neck: Normal range of motion.  Cardiovascular: Normal rate, regular rhythm and normal heart sounds.   Pulmonary/Chest: Effort normal. She has wheezes (moderate, throughout lung field). She has rhonchi (moderate, throughout lung field). She has no rales.  Lymphadenopathy:       Head (right side): No submental, no submandibular, no tonsillar, no preauricular, no posterior auricular and no occipital adenopathy present.       Head (left side): No submental, no submandibular, no tonsillar, no preauricular, no posterior auricular and no occipital adenopathy present.    She has no cervical adenopathy.       Right: No supraclavicular adenopathy present.       Left: No supraclavicular adenopathy present.  Neurological: She is alert and oriented to person, place, and time.  Skin: Skin is warm and dry.  Psychiatric: She has a normal mood and affect.  Vitals reviewed.   Results for orders placed or performed in visit on 07/01/17 (from the past 24 hour(s))  POCT CBC     Status: None   Collection Time: 07/01/17 12:49 PM  Result Value Ref Range   WBC 6.6 4.6 - 10.2 K/uL   Lymph, poc 2.2 0.6 - 3.4   POC LYMPH PERCENT 32.8 10 - 50 %L  MID (cbc) 0.5 0 - 0.9   POC MID % 8.2 0 - 12 %M   POC Granulocyte 3.9 2 - 6.9   Granulocyte percent 59.0 37 - 80 %G   RBC 5.43 4.04 - 5.48 M/uL   Hemoglobin 16.2 12.2 - 16.2 g/dL   HCT, POC 09.846.5 11.937.7 - 47.9 %   MCV 85.5 80 - 97 fL   MCH, POC 29.9 27 - 31.2 pg   MCHC 35.0 31.8 - 35.4 g/dL   RDW, POC 14.713.4 %   Platelet Count, POC 229 142 - 424  K/uL   MPV 8.1 0 - 99.8 fL     Dg Chest 2 View  Result Date: 07/01/2017 CLINICAL DATA:  Cough for 1 week. EXAM: CHEST  2 VIEW COMPARISON:  01/24/2013 FINDINGS: There are bilateral chronic bronchitic changes. There is no focal parenchymal opacity. There is no pleural effusion or pneumothorax. The heart and mediastinal contours are unremarkable. The osseous structures are unremarkable. IMPRESSION: No active cardiopulmonary disease. Electronically Signed   By: Elige KoHetal  Patel   On: 07/01/2017 13:07   Post duoneb, pt notes she feels much better. Mild amount of wheezing and rhonchi auscultated on lung exam.   Assessment and Plan :  1. Wheezing Improving.  - POCT CBC - albuterol (PROVENTIL) (2.5 MG/3ML) 0.083% nebulizer solution 2.5 mg; Take 3 mLs (2.5 mg total) by nebulization once. - ipratropium (ATROVENT) nebulizer solution 0.5 mg; Take 2.5 mLs (0.5 mg total) by nebulization once. - DG Chest 2 View; Future - predniSONE (DELTASONE) 20 MG tablet; Take 2 tablets (40 mg total) by mouth daily with breakfast.  Dispense: 10 tablet; Refill: 0 - ipratropium (ATROVENT HFA) 17 MCG/ACT inhaler; Inhale 2 puffs into the lungs every 4 (four) hours as needed for wheezing.  Dispense: 1 Inhaler; Refill: 0  2. Cough POCT testing and CXR findings are reassuring. Consistent with bronchitis with viral etiology. Will treat symptomatically. Pt instructed to return to clinic if symptoms worsen, do not improve in 5-7 days, or as needed. - benzonatate (TESSALON) 100 MG capsule; Take 1-2 capsules (100-200 mg total) by mouth 3 (three) times daily as needed for cough.  Dispense: 40 capsule; Refill: 0 - HYDROcodone-homatropine (HYCODAN) 5-1.5 MG/5ML syrup; Take 5 mLs by mouth every 8 (eight) hours as needed for cough.  Dispense: 120 mL; Refill: 0  Benjiman CoreBrittany Berdia Lachman, PA-C  [facility] 07/01/2017 1:16 PM

## 2017-07-01 NOTE — Patient Instructions (Addendum)
Your xray did not show any active pneumonia and your white count was normal, which is great! This is likely acute viral bronchitis which is causing you to have some wheezing. I have given you a prescription for prednisone, which should help.Prednisone is a steroid and can cause side effects such as headache, irritability, nausea, vomiting, increased heart rate, increased blood pressure, increased blood sugar, appetite changes, and insomnia. Please take tablets in the morning with a full meal to help decrease the chances of these side effects. I also want you to use your albuterol inhaler every 4-6 hours as needed for wheezing. I have also given you an atrovent inhaler which is the other medication that is in the breathing treatment you had in office. You can use both of these inhalers at the same time. I have also given a prescription for tessalon perles, which you can use for the cough. Please return to clinic if symptoms worsen, do not improve in 5-7 days, or as needed  Bronchospasm, Adult Bronchospasm is when airways in the lungs get smaller. When this happens, it can be hard to breathe. You may cough. You may also make a whistling sound when you breathe (wheeze). Follow these instructions at home: Medicines  Take over-the-counter and prescription medicines only as told by your doctor.  If you need to use an inhaler or nebulizer to take your medicine, ask your doctor how to use it.  If you were given a spacer, always use it with your inhaler. Lifestyle  Change your heating and air conditioning filter. Do this at least once a month.  Try not to use fireplaces and wood stoves.  Do not  smoke. Do not  allow smoking in your home.  Try not to use things that have a strong smell, like perfume.  Get rid of pests (such as roaches and mice) and their poop.  Remove any mold from your home.  Keep your house clean. Get rid of dust.  Use cleaning products that have no smell.  Replace carpet with  wood, tile, or vinyl flooring.  Use allergy-proof pillows, mattress covers, and box spring covers.  Wash bed sheets and blankets every week. Use hot water. Dry them in a dryer.  Use blankets that are made of polyester or cotton.  Wash your hands often.  Keep pets out of your bedroom.  When you exercise, try not to breathe in cold air. General instructions  Have a plan for getting medical care. Know these things: ? When to call your doctor. ? When to call local emergency services (911 in the [place] ? Where to go in an emergency.  Stay up to date on your shots (immunizations).  When you have an episode: ? Stay calm. ? Relax. ? Breathe slowly. Contact a doctor if:  Your muscles ache.  Your chest hurts.  The color of the mucus you cough up (sputum) changes from clear or white to yellow, green, gray, or bloody.  The mucus you cough up gets thicker.  You have a fever. Get help right away if:  The whistling sound gets worse, even after you take your medicines.  Your coughing gets worse.  You find it even harder to breathe.  Your chest hurts very much. Summary  Bronchospasm is when airways in the lungs get smaller.  When this happens, it can be hard to breathe. You may cough. You may also make a whistling sound when you breathe.  Stay away from things that cause you to have  episodes. These include smoke or dust. This information is not intended to replace advice given to you by your health care provider. Make sure you discuss any questions you have with your health care provider. Document Released: 10/07/2009 Document Revised: 12/13/2016 Document Reviewed: 12/13/2016 Elsevier Interactive Patient Education  2017 [employer]    IF you received an x-ray today, you will receive an invoice from [facility]. Please contact [facility] at [phone] with questions or concerns regarding your invoice.   IF you received labwork today, you will receive  an invoice from [facility]. Please contact [facility] at [phone] with questions or concerns regarding your invoice.   Our billing staff will not be able to assist you with questions regarding bills from these companies.  You will be contacted with the lab results as soon as they are available. The fastest way to get your results is to activate your My Chart account. Instructions are located on the last page of this paperwork. If you have not heard from us regarding the results in 2 weeks, please contact this office.

## 2017-08-08 ENCOUNTER — Telehealth: Payer: Self-pay | Admitting: Internal Medicine

## 2017-08-08 MED ORDER — FLUTICASONE PROPIONATE 50 MCG/ACT NA SUSP: 2.0000 | Freq: Two times a day (BID) | NASAL | 3 refills | Status: DC

## 2017-08-08 NOTE — Telephone Encounter (Signed)
Donna Spears spoke with pt and rescheduled CPE

## 2017-08-08 NOTE — Telephone Encounter (Signed)
Refill x one year °

## 2017-08-08 NOTE — Telephone Encounter (Signed)
Requesting refill on her Flonase 50 mcg nasal spray.  She is going out of town next week.  She is going to run out of what she has and would like to get a refill so she has one to take with her on her trip.  She wants to use a local pharmacy instead of her mail order because she doesn't have time to wait on the mail order.    Pharmacy:  [facility] on [address]  Best number for contact:  [phone]

## 2017-08-08 NOTE — Telephone Encounter (Signed)
Medication is sent, called pt to remind her of CPE

## 2017-08-13 ENCOUNTER — Other Ambulatory Visit: Payer: 59 | Admitting: Internal Medicine

## 2017-08-14 ENCOUNTER — Other Ambulatory Visit: Payer: Self-pay | Admitting: Internal Medicine

## 2017-08-15 ENCOUNTER — Encounter: Payer: 59 | Admitting: Internal Medicine

## 2017-08-31 ENCOUNTER — Other Ambulatory Visit: Payer: Self-pay | Admitting: Internal Medicine

## 2017-09-14 ENCOUNTER — Other Ambulatory Visit: Payer: Self-pay | Admitting: Internal Medicine

## 2017-09-15 ENCOUNTER — Encounter: Payer: Self-pay | Admitting: Internal Medicine

## 2017-09-15 ENCOUNTER — Telehealth: Payer: Self-pay | Admitting: Internal Medicine

## 2017-09-15 MED ORDER — DOXYCYCLINE HYCLATE 100 MG PO TABS: 100.0000 mg | ORAL_TABLET | Freq: Two times a day (BID) | ORAL | 0 refills | Status: DC

## 2017-09-15 NOTE — Telephone Encounter (Signed)
Patient called to say that she had apparently been bitten  by an insect earlier today. She did not feel any stinger bite. It is on her leg. There is significant erythema and increased warmth. We are going to start her on doxycycline 100 mg twice daily for 10 days and she will come in tomorrow at 9:30 AM to be seen.

## 2017-09-16 ENCOUNTER — Encounter: Payer: Self-pay | Admitting: Internal Medicine

## 2017-09-16 ENCOUNTER — Ambulatory Visit (INDEPENDENT_AMBULATORY_CARE_PROVIDER_SITE_OTHER): Payer: 59 | Admitting: Internal Medicine

## 2017-09-16 VITALS — BP 110/60 | HR 78 | Temp 98.2°F | Wt 188.0 lb

## 2017-09-16 DIAGNOSIS — T63301A Toxic effect of unspecified spider venom, accidental (unintentional), initial encounter: Secondary | ICD-10-CM

## 2017-09-16 NOTE — Patient Instructions (Signed)
Continue doxycycline as prescribed last evening to complete a 10 day course. Tetanus immunization is up-to-date. Call later in the week for progress report.

## 2017-09-16 NOTE — Progress Notes (Signed)
   Subjective:    Patient ID: Donna Spears, female    DOB: [birth year], 61 y.o.   MRN: [id number]  HPI she called last evening shortly after 8 PM seen she's been working out in the yard and had noticed lesion left anterior thigh that was read that looked like an insect bite. She was concerned it could be a brown recluse spider bite. She was anxious. We called in doxycycline for her with instructions to come to the office today for follow-up. She did well last night. No fever or chills. However lesion is itchy she says.    Review of Systems see above     Objective:   Physical Exam  Approximate 3 cm circular lesion that is macular with erythema with central vesicle and also has a linear area extending outward at about 8:00 measuring about 2 cm      Assessment & Plan:  Manson Passey recluse spider bite  Plan: Continue 10 day course of doxycycline as previously prescribed last evening. Keep area clean warm and dry. Tetanus immunization is up-to-date. Call with progress report later in the week.

## 2017-09-17 ENCOUNTER — Telehealth: Payer: Self-pay | Admitting: Internal Medicine

## 2017-09-17 NOTE — Telephone Encounter (Signed)
Inetta Fermo w/[facility] is calling about a Flonase Nasal Spray prescription.  It's prescribed two sprays twice a day which is over max dose.  They didn't catch it at that time.  It's usually one spray twice a day.  They want to verify the dose. Please return call and refer to reference #[id number].

## 2017-09-17 NOTE — Telephone Encounter (Signed)
Please correct this 

## 2017-09-18 MED ORDER — FLUTICASONE PROPIONATE 50 MCG/ACT NA SUSP: 1.0000 | Freq: Two times a day (BID) | NASAL | 3 refills | Status: DC

## 2017-09-18 NOTE — Telephone Encounter (Signed)
Resent as correct rx, number given to call back is not a working number

## 2017-10-08 ENCOUNTER — Other Ambulatory Visit: Payer: 59 | Admitting: Internal Medicine

## 2017-10-08 DIAGNOSIS — F329 Major depressive disorder, single episode, unspecified: Secondary | ICD-10-CM

## 2017-10-08 DIAGNOSIS — Z0000 Encounter for general adult medical examination without abnormal findings: Secondary | ICD-10-CM

## 2017-10-08 DIAGNOSIS — E782 Mixed hyperlipidemia: Secondary | ICD-10-CM

## 2017-10-08 DIAGNOSIS — F32A Depression, unspecified: Secondary | ICD-10-CM

## 2017-10-08 DIAGNOSIS — Z1321 Encounter for screening for nutritional disorder: Secondary | ICD-10-CM

## 2017-10-08 DIAGNOSIS — J309 Allergic rhinitis, unspecified: Secondary | ICD-10-CM

## 2017-10-08 DIAGNOSIS — K219 Gastro-esophageal reflux disease without esophagitis: Secondary | ICD-10-CM

## 2017-10-08 DIAGNOSIS — Z1329 Encounter for screening for other suspected endocrine disorder: Secondary | ICD-10-CM

## 2017-10-09 LAB — CBC WITH DIFFERENTIAL/PLATELET
BASOS PCT: 1.2 %
Basophils Absolute: 59 cells/uL (ref 0–200)
Eosinophils Absolute: 59 cells/uL (ref 15–500)
Eosinophils Relative: 1.2 %
HEMATOCRIT: 42.5 % (ref 35.0–45.0)
HEMOGLOBIN: 14.6 g/dL (ref 11.7–15.5)
LYMPHS ABS: 1539 {cells}/uL (ref 850–3900)
MCH: 30 pg (ref 27.0–33.0)
MCHC: 34.4 g/dL (ref 32.0–36.0)
MCV: 87.3 fL (ref 80.0–100.0)
MPV: 10.4 fL (ref 7.5–12.5)
Monocytes Relative: 11.6 %
NEUTROS ABS: 2675 {cells}/uL (ref 1500–7800)
NEUTROS PCT: 54.6 %
Platelets: 222 10*3/uL (ref 140–400)
RBC: 4.87 10*6/uL (ref 3.80–5.10)
RDW: 12.3 % (ref 11.0–15.0)
Total Lymphocyte: 31.4 %
WBC: 4.9 10*3/uL (ref 3.8–10.8)
WBCMIX: 568 {cells}/uL (ref 200–950)

## 2017-10-09 LAB — LIPID PANEL
CHOLESTEROL: 177 mg/dL (ref <200)
HDL: 44 mg/dL — AB (ref >50)
LDL Cholesterol (Calc): 105 mg/dL (calc) — ABNORMAL HIGH
Non-HDL Cholesterol (Calc): 133 mg/dL (calc) — ABNORMAL HIGH (ref <130)
TRIGLYCERIDES: 161 mg/dL — AB (ref <150)
Total CHOL/HDL Ratio: 4 (calc) (ref <5.0)

## 2017-10-09 LAB — COMPLETE METABOLIC PANEL WITH GFR
AG Ratio: 1.6 (calc) (ref 1.0–2.5)
ALBUMIN MSPROF: 4.1 g/dL (ref 3.6–5.1)
ALT: 31 U/L — ABNORMAL HIGH (ref 6–29)
AST: 23 U/L (ref 10–35)
Alkaline phosphatase (APISO): 95 U/L (ref 33–130)
BUN: 15 mg/dL (ref 7–25)
CALCIUM: 9.2 mg/dL (ref 8.6–10.4)
CO2: 28 mmol/L (ref 20–32)
Chloride: 103 mmol/L (ref 98–110)
Creat: 0.6 mg/dL (ref 0.50–0.99)
GFR, EST AFRICAN AMERICAN: 113 mL/min/{1.73_m2} (ref >=60)
GFR, EST NON AFRICAN AMERICAN: 98 mL/min/{1.73_m2} (ref >=60)
GLOBULIN: 2.6 g/dL (ref 1.9–3.7)
Glucose, Bld: 89 mg/dL (ref 65–99)
POTASSIUM: 4.4 mmol/L (ref 3.5–5.3)
SODIUM: 139 mmol/L (ref 135–146)
TOTAL PROTEIN: 6.7 g/dL (ref 6.1–8.1)
Total Bilirubin: 0.6 mg/dL (ref 0.2–1.2)

## 2017-10-09 LAB — VITAMIN D 25 HYDROXY (VIT D DEFICIENCY, FRACTURES): VIT D 25 HYDROXY: 30 ng/mL (ref 30–100)

## 2017-10-09 LAB — TSH: TSH: 2.08 m[IU]/L (ref 0.40–4.50)

## 2017-10-10 ENCOUNTER — Ambulatory Visit (INDEPENDENT_AMBULATORY_CARE_PROVIDER_SITE_OTHER): Payer: 59 | Admitting: Internal Medicine

## 2017-10-10 ENCOUNTER — Encounter: Payer: Self-pay | Admitting: Internal Medicine

## 2017-10-10 VITALS — BP 120/80 | HR 73 | Temp 98.4°F | Ht 63.0 in | Wt 185.0 lb

## 2017-10-10 DIAGNOSIS — E782 Mixed hyperlipidemia: Secondary | ICD-10-CM

## 2017-10-10 DIAGNOSIS — R7302 Impaired glucose tolerance (oral): Secondary | ICD-10-CM

## 2017-10-10 DIAGNOSIS — Z8659 Personal history of other mental and behavioral disorders: Secondary | ICD-10-CM | POA: Diagnosis not present

## 2017-10-10 DIAGNOSIS — Z23 Encounter for immunization: Secondary | ICD-10-CM | POA: Diagnosis not present

## 2017-10-10 DIAGNOSIS — R0989 Other specified symptoms and signs involving the circulatory and respiratory systems: Secondary | ICD-10-CM

## 2017-10-10 DIAGNOSIS — K219 Gastro-esophageal reflux disease without esophagitis: Secondary | ICD-10-CM

## 2017-10-10 DIAGNOSIS — J301 Allergic rhinitis due to pollen: Secondary | ICD-10-CM | POA: Diagnosis not present

## 2017-10-10 DIAGNOSIS — Z0000 Encounter for general adult medical examination without abnormal findings: Secondary | ICD-10-CM | POA: Diagnosis not present

## 2017-10-10 DIAGNOSIS — R197 Diarrhea, unspecified: Secondary | ICD-10-CM | POA: Diagnosis not present

## 2017-10-10 LAB — POCT URINALYSIS DIPSTICK
BILIRUBIN UA: NEGATIVE
GLUCOSE UA: NEGATIVE
KETONES UA: NEGATIVE
Leukocytes, UA: NEGATIVE
Nitrite, UA: NEGATIVE
Protein, UA: NEGATIVE
RBC UA: NEGATIVE
SPEC GRAV UA: 1.02 (ref 1.010–1.025)
UROBILINOGEN UA: 0.2 U/dL
pH, UA: 7 (ref 5.0–8.0)

## 2017-10-10 NOTE — Progress Notes (Signed)
Subjective:    Patient ID: Donna Spears, female    DOB: Oct 15, 1956, 61 y.o.   MRN: [id number]  HPI 60  Year old Female for health maintenance exam and evaluation of medical issues.  Needs mammogram. Has not seen GYN recently.Reminded to do so.   Colonoscopy done in 2016. Continues to have issues of frequent  diarrhea treated with Bentyl. This started around 2015. Lactoferrin was positive. C. difficile and O&P were negative. Stool culture was negative. Sprue panel was unremarkable. Suggest she consult Gastroenterology. Colonoscopy did not reveal any evidence of colitis. Family history colon cancer in mother.  Recently seen for insect bite on leg. Improved with antibiotic.  History of depression stable on SSRI.  History of GE reflux treated with PPI. History of hyperlipidemia treated with Lipitor.  Dr. Huel Cote is GYN physician.  Dr. Ewing Schlein is Gastroenterologist.  No known drug allergies.  History of allergic rhinitis and fibrocystic breast disease. Had right trigger finger release by Dr. Mina Marble.  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 She has a college degree and is married to a retired Runner, broadcasting/film/video. Does not smoke or consume alcohol. She grandchildren. She walks several miles weekly. 2 adult children, a son and a daughter.  Family history: Mother with history of heart disease, lung cancer as well as colon cancer. One brother with history of coronary artery disease.  Patient had a dog bite to her right hand during the summer of 2015. Rabies immunization had expired on the dog. We checked with infectious disease consultant and it was felt that rabies immunization for the patient was not necessary as the expiration date on the dog's rabies vaccine was only a couple of months before the bite.   Review of Systems  Constitutional: Negative.   Respiratory: Negative.   Cardiovascular: Negative.   Gastrointestinal:       Takes  probiotic but has diarrhea. Takes Bentyl.  Genitourinary: Negative.   Musculoskeletal: Negative.   Neurological: Negative.   Psychiatric/Behavioral:       History of depression treated with SSRI       Objective:   Physical Exam  Constitutional: She is oriented to person, place, and time. She appears well-developed and well-nourished. No distress.  HENT:  Head: Normocephalic and atraumatic.  Right Ear: External ear normal.  Left Ear: External ear normal.  Mouth/Throat: Oropharynx is clear and moist.  Eyes: Pupils are equal, round, and reactive to light. Conjunctivae and EOM are normal. Right eye exhibits no discharge. Left eye exhibits no discharge. No scleral icterus.  Neck: Neck supple. No JVD present. No thyromegaly present.  New finding of left carotid bruit  Cardiovascular: Normal rate, regular rhythm, normal heart sounds and intact distal pulses.   No murmur heard. Pulmonary/Chest: Effort normal and breath sounds normal. No respiratory distress. She has no wheezes.  Breasts normal female  Abdominal: Soft. Bowel sounds are normal. She exhibits no distension and no mass. There is no tenderness. There is no rebound and no guarding.  Genitourinary:  Genitourinary Comments: Deferred to GYN  Musculoskeletal: She exhibits no edema.  Lymphadenopathy:    She has no cervical adenopathy.  Neurological: She is alert and oriented to person, place, and time. She has normal reflexes. No cranial nerve deficit. Coordination normal.  Skin: Skin is warm and dry. No rash noted. She is not diaphoretic.  Psychiatric: She has a normal mood and affect. Her behavior is normal. Judgment and thought content normal.  Vitals reviewed.         Assessment & Plan:  History of depression-stable with Effexor  Diarrhea treated with Bentyl. Recommend she see gastroenterologist for discussion and evaluation. Colonoscopy in 2016 was normal. However had positive fecal lactoferrin in 2015.  Recent spider  bite leg resolved  Hyperlipidemia-triglycerides slightly elevated. Continue to work on diet exercise and continue statin medication  Health maintenance-she should see GYN physician and have mammogram.  Health maintenance-flu vaccine given. Needs mammogram and needs to see GYN physician.  History of impaired glucose tolerance-hemoglobin A1c not checked today but serum fasting glucose is normal  GE reflux-stable on PPI  Left carotid bruit-new finding to have carotid Doppler study.

## 2017-10-10 NOTE — Patient Instructions (Addendum)
To see GI re: diarrhea. Get mammogram. Flu vaccine today. Have carotid Doppler study. See GYN physician.

## 2017-10-14 ENCOUNTER — Ambulatory Visit (HOSPITAL_COMMUNITY)
Admission: RE | Admit: 2017-10-14 | Discharge: 2017-10-14 | Disposition: A | Discharge status: Home / Self Care | Payer: 59 | Source: Ambulatory Visit | Attending: Cardiology | Admitting: Cardiology

## 2017-10-14 DIAGNOSIS — I6523 Occlusion and stenosis of bilateral carotid arteries: Secondary | ICD-10-CM | POA: Insufficient documentation

## 2017-10-14 DIAGNOSIS — R0989 Other specified symptoms and signs involving the circulatory and respiratory systems: Secondary | ICD-10-CM | POA: Insufficient documentation

## 2017-12-03 ENCOUNTER — Other Ambulatory Visit: Payer: Self-pay | Admitting: Internal Medicine

## 2017-12-04 DIAGNOSIS — L821 Other seborrheic keratosis: Secondary | ICD-10-CM | POA: Diagnosis not present

## 2017-12-04 DIAGNOSIS — L918 Other hypertrophic disorders of the skin: Secondary | ICD-10-CM | POA: Diagnosis not present

## 2017-12-04 DIAGNOSIS — Z1283 Encounter for screening for malignant neoplasm of skin: Secondary | ICD-10-CM | POA: Diagnosis not present

## 2017-12-09 ENCOUNTER — Telehealth: Payer: Self-pay

## 2017-12-09 NOTE — Telephone Encounter (Signed)
Suggest orthopedist- where has she been before? We do not have an appt this until after Christmas

## 2017-12-09 NOTE — Telephone Encounter (Signed)
Patient called complains of knee pain that has been going on for "a while" now, she said she would like to see you. Please advise.

## 2017-12-09 NOTE — Telephone Encounter (Signed)
Patient has called back again at 9:36 a.m. Wanting to know when she will receive a call back.  Advised it will be after 10:00 a.m.  We have a waiting room full of patients and it's a busy Monday morning.    She wants to make sure that she is called back on her CELL #:  [phone] PLEASE!    Thank you.

## 2017-12-09 NOTE — Telephone Encounter (Signed)
Appointment put on the book for 12:30 for 12/18.

## 2017-12-09 NOTE — Telephone Encounter (Signed)
Will see tomorrow. Pt aware

## 2017-12-10 ENCOUNTER — Ambulatory Visit
Admission: RE | Admit: 2017-12-10 | Discharge: 2017-12-10 | Disposition: A | Discharge status: Home / Self Care | Payer: 59 | Source: Ambulatory Visit | Attending: Internal Medicine | Admitting: Internal Medicine

## 2017-12-10 ENCOUNTER — Ambulatory Visit: Payer: 59 | Admitting: Internal Medicine

## 2017-12-10 ENCOUNTER — Encounter: Payer: Self-pay | Admitting: Internal Medicine

## 2017-12-10 VITALS — BP 110/76 | HR 86 | Temp 98.7°F | Ht 63.0 in | Wt 181.0 lb

## 2017-12-10 DIAGNOSIS — M25562 Pain in left knee: Secondary | ICD-10-CM

## 2017-12-10 DIAGNOSIS — M179 Osteoarthritis of knee, unspecified: Secondary | ICD-10-CM | POA: Diagnosis not present

## 2017-12-10 MED ORDER — MELOXICAM 15 MG PO TABS: 15.0000 mg | ORAL_TABLET | Freq: Every day | ORAL | 0 refills | Status: DC

## 2017-12-10 MED ORDER — HYOSCYAMINE SULFATE SL 0.125 MG SL SUBL: SUBLINGUAL_TABLET | SUBLINGUAL | 1 refills | Status: DC

## 2017-12-10 NOTE — Progress Notes (Signed)
   Subjective:    Patient ID: Donna MuscatDonna Arif, female    DOB: 29-Sep-1956, 61 y.o.   MRN: [id number]  HPI She is concerned about left knee pain.  No documented injury.  Is concerned about end-stage osteoarthritis.  Has noticed some swelling left knee.  Some pain with certain motions.  After visit: X-rays left knee were obtained showing early spurring in the tibial spines and patellofemoral compartment.  No significant joint narrowing.  Review of Systems see above     Objective:   Physical Exam She has good range of motion in her left knee.  The right knee is slightly puffy.  Patella is ballotable.  There is slight  crepitus.  She has good range of motion and no significant quadriceps inhibition       Assessment & Plan:  Probable left knee osteoarthritis  Patellofemoral syndrome left knee  Plan: X-ray of left knee.  Meloxicam 15 mg daily.  Showed her quadriceps strengthening exercises which may help.  If symptoms persist, refer to orthopedist.  Addendum: She called back said she had some concerns about risk of taking meloxicam.

## 2017-12-23 NOTE — Patient Instructions (Addendum)
Meloxicam 15 mg daily.  Have x-ray of left knee.

## 2018-01-03 ENCOUNTER — Telehealth: Payer: Self-pay

## 2018-01-03 NOTE — Telephone Encounter (Signed)
Patient called to let us know that her knee isn't better and she would like to know if you can give her a "shot in the knee". CB: [phone]

## 2018-01-04 NOTE — Telephone Encounter (Signed)
No, she will need to see orthopedist of her choice.

## 2018-01-06 ENCOUNTER — Other Ambulatory Visit: Payer: Self-pay | Admitting: Internal Medicine

## 2018-01-08 NOTE — Telephone Encounter (Signed)
Left detailed message letting patient know of Dr. Beryle QuantBaxley's instructions.

## 2018-01-23 ENCOUNTER — Other Ambulatory Visit: Payer: Self-pay | Admitting: Internal Medicine

## 2018-02-11 DIAGNOSIS — R3121 Asymptomatic microscopic hematuria: Secondary | ICD-10-CM | POA: Diagnosis not present

## 2018-02-11 DIAGNOSIS — Z6832 Body mass index (BMI) 32.0-32.9, adult: Secondary | ICD-10-CM | POA: Diagnosis not present

## 2018-02-11 DIAGNOSIS — Z01419 Encounter for gynecological examination (general) (routine) without abnormal findings: Secondary | ICD-10-CM | POA: Diagnosis not present

## 2018-02-11 DIAGNOSIS — Z124 Encounter for screening for malignant neoplasm of cervix: Secondary | ICD-10-CM | POA: Diagnosis not present

## 2018-02-11 DIAGNOSIS — Z780 Asymptomatic menopausal state: Secondary | ICD-10-CM | POA: Diagnosis not present

## 2018-02-11 DIAGNOSIS — Z1231 Encounter for screening mammogram for malignant neoplasm of breast: Secondary | ICD-10-CM | POA: Diagnosis not present

## 2018-02-11 LAB — HM MAMMOGRAPHY

## 2018-02-15 ENCOUNTER — Other Ambulatory Visit: Payer: Self-pay | Admitting: Internal Medicine

## 2018-03-05 ENCOUNTER — Other Ambulatory Visit: Payer: Self-pay | Admitting: Internal Medicine

## 2018-03-05 DIAGNOSIS — M25562 Pain in left knee: Secondary | ICD-10-CM | POA: Diagnosis not present

## 2018-03-05 DIAGNOSIS — M1712 Unilateral primary osteoarthritis, left knee: Secondary | ICD-10-CM | POA: Diagnosis not present

## 2018-03-05 DIAGNOSIS — M170 Bilateral primary osteoarthritis of knee: Secondary | ICD-10-CM | POA: Diagnosis not present

## 2018-06-24 ENCOUNTER — Encounter: Payer: Self-pay | Admitting: Internal Medicine

## 2018-06-24 ENCOUNTER — Ambulatory Visit: Payer: 59 | Admitting: Internal Medicine

## 2018-06-24 VITALS — BP 110/80 | HR 85 | Temp 99.2°F | Ht 63.0 in | Wt 189.0 lb

## 2018-06-24 DIAGNOSIS — M62838 Other muscle spasm: Secondary | ICD-10-CM

## 2018-06-24 DIAGNOSIS — M542 Cervicalgia: Secondary | ICD-10-CM | POA: Diagnosis not present

## 2018-06-24 MED ORDER — PREDNISONE 10 MG PO TABS: ORAL_TABLET | ORAL | 0 refills | Status: DC

## 2018-06-24 MED ORDER — HYDROCODONE-ACETAMINOPHEN 5-325 MG PO TABS: 1.0000 | ORAL_TABLET | ORAL | 0 refills | Status: DC | PRN

## 2018-06-24 MED ORDER — CYCLOBENZAPRINE HCL 10 MG PO TABS: 10.0000 mg | ORAL_TABLET | Freq: Three times a day (TID) | ORAL | 5 refills | Status: DC | PRN

## 2018-06-28 ENCOUNTER — Other Ambulatory Visit: Payer: Self-pay | Admitting: Internal Medicine

## 2018-07-19 NOTE — Patient Instructions (Signed)
Flexeril 10 mg 3 times daily as needed for neck pain.  Hydrocodone APAP 5/325 sparingly every 4 hours as needed for moderate pain #20 with no refill.  Apply ice to her 3 times daily for 20 minutes to left neck.  Take prednisone and tapering course as directed.  Call if not improving in 24 hours.

## 2018-07-19 NOTE — Progress Notes (Signed)
   Subjective:    Patient ID: Donna Spears, female    DOB: Mar 13, 1956, 62 y.o.   MRN: [id number]  HPI In today with complaint of severe left neck pain onset yesterday not relieved with husband's Voltaren.  She had some Flexeril leftover and took that as well.  Not sure how neck pain developed.  Does not remember doing any heavy lifting.  Could have possibly had a "crick" in her neck from sleeping.  Says she cannot get any relief and is very anxious in the office today.    Review of Systems see above     Objective:   Physical Exam  Muscle strength in the upper extremities is normal as are deep tendon reflexes.  She has palpable left muscle spasm in the sternocleidomastoid muscle area.      Assessment & Plan:  Muscle spasm left sternocleidomastoid muscle  Left neck pain  Plan: Hydrocodone APAP 5/325 1 p.o. every 4 hours as needed moderate pain #20 no refill.  Refill Flexeril 10 mg up to 3 times daily as needed for neck pain.  Prednisone 10 mg (#21) going from 60 mg to 0 mg over 7 days.  Recommend ice to neck area 20 minutes 2 or 3 times daily.  Call if not improving in 24 hours.

## 2018-07-21 ENCOUNTER — Other Ambulatory Visit: Payer: Self-pay | Admitting: Internal Medicine

## 2018-08-23 ENCOUNTER — Other Ambulatory Visit: Payer: Self-pay | Admitting: Internal Medicine

## 2018-09-09 ENCOUNTER — Other Ambulatory Visit: Payer: Self-pay

## 2018-09-09 MED ORDER — BECLOMETHASONE DIPROPIONATE 80 MCG/ACT IN AERS: 1.0000 | INHALATION_SPRAY | Freq: Two times a day (BID) | RESPIRATORY_TRACT | 3 refills | Status: DC

## 2018-09-11 ENCOUNTER — Other Ambulatory Visit: Payer: Self-pay

## 2018-09-11 MED ORDER — BECLOMETHASONE DIPROPIONATE 80 MCG/ACT IN AERS: 1.0000 | INHALATION_SPRAY | Freq: Two times a day (BID) | RESPIRATORY_TRACT | 3 refills | Status: DC

## 2018-09-14 ENCOUNTER — Other Ambulatory Visit: Payer: Self-pay | Admitting: Internal Medicine

## 2018-09-16 ENCOUNTER — Other Ambulatory Visit: Payer: Self-pay

## 2018-09-16 MED ORDER — FLUTICASONE-SALMETEROL 250-50 MCG/DOSE IN AEPB: INHALATION_SPRAY | RESPIRATORY_TRACT | 3 refills | Status: DC

## 2018-10-10 DIAGNOSIS — J209 Acute bronchitis, unspecified: Secondary | ICD-10-CM | POA: Diagnosis not present

## 2018-10-25 LAB — GLUCOSE, POCT (MANUAL RESULT ENTRY): POC Glucose: 101 mg/dl — AB (ref 70–99)

## 2018-11-16 ENCOUNTER — Other Ambulatory Visit: Payer: Self-pay | Admitting: Internal Medicine

## 2018-12-08 ENCOUNTER — Other Ambulatory Visit: Payer: Self-pay | Admitting: Internal Medicine

## 2018-12-24 ENCOUNTER — Other Ambulatory Visit: Payer: Self-pay | Admitting: Internal Medicine

## 2019-01-07 ENCOUNTER — Other Ambulatory Visit: Payer: Self-pay | Admitting: Internal Medicine

## 2019-01-07 NOTE — Telephone Encounter (Signed)
Patient states she is out of the QVAR and was told by the pharmacist that this medication is no longer being made and to contact you for something else that is similar to this medication.

## 2019-01-27 ENCOUNTER — Other Ambulatory Visit: Payer: Self-pay | Admitting: Internal Medicine

## 2019-01-27 ENCOUNTER — Other Ambulatory Visit: Payer: Self-pay

## 2019-01-27 MED ORDER — BUDESONIDE-FORMOTEROL FUMARATE 160-4.5 MCG/ACT IN AERO: 2.0000 | INHALATION_SPRAY | Freq: Two times a day (BID) | RESPIRATORY_TRACT | 12 refills | Status: DC | PRN

## 2019-01-28 DIAGNOSIS — J111 Influenza due to unidentified influenza virus with other respiratory manifestations: Secondary | ICD-10-CM | POA: Diagnosis not present

## 2019-02-09 ENCOUNTER — Other Ambulatory Visit: Payer: Self-pay | Admitting: Internal Medicine

## 2019-02-27 ENCOUNTER — Other Ambulatory Visit: Payer: 59 | Admitting: Internal Medicine

## 2019-02-27 DIAGNOSIS — K219 Gastro-esophageal reflux disease without esophagitis: Secondary | ICD-10-CM | POA: Diagnosis not present

## 2019-02-27 DIAGNOSIS — R0989 Other specified symptoms and signs involving the circulatory and respiratory systems: Secondary | ICD-10-CM | POA: Diagnosis not present

## 2019-02-27 DIAGNOSIS — Z0000 Encounter for general adult medical examination without abnormal findings: Secondary | ICD-10-CM

## 2019-02-27 DIAGNOSIS — R7302 Impaired glucose tolerance (oral): Secondary | ICD-10-CM

## 2019-02-27 DIAGNOSIS — E782 Mixed hyperlipidemia: Secondary | ICD-10-CM

## 2019-02-28 LAB — CBC WITH DIFFERENTIAL/PLATELET
Absolute Monocytes: 590 cells/uL (ref 200–950)
Basophils Absolute: 60 cells/uL (ref 0–200)
Basophils Relative: 1.2 %
EOS PCT: 1 %
Eosinophils Absolute: 50 cells/uL (ref 15–500)
HCT: 42.3 % (ref 35.0–45.0)
Hemoglobin: 14.3 g/dL (ref 11.7–15.5)
Lymphs Abs: 1375 cells/uL (ref 850–3900)
MCH: 29.7 pg (ref 27.0–33.0)
MCHC: 33.8 g/dL (ref 32.0–36.0)
MCV: 87.8 fL (ref 80.0–100.0)
MONOS PCT: 11.8 %
MPV: 10.8 fL (ref 7.5–12.5)
Neutro Abs: 2925 cells/uL (ref 1500–7800)
Neutrophils Relative %: 58.5 %
Platelets: 231 10*3/uL (ref 140–400)
RBC: 4.82 10*6/uL (ref 3.80–5.10)
RDW: 12.8 % (ref 11.0–15.0)
Total Lymphocyte: 27.5 %
WBC: 5 10*3/uL (ref 3.8–10.8)

## 2019-02-28 LAB — LIPID PANEL
CHOLESTEROL: 195 mg/dL (ref <200)
HDL: 46 mg/dL — ABNORMAL LOW (ref >=50)
LDL Cholesterol (Calc): 121 mg/dL (calc) — ABNORMAL HIGH
Non-HDL Cholesterol (Calc): 149 mg/dL (calc) — ABNORMAL HIGH (ref <130)
Total CHOL/HDL Ratio: 4.2 (calc) (ref <5.0)
Triglycerides: 165 mg/dL — ABNORMAL HIGH (ref <150)

## 2019-02-28 LAB — COMPLETE METABOLIC PANEL WITH GFR
AG Ratio: 1.6 (calc) (ref 1.0–2.5)
ALKALINE PHOSPHATASE (APISO): 97 U/L (ref 37–153)
ALT: 32 U/L — ABNORMAL HIGH (ref 6–29)
AST: 32 U/L (ref 10–35)
Albumin: 4.1 g/dL (ref 3.6–5.1)
BUN: 13 mg/dL (ref 7–25)
CO2: 26 mmol/L (ref 20–32)
Calcium: 9.6 mg/dL (ref 8.6–10.4)
Chloride: 108 mmol/L (ref 98–110)
Creat: 0.67 mg/dL (ref 0.50–0.99)
GFR, Est African American: 108 mL/min/{1.73_m2} (ref >=60)
GFR, Est Non African American: 94 mL/min/{1.73_m2} (ref >=60)
Globulin: 2.5 g/dL (calc) (ref 1.9–3.7)
Glucose, Bld: 124 mg/dL — ABNORMAL HIGH (ref 65–99)
Potassium: 4.8 mmol/L (ref 3.5–5.3)
Sodium: 143 mmol/L (ref 135–146)
Total Bilirubin: 0.5 mg/dL (ref 0.2–1.2)
Total Protein: 6.6 g/dL (ref 6.1–8.1)

## 2019-02-28 LAB — VITAMIN D 25 HYDROXY (VIT D DEFICIENCY, FRACTURES): Vit D, 25-Hydroxy: 27 ng/mL — ABNORMAL LOW (ref 30–100)

## 2019-02-28 LAB — TSH: TSH: 1.99 mIU/L (ref 0.40–4.50)

## 2019-03-02 ENCOUNTER — Ambulatory Visit: Payer: 59 | Admitting: Internal Medicine

## 2019-03-02 ENCOUNTER — Other Ambulatory Visit: Payer: Self-pay

## 2019-03-02 ENCOUNTER — Encounter: Payer: Self-pay | Admitting: Internal Medicine

## 2019-03-02 VITALS — BP 120/80 | HR 82 | Temp 98.6°F | Ht 63.0 in | Wt 184.0 lb

## 2019-03-02 DIAGNOSIS — R0989 Other specified symptoms and signs involving the circulatory and respiratory systems: Secondary | ICD-10-CM

## 2019-03-02 DIAGNOSIS — Z0000 Encounter for general adult medical examination without abnormal findings: Secondary | ICD-10-CM | POA: Diagnosis not present

## 2019-03-02 DIAGNOSIS — E782 Mixed hyperlipidemia: Secondary | ICD-10-CM | POA: Diagnosis not present

## 2019-03-02 DIAGNOSIS — Z8659 Personal history of other mental and behavioral disorders: Secondary | ICD-10-CM

## 2019-03-02 DIAGNOSIS — E2839 Other primary ovarian failure: Secondary | ICD-10-CM | POA: Diagnosis not present

## 2019-03-02 DIAGNOSIS — K219 Gastro-esophageal reflux disease without esophagitis: Secondary | ICD-10-CM | POA: Diagnosis not present

## 2019-03-02 DIAGNOSIS — J301 Allergic rhinitis due to pollen: Secondary | ICD-10-CM

## 2019-03-02 DIAGNOSIS — K580 Irritable bowel syndrome with diarrhea: Secondary | ICD-10-CM

## 2019-03-02 DIAGNOSIS — R7302 Impaired glucose tolerance (oral): Secondary | ICD-10-CM | POA: Diagnosis not present

## 2019-03-02 LAB — POCT URINALYSIS DIPSTICK
Appearance: NEGATIVE
Bilirubin, UA: NEGATIVE
Blood, UA: NEGATIVE
Glucose, UA: NEGATIVE
KETONES UA: NEGATIVE
Leukocytes, UA: NEGATIVE
Nitrite, UA: NEGATIVE
ODOR: NEGATIVE
Protein, UA: NEGATIVE
Spec Grav, UA: 1.01 (ref 1.010–1.025)
Urobilinogen, UA: 0.2 E.U./dL
pH, UA: 6 (ref 5.0–8.0)

## 2019-03-02 MED ORDER — ATORVASTATIN CALCIUM 40 MG PO TABS: 40.0000 mg | ORAL_TABLET | Freq: Every day | ORAL | 3 refills | Status: DC

## 2019-03-02 NOTE — Progress Notes (Signed)
Subjective:    Patient ID: Donna Spears, female    DOB: [birth year], 63 y.o.   MRN: [id number]  HPI 63 year old Female for health maintenance exam and evaluation of medical issues. Has had left knee injected by Dr. Tana Felts PA  recently. May take Ibuprofen  Has left foot 5th toe toe nail fungus and will be referred to [facility].  Took flu vaccine but had flu like illness about a month ago and had positive flu  test at Urgent Care- took Tamiflu.  Glucose elevated. Hgb AIC elevated.SGPT slightly elevated at 32. Not watching diet. Make dietary referral.  Likes carbohydrates. Needs to see dietician.  Colonoscopy was done in 2016.  Has irritable bowel symptoms treated with Benadryl.  This started around 2015.  Lactoferrin was positive.  C. difficile and O&P were negative as well as stool culture.  Sprue panel was unremarkable.  Colonoscopy then did not reveal colitis.  Family history of colon cancer in mother.  History of depression stable on SSRI.  History of GE reflux treated with PPI.  History of hyperlipidemia treated with Lipitor.    Dr. Huel Cote is GYN physician.  Dr. Ewing Schlein his gastroenterologist.  No known drug allergies.  History of allergic rhinitis and fibrocystic breast disease.  Had right finger trigger release by Dr. Mina Marble in the remote past.  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  Has a college degree and is married to a retired Runner, broadcasting/film/video.  Does not smoke or consume alcohol.  She has grandchildren.  She walks several miles weekly.  2 adult children a son and a daughter.  Family history: Mother with history of heart disease, lung cancer as well as colon cancer.  One brother with history of coronary artery disease.  Patient had a dog bite to her right hand during the summer 2015.  Rabies immunization had expired on the dog at the time.  We checked an infectious disease consult and it was felt that rabies  immunization for the patient was not necessary as an expiration date on the dog's rabies vaccine was only a couple of months before the bite.    Review of Systems  Constitutional: Positive for fatigue.  Respiratory: Negative.   Cardiovascular: Negative.   Gastrointestinal: Positive for diarrhea.  Genitourinary: Negative.   Neurological: Negative.   Psychiatric/Behavioral:       History of depression longstanding       Objective:   Physical Exam Vitals signs reviewed.  Constitutional:      General: She is not in acute distress.    Appearance: Normal appearance.  HENT:     Head: Normocephalic and atraumatic.     Right Ear: Tympanic membrane and ear canal normal.     Left Ear: Tympanic membrane normal.     Nose: Nose normal. No congestion.     Mouth/Throat:     Mouth: Mucous membranes are dry.     Pharynx: Oropharynx is clear.  Eyes:     General:        Right eye: No discharge.        Left eye: No discharge.     Extraocular Movements: Extraocular movements intact.     Conjunctiva/sclera: Conjunctivae normal.     Pupils: Pupils are equal, round, and reactive to light.  Neck:     Musculoskeletal: Neck supple. No neck rigidity.     Comments: No thyromegaly.  History of left carotid bruit Cardiovascular:  Rate and Rhythm: Normal rate and regular rhythm.     Pulses: Normal pulses.     Heart sounds: Normal heart sounds. No murmur.  Pulmonary:     Effort: Pulmonary effort is normal.     Breath sounds: Normal breath sounds. No wheezing or rales.  Abdominal:     General: Bowel sounds are normal. There is no distension.     Palpations: There is no mass.     Tenderness: There is no guarding or rebound.  Genitourinary:    Comments: Deferred to GYN Musculoskeletal:     Right lower leg: No edema.     Left lower leg: No edema.  Skin:    General: Skin is warm and dry.  Neurological:     General: No focal deficit present.     Mental Status: She is alert and oriented to  person, place, and time.     Cranial Nerves: No cranial nerve deficit.     Sensory: No sensory deficit.     Coordination: Coordination normal.     Gait: Gait normal.  Psychiatric:        Mood and Affect: Mood normal.        Behavior: Behavior normal.        Thought Content: Thought content normal.        Judgment: Judgment normal.    Wt. 184 pounds.BP 120/80 pulse 82       Assessment & Plan:  History of depression stable with SSRI  Diarrhea treated with Levsin.  Colonoscopy in 2016 was normal.  Hyperlipidemia-continue to work on diet exercise and continue statin medication  History of impaired glucose tolerance-hemoglobin A1c 6.1%.  Follow-up in June.  Work on diet and exercise.  To see dietitian.  History of GE reflux treated with PPI  Left carotid bruit and hand ultrasound 2018 showing 1 to 39% stenosis  Health maintenance: Order given for Shingrix vaccine.  Plan:

## 2019-03-02 NOTE — Patient Instructions (Signed)
To see dietitian regarding impaired glucose tolerance and follow-up in 3 months.  Continue other medications as ordered.  We will get mammogram report from Dr. Olegario Messier Richardson's office.  Bone density study ordered.  Order given for Shingrix vaccine.

## 2019-03-03 ENCOUNTER — Encounter: Payer: Self-pay | Admitting: Internal Medicine

## 2019-03-03 LAB — HEMOGLOBIN A1C
Hgb A1c MFr Bld: 6.1 % of total Hgb — ABNORMAL HIGH (ref <5.7)
Mean Plasma Glucose: 128 (calc)
eAG (mmol/L): 7.1 (calc)

## 2019-03-06 ENCOUNTER — Other Ambulatory Visit: Payer: Self-pay | Admitting: Internal Medicine

## 2019-03-06 DIAGNOSIS — Z1231 Encounter for screening mammogram for malignant neoplasm of breast: Secondary | ICD-10-CM

## 2019-03-09 ENCOUNTER — Encounter: Payer: Self-pay | Admitting: Internal Medicine

## 2019-03-17 ENCOUNTER — Ambulatory Visit: Payer: 59 | Admitting: Dietician

## 2019-03-30 ENCOUNTER — Other Ambulatory Visit: Payer: Self-pay | Admitting: Internal Medicine

## 2019-05-06 ENCOUNTER — Other Ambulatory Visit: Payer: Self-pay | Admitting: Internal Medicine

## 2019-05-11 ENCOUNTER — Other Ambulatory Visit: Payer: 59

## 2019-05-11 ENCOUNTER — Ambulatory Visit: Payer: 59

## 2019-05-28 ENCOUNTER — Other Ambulatory Visit: Payer: 59 | Admitting: Internal Medicine

## 2019-05-28 DIAGNOSIS — R7302 Impaired glucose tolerance (oral): Secondary | ICD-10-CM

## 2019-05-29 ENCOUNTER — Other Ambulatory Visit: Payer: 59 | Admitting: Internal Medicine

## 2019-05-29 LAB — HEMOGLOBIN A1C
Hgb A1c MFr Bld: 5.9 % of total Hgb — ABNORMAL HIGH (ref <5.7)
Mean Plasma Glucose: 123 (calc)
eAG (mmol/L): 6.8 (calc)

## 2019-06-01 ENCOUNTER — Ambulatory Visit: Payer: 59 | Admitting: Internal Medicine

## 2019-06-01 ENCOUNTER — Encounter: Payer: Self-pay | Admitting: Internal Medicine

## 2019-06-01 ENCOUNTER — Other Ambulatory Visit: Payer: Self-pay

## 2019-06-01 VITALS — BP 130/82 | HR 73 | Ht 63.0 in | Wt 183.0 lb

## 2019-06-01 DIAGNOSIS — R7302 Impaired glucose tolerance (oral): Secondary | ICD-10-CM | POA: Diagnosis not present

## 2019-06-01 MED ORDER — CETIRIZINE HCL 10 MG PO TABS: 10.0000 mg | ORAL_TABLET | Freq: Every day | ORAL | 3 refills | Status: DC

## 2019-06-01 NOTE — Progress Notes (Signed)
   Subjective:    Patient ID: Donna Spears, female    DOB: Apr 20, 1956, 63 y.o.   MRN: [id number]  HPI She was here in March for health maintenance exam and evaluation of medical issues.  At that time she was noted to have a hemoglobin A1c of 6.1%.  She was advised to see a dietitian and follow-up now.  Her weight at that time was 184 pounds.  Her weight now is 183 pounds.  Her best A1c was in 2018 when it was 5.5%.  She has been trying hard during the pandemic to watch her diet but has not really lost any weight.  However her hemoglobin A1c has improved to 5.9%.  I am pleased with her progress.  She feels well.    Review of Systems see above no new complaints    Objective:   Physical Exam No thyromegaly.  Chest clear.  Cardiac exam regular rate and rhythm       Assessment & Plan:  Impaired glucose tolerance-improved with dietary management.  She does not want to be on metformin.  This was offered.  Plan: Continue to watch diet and exercise.  Follow-up in October with hemoglobin A1c at which time she will be eligible for flu vaccine.

## 2019-06-09 ENCOUNTER — Other Ambulatory Visit: Payer: Self-pay

## 2019-06-09 MED ORDER — FLUTICASONE-SALMETEROL 250-50 MCG/DOSE IN AEPB: INHALATION_SPRAY | RESPIRATORY_TRACT | 3 refills | Status: DC

## 2019-06-20 NOTE — Patient Instructions (Signed)
Continue to work on diet exercise and weight loss and follow-up in October

## 2019-06-23 ENCOUNTER — Ambulatory Visit: Payer: 59 | Admitting: Sports Medicine

## 2019-06-23 ENCOUNTER — Other Ambulatory Visit: Payer: Self-pay

## 2019-06-23 ENCOUNTER — Encounter: Payer: Self-pay | Admitting: Sports Medicine

## 2019-06-23 VITALS — BP 127/73 | HR 72 | Temp 98.7°F

## 2019-06-23 DIAGNOSIS — L603 Nail dystrophy: Secondary | ICD-10-CM | POA: Diagnosis not present

## 2019-06-23 DIAGNOSIS — B359 Dermatophytosis, unspecified: Secondary | ICD-10-CM

## 2019-06-23 DIAGNOSIS — L309 Dermatitis, unspecified: Secondary | ICD-10-CM

## 2019-06-23 MED ORDER — CLOTRIMAZOLE 1 % EX SOLN: 1.0000 "application " | Freq: Two times a day (BID) | CUTANEOUS | 5 refills | Status: DC

## 2019-06-23 MED ORDER — CLOBETASOL PROPIONATE 0.05 % EX CREA: 1.0000 "application " | TOPICAL_CREAM | Freq: Two times a day (BID) | CUTANEOUS | 0 refills | Status: AC

## 2019-06-23 NOTE — Progress Notes (Signed)
Subjective: Donna Spears is a 63 y.o. female patient who presents to office for evaluation of left foot skin changes and itchiness with thickness of 5th toenail that was dark but now is getting better. Was Rx Clobetasol cream by dermatologist which helped but reports she still gets itchiness and wants her foot checked. Patient denies any redness, warmth, swelling, drainage or signs of infection.  Review of Systems  All other systems reviewed and are negative.    Patient Active Problem List   Diagnosis Date Noted  . Pain in left knee 03/05/2018  . Obesity, unspecified 10/01/2013  . Allergic rhinitis 10/01/2013  . GE reflux 08/12/2012  . Depression 08/12/2012  . Hyperlipidemia, mixed 08/12/2012    Current Outpatient Medications on File Prior to Visit  Medication Sig Dispense Refill  . ADVAIR DISKUS 250-50 MCG/DOSE AEPB     . atorvastatin (LIPITOR) 40 MG tablet Take 1 tablet (40 mg total) by mouth daily. 90 tablet 3  . cetirizine (ZYRTEC) 10 MG tablet Take 1 tablet (10 mg total) by mouth daily. 90 tablet 3  . cyclobenzaprine (FLEXERIL) 10 MG tablet Take 1 tablet (10 mg total) by mouth 3 (three) times daily as needed for muscle spasms. 30 tablet 5  . fluticasone (FLONASE) 50 MCG/ACT nasal spray USE 1 SPRAY IN EACH NOSTRIL TWO TIMES DAILY 48 g 3  . Fluticasone-Salmeterol (ADVAIR DISKUS) 250-50 MCG/DOSE AEPB One spray PO EVERY 12 HOURS 1 each 3  . Hyoscyamine Sulfate SL (LEVSIN/SL) 0.125 MG SUBL One sublingual one half hour before meals 90 each 1  . ipratropium (ATROVENT HFA) 17 MCG/ACT inhaler Inhale 2 puffs into the lungs every 4 (four) hours as needed for wheezing. 1 Inhaler 0  . montelukast (SINGULAIR) 10 MG tablet     . Multiple Vitamins-Minerals (ALIVE WOMENS 50+ PO) Take by mouth.    . oseltamivir (TAMIFLU) 75 MG capsule     . pantoprazole (PROTONIX) 40 MG tablet TAKE 1 TABLET BY MOUTH  DAILY 90 tablet 0  . SYMBICORT 160-4.5 MCG/ACT inhaler     . venlafaxine (EFFEXOR) 75 MG tablet  TAKE 1 TABLET BY MOUTH  DAILY 90 tablet 3  . [DISCONTINUED] mometasone-formoterol (DULERA) 200-5 MCG/ACT AERO Inhale 2 puffs into the lungs 2 (two) times daily. 8.8 g 0   No current facility-administered medications on file prior to visit.     Allergies  Allergen Reactions  . Azithromycin Nausea And Vomiting  . Erythromycin Diarrhea  . Other     Objective:  General: Alert and oriented x3 in no acute distress  Dermatology: Mild scaly skin at plantar sulcus left foot, all nails x 10 are well manicured but 5th toe on left nail is thick with trauma lines present. .  Vascular: Dorsalis Pedis and Posterior Tibial pedal pulses 2/4, Capillary Fill Time 3 seconds, + pedal hair growth bilateral, no edema bilateral lower extremities, Temperature gradient within normal limits.  Neurology: Gross sensation intact via light touch bilateral.  Musculoskeletal: Varus 5th toes, Muscular strength 5/5 in all groups without pain or limitation on range of motion.  Assessment and Plan: Problem List Items Addressed This Visit    None    Visit Diagnoses    Dermatitis    -  Primary   Relevant Medications   clobetasol cream (TEMOVATE) 0.05 %   clotrimazole (LOTRIMIN) 1 % external solution   Tinea       Relevant Medications   oseltamivir (TAMIFLU) 75 MG capsule   clobetasol cream (TEMOVATE) 0.05 %  clotrimazole (LOTRIMIN) 1 % external solution   Nail dystrophy          -Complete examination performed -Discussed treatment options -Rx clotrimazole to use in between toes and recommend clotbetasol for redness or itching skin  -Encouraged daily skin emollients -Advised good supportive shoes and inserts -Patient to return to office as needed or sooner if condition worsens.  Asencion Islamitorya Jetaun Colbath, DPM

## 2019-07-08 ENCOUNTER — Ambulatory Visit
Admission: RE | Admit: 2019-07-08 | Discharge: 2019-07-08 | Disposition: A | Discharge status: Home / Self Care | Payer: 59 | Source: Ambulatory Visit | Attending: Internal Medicine | Admitting: Internal Medicine

## 2019-07-08 ENCOUNTER — Other Ambulatory Visit: Payer: Self-pay

## 2019-07-08 DIAGNOSIS — Z1231 Encounter for screening mammogram for malignant neoplasm of breast: Secondary | ICD-10-CM

## 2019-07-14 ENCOUNTER — Other Ambulatory Visit: Payer: Self-pay | Admitting: Sports Medicine

## 2019-07-14 MED ORDER — ECONAZOLE NITRATE 1 % EX CREA: TOPICAL_CREAM | CUTANEOUS | 0 refills | Status: DC

## 2019-07-14 NOTE — Progress Notes (Signed)
Change clotrimazole to econazole cream since this is a covered alternative

## 2019-07-30 ENCOUNTER — Other Ambulatory Visit: Payer: Self-pay | Admitting: Internal Medicine

## 2019-09-08 ENCOUNTER — Other Ambulatory Visit: Payer: Self-pay | Admitting: Internal Medicine

## 2019-09-08 ENCOUNTER — Telehealth: Payer: Self-pay | Admitting: Internal Medicine

## 2019-09-08 MED ORDER — FLUTICASONE-SALMETEROL 250-50 MCG/DOSE IN AEPB: INHALATION_SPRAY | RESPIRATORY_TRACT | 3 refills | Status: DC

## 2019-09-08 NOTE — Telephone Encounter (Signed)
Clarify if pt wants this or Symbicort and clarify in chart med list

## 2019-09-08 NOTE — Telephone Encounter (Signed)
She called to request this.

## 2019-09-08 NOTE — Telephone Encounter (Signed)
Donna Spears [phone]  [facility]  Fluticasone-Salmeterol (ADVAIR DISKUS) 250-50 MCG/DOSE AEPB  78 Day Supply   [name] called to say she needs a refill on above medication and the pharmacy said we had not sent it in to where she could get 90 day supply at a time.

## 2019-09-28 ENCOUNTER — Ambulatory Visit: Payer: 59 | Admitting: Internal Medicine

## 2019-10-02 ENCOUNTER — Ambulatory Visit: Payer: 59 | Admitting: Internal Medicine

## 2019-10-05 ENCOUNTER — Other Ambulatory Visit: Payer: 59 | Admitting: Internal Medicine

## 2019-10-06 ENCOUNTER — Other Ambulatory Visit: Payer: 59 | Admitting: Internal Medicine

## 2019-10-06 ENCOUNTER — Other Ambulatory Visit: Payer: Self-pay

## 2019-10-07 LAB — LIPID PANEL
Cholesterol: 180 mg/dL (ref <200)
HDL: 49 mg/dL — ABNORMAL LOW (ref >=50)
LDL Cholesterol (Calc): 103 mg/dL (calc) — ABNORMAL HIGH
Non-HDL Cholesterol (Calc): 131 mg/dL (calc) — ABNORMAL HIGH (ref <130)
Total CHOL/HDL Ratio: 3.7 (calc) (ref <5.0)
Triglycerides: 163 mg/dL — ABNORMAL HIGH (ref <150)

## 2019-10-07 LAB — HEMOGLOBIN A1C
Hgb A1c MFr Bld: 5.7 % of total Hgb — ABNORMAL HIGH (ref <5.7)
Mean Plasma Glucose: 117 (calc)
eAG (mmol/L): 6.5 (calc)

## 2019-10-09 ENCOUNTER — Ambulatory Visit: Payer: 59 | Admitting: Internal Medicine

## 2019-10-09 ENCOUNTER — Encounter: Payer: Self-pay | Admitting: Internal Medicine

## 2019-10-09 ENCOUNTER — Other Ambulatory Visit: Payer: Self-pay

## 2019-10-09 VITALS — BP 110/80 | HR 71 | Temp 98.0°F | Ht 63.0 in | Wt 185.0 lb

## 2019-10-09 DIAGNOSIS — F5105 Insomnia due to other mental disorder: Secondary | ICD-10-CM

## 2019-10-09 DIAGNOSIS — E782 Mixed hyperlipidemia: Secondary | ICD-10-CM | POA: Diagnosis not present

## 2019-10-09 DIAGNOSIS — F409 Phobic anxiety disorder, unspecified: Secondary | ICD-10-CM | POA: Diagnosis not present

## 2019-10-09 DIAGNOSIS — E7439 Other disorders of intestinal carbohydrate absorption: Secondary | ICD-10-CM | POA: Diagnosis not present

## 2019-10-09 NOTE — Patient Instructions (Addendum)
Continue same dose of Lipitor. Continue to watch diet and exercise. RTC in 6 months.

## 2019-10-09 NOTE — Progress Notes (Signed)
   Subjective:    Patient ID: Donna Spears, female    DOB: 1956-05-17, 63 y.o.   MRN: [id number]  HPI 63 year old Female for 6 month recheck.  History of hyperlipidemia and tolerance.  She is on Lipitor 40 mg daily.  She is not on glucose lowering medication.  Has been watching her diet but not exercising as much as she should.  Advised walking.  Having issues with insomnia on occasion.  May take over-the-counter antihistamine on occasion for insomnia.  Otherwise doing well during the pandemic.  History of GE reflux treated with Protonix.  Lipid panel shows normal total cholesterol, low HDL of 49, triglycerides 163 and LDL of 103.  Previously in March HDL was 46, triglycerides 165 and LDL 121.  She is showing improvement in LDL.  I am not going to change the dose of Lipitor at present time.    Review of Systems     Objective:   Physical Exam Vital signs reviewed.  Skin warm and dry.  Nodes none.  Chest clear.  Cardiac exam regular rate and rhythm.  Affect is normal.       Assessment & Plan:  Insomnia due to anxiety-may take occasional over-the-counter antihistamine for insomnia  Hyperlipidemia-continue to work on diet exercise and weight control.  Continue Lipitor 40 mg daily  Impaired glucose tolerance and continue diet and exercise efforts  Plan: Return March 2021 for physical examination.

## 2019-10-24 ENCOUNTER — Encounter: Payer: Self-pay | Admitting: Internal Medicine

## 2019-10-24 NOTE — Progress Notes (Signed)
appt cancelled

## 2019-10-24 NOTE — Patient Instructions (Signed)
Appointment cancelled

## 2019-12-30 ENCOUNTER — Other Ambulatory Visit: Payer: Self-pay | Admitting: Cardiology

## 2019-12-30 DIAGNOSIS — Z20822 Contact with and (suspected) exposure to covid-19: Secondary | ICD-10-CM

## 2019-12-31 LAB — NOVEL CORONAVIRUS, NAA: SARS-CoV-2, NAA: NOT DETECTED

## 2020-01-02 ENCOUNTER — Other Ambulatory Visit: Payer: Self-pay | Admitting: Internal Medicine

## 2020-02-04 ENCOUNTER — Other Ambulatory Visit: Payer: Self-pay | Admitting: Internal Medicine

## 2020-04-04 ENCOUNTER — Other Ambulatory Visit: Payer: 59 | Admitting: Internal Medicine

## 2020-04-07 ENCOUNTER — Other Ambulatory Visit: Payer: Medicare Other | Admitting: Internal Medicine

## 2020-04-07 ENCOUNTER — Other Ambulatory Visit: Payer: Self-pay

## 2020-04-08 ENCOUNTER — Encounter: Payer: Self-pay | Admitting: Internal Medicine

## 2020-04-08 ENCOUNTER — Ambulatory Visit (INDEPENDENT_AMBULATORY_CARE_PROVIDER_SITE_OTHER): Payer: Medicare Other | Admitting: Internal Medicine

## 2020-04-08 VITALS — BP 120/80 | HR 70 | Temp 98.0°F | Ht 63.0 in | Wt 189.0 lb

## 2020-04-08 DIAGNOSIS — K580 Irritable bowel syndrome with diarrhea: Secondary | ICD-10-CM | POA: Diagnosis not present

## 2020-04-08 DIAGNOSIS — E7439 Other disorders of intestinal carbohydrate absorption: Secondary | ICD-10-CM

## 2020-04-08 DIAGNOSIS — E782 Mixed hyperlipidemia: Secondary | ICD-10-CM

## 2020-04-08 DIAGNOSIS — K219 Gastro-esophageal reflux disease without esophagitis: Secondary | ICD-10-CM

## 2020-04-08 DIAGNOSIS — Z6833 Body mass index (BMI) 33.0-33.9, adult: Secondary | ICD-10-CM

## 2020-04-08 DIAGNOSIS — J301 Allergic rhinitis due to pollen: Secondary | ICD-10-CM

## 2020-04-08 DIAGNOSIS — Z8659 Personal history of other mental and behavioral disorders: Secondary | ICD-10-CM

## 2020-04-08 LAB — HEPATIC FUNCTION PANEL
AG Ratio: 1.7 (calc) (ref 1.0–2.5)
ALT: 29 U/L (ref 6–29)
AST: 24 U/L (ref 10–35)
Albumin: 4 g/dL (ref 3.6–5.1)
Alkaline phosphatase (APISO): 104 U/L (ref 37–153)
Bilirubin, Direct: 0.1 mg/dL (ref 0.0–0.2)
Globulin: 2.4 g/dL (calc) (ref 1.9–3.7)
Indirect Bilirubin: 0.4 mg/dL (calc) (ref 0.2–1.2)
Total Bilirubin: 0.5 mg/dL (ref 0.2–1.2)
Total Protein: 6.4 g/dL (ref 6.1–8.1)

## 2020-04-08 LAB — LIPID PANEL
Cholesterol: 193 mg/dL (ref <200)
HDL: 50 mg/dL (ref >=50)
LDL Cholesterol (Calc): 117 mg/dL (calc) — ABNORMAL HIGH
Non-HDL Cholesterol (Calc): 143 mg/dL (calc) — ABNORMAL HIGH (ref <130)
Total CHOL/HDL Ratio: 3.9 (calc) (ref <5.0)
Triglycerides: 138 mg/dL (ref <150)

## 2020-04-08 LAB — HEMOGLOBIN A1C
Hgb A1c MFr Bld: 6.1 % of total Hgb — ABNORMAL HIGH (ref <5.7)
Mean Plasma Glucose: 128 (calc)
eAG (mmol/L): 7.1 (calc)

## 2020-04-08 MED ORDER — HYOSCYAMINE SULFATE SL 0.125 MG SL SUBL: SUBLINGUAL_TABLET | SUBLINGUAL | 1 refills | Status: DC

## 2020-04-08 NOTE — Progress Notes (Signed)
   Subjective:    Patient ID: Donna Spears, female    DOB: [birth year], 64 y.o.   MRN: [id number]  HPI 64 year old Female for 6 month follow up.  Says insurance company does not want to pay for Advair anymore.  I have asked her to call insurance company and see what inhaler they prefer her to hand.  Says they do not want to cover Flonase nasal spray.  It is available over-the-counter if she would like to continue that or she can call them and see what they will cover.  I am not aware of what this company will cover.  She has a history of irritable bowel syndrome and needs Levsin refilled.  Asking what she can take for breakthrough GE reflux if PPI is not working.  She may take over-the-counter Pepcid.  Has been taking Tums.  With regard to hyperlipidemia LDL is 117 and previously was 103 in October 2020.  We will continue to work on diet and exercise.  She is on Lipitor 40 mg daily.  With regard to impaired glucose tolerance, hemoglobin A1c is 6.1% and previously was 5.7%.  Encourage diet exercise and watching her weight.  She is not on glucose lowering medication.  Liver functions are normal on statin medication.  .    Review of Systems May take Pepcid for breakthrough GERD on occasion     Objective:   Physical Exam Vital signs reviewed blood pressure 120/80, pulse 70, temperature 98 degrees orally pulse oximetry 98% weight 189 pounds.  BMI 33.4.. Neck is supple.  History of right carotid bruit.  Chest clear to auscultation.  Cardiac exam regular rate and rhythm normal S1 and S2.  Extremities without edema.  Affect is normal.      Assessment & Plan:  GE reflux-continue PPI May use Pepcid for breakthrough on occasion  Irritable bowel syndrome-Levsin refilled  Impaired glucose tolerance-hemoglobin A1c increased from 5.7% to 6.1% continue diet and exercise and follow-up in 6 months  Hyperlipidemia-LDL increased slightly from 10 3-1 17 on generic Lipitor 40 mg daily.  Encourage diet  and exercise.  Anxiety depression treated with Effexor and stable  Allergic rhinitis-she is to call insurance company and see what inhaler will be covered.  Currently on Advair.  Same is true for nasal spray.  Says Flonase may not be covered any longer.  However is available to purchase over-the-counter.  BMI 33.48-needs to diet exercise and lose weight  30 minutes spent answering questions and seeing patient and reviewing the labs with her as well and prescribing requested medication refill.  Physical exam, Medicare wellness, and fasting labs due in 6 months

## 2020-04-08 NOTE — Patient Instructions (Addendum)
May take Pepcid for occasional GERD breakthrough. Let us know alternative  inhaler that your insurance will cover in place of Advair and we can prescribe it for you. Watch diet and exercise. Labs discussed. RTC in 6 months for CPE and fasting labs.  Please work harder on diet exercise and weight loss over the next 6 months.

## 2020-04-30 ENCOUNTER — Other Ambulatory Visit: Payer: Self-pay | Admitting: Internal Medicine

## 2020-05-01 NOTE — Telephone Encounter (Signed)
She left office after visit recently and did not make follow up appointment. May need to contact.

## 2020-07-21 ENCOUNTER — Other Ambulatory Visit: Payer: Self-pay | Admitting: Internal Medicine

## 2020-07-27 ENCOUNTER — Other Ambulatory Visit: Payer: Self-pay

## 2020-07-27 ENCOUNTER — Encounter: Payer: Self-pay | Admitting: Internal Medicine

## 2020-07-27 ENCOUNTER — Ambulatory Visit (INDEPENDENT_AMBULATORY_CARE_PROVIDER_SITE_OTHER): Payer: Medicare Other | Admitting: Internal Medicine

## 2020-07-27 ENCOUNTER — Telehealth: Payer: Self-pay | Admitting: Internal Medicine

## 2020-07-27 DIAGNOSIS — M545 Low back pain, unspecified: Secondary | ICD-10-CM

## 2020-07-27 NOTE — Telephone Encounter (Signed)
Does she want virtual or in person visit to address?

## 2020-07-27 NOTE — Progress Notes (Addendum)
   Subjective:    Patient ID: Donna Spears, female    DOB: September 07, 1956, 64 y.o.   MRN: [id number]  HPI  64 year old Female seen today by interactive audio and video telecommunications due to the Coronavirus pandemic to make it due to her inability to drive to the office due to severe back pain.  Patient is identified using 2 identifiers as Donna Spears, a patient in this practice.  She is agreeable and grateful to visit in this format today.  Patient seen virtually in her home today, and I am at my office today.  Patient sent that on Monday, August 2 she bent down from the waist down to remove a heavy rock from her property.  She felt immediate pain in the lower mid back.  The pain did not radiate down her legs.  She has felt continuous pain since that time.  She had some leftover Flexeril which she took with minimal relief.  Describes no weakness or numbness in her legs just daily mid lower back pain that is very annoying.  It is interfering with her activities and her mobility.    Review of Systems she has no radiculopathy, numbness or weakness in the lower extremities.      Objective:   Physical Exam  Patient is moving slowly today on the virtual visit due to back pain soreness and stiffness.  Decreased range of motion in the trunk.  Straight leg raising is negative at 90 degrees bilaterally.  Unable to test muscle strength virtually but appears to be normal.      Assessment & Plan:  Acute midline low back pain secondary to heavy lifting  Plan: Meloxicam 15 mg 1 tablet p.o. daily with food.  Apply heat or ice to back whichever helps symptoms.  Avoid heavy lifting.  Continue normal activities as much as possible.  If symptoms are not improving we can order physical therapy for her.  I have refilled Flexeril 10 mg tablets.  She can take Flexeril up to 3 times daily but prefers to take it mainly at bedtime.

## 2020-07-27 NOTE — Telephone Encounter (Signed)
Virtual would be best.  °

## 2020-07-27 NOTE — Telephone Encounter (Signed)
230pm

## 2020-07-27 NOTE — Telephone Encounter (Signed)
scheduled

## 2020-07-27 NOTE — Telephone Encounter (Signed)
Donna Spears [phone]  Donna Spears called to say she picked up a big rock on Monday that she probably should not have, she is now having low back pain. Same place that it has been in the past. It hurts to move at times, sit, she has to be careful walking siting or laying. She found some old generic flexeril (bottle dated 11/2017) in the cabinet so she has been taking that.

## 2020-07-28 ENCOUNTER — Other Ambulatory Visit: Payer: Self-pay

## 2020-07-28 MED ORDER — MELOXICAM 15 MG PO TABS: 15.0000 mg | ORAL_TABLET | Freq: Every day | ORAL | 0 refills | Status: DC

## 2020-07-28 MED ORDER — CYCLOBENZAPRINE HCL 10 MG PO TABS: 10.0000 mg | ORAL_TABLET | Freq: Three times a day (TID) | ORAL | 5 refills | Status: DC | PRN

## 2020-07-28 NOTE — Telephone Encounter (Signed)
Patient is requesting a refill on flexeril, she said you were going to prescribe some type of aleve for her back?

## 2020-07-28 NOTE — Patient Instructions (Signed)
Take Flexeril 10 mg at bedtime.  Apply ice or heat to the back for relief of pain.  Avoid heavy lifting.  If pain does not improve within 7 to 10 days we can order physical therapy for you.  Take meloxicam 15 mg daily with a meal.

## 2020-08-20 ENCOUNTER — Other Ambulatory Visit: Payer: Self-pay | Admitting: Internal Medicine

## 2020-08-26 ENCOUNTER — Other Ambulatory Visit: Payer: Self-pay | Admitting: Internal Medicine

## 2020-09-19 ENCOUNTER — Other Ambulatory Visit: Payer: Medicare Other | Admitting: Internal Medicine

## 2020-09-19 ENCOUNTER — Other Ambulatory Visit: Payer: Self-pay

## 2020-09-19 DIAGNOSIS — K589 Irritable bowel syndrome without diarrhea: Secondary | ICD-10-CM

## 2020-09-19 DIAGNOSIS — K219 Gastro-esophageal reflux disease without esophagitis: Secondary | ICD-10-CM

## 2020-09-19 DIAGNOSIS — E782 Mixed hyperlipidemia: Secondary | ICD-10-CM

## 2020-09-19 DIAGNOSIS — Z0000 Encounter for general adult medical examination without abnormal findings: Secondary | ICD-10-CM

## 2020-09-19 DIAGNOSIS — J301 Allergic rhinitis due to pollen: Secondary | ICD-10-CM

## 2020-09-19 DIAGNOSIS — R7302 Impaired glucose tolerance (oral): Secondary | ICD-10-CM

## 2020-09-20 LAB — LIPID PANEL
Cholesterol: 199 mg/dL (ref <200)
HDL: 50 mg/dL (ref >=50)
LDL Cholesterol (Calc): 117 mg/dL (calc) — ABNORMAL HIGH
Non-HDL Cholesterol (Calc): 149 mg/dL (calc) — ABNORMAL HIGH (ref <130)
Total CHOL/HDL Ratio: 4 (calc) (ref <5.0)
Triglycerides: 204 mg/dL — ABNORMAL HIGH (ref <150)

## 2020-09-20 LAB — COMPLETE METABOLIC PANEL WITH GFR
AG Ratio: 1.9 (calc) (ref 1.0–2.5)
ALT: 30 U/L — ABNORMAL HIGH (ref 6–29)
AST: 21 U/L (ref 10–35)
Albumin: 4.1 g/dL (ref 3.6–5.1)
Alkaline phosphatase (APISO): 95 U/L (ref 37–153)
BUN: 17 mg/dL (ref 7–25)
CO2: 26 mmol/L (ref 20–32)
Calcium: 9.4 mg/dL (ref 8.6–10.4)
Chloride: 105 mmol/L (ref 98–110)
Creat: 0.62 mg/dL (ref 0.50–0.99)
GFR, Est African American: 110 mL/min/{1.73_m2} (ref >=60)
GFR, Est Non African American: 95 mL/min/{1.73_m2} (ref >=60)
Globulin: 2.2 g/dL (calc) (ref 1.9–3.7)
Glucose, Bld: 138 mg/dL — ABNORMAL HIGH (ref 65–99)
Potassium: 4.4 mmol/L (ref 3.5–5.3)
Sodium: 141 mmol/L (ref 135–146)
Total Bilirubin: 0.4 mg/dL (ref 0.2–1.2)
Total Protein: 6.3 g/dL (ref 6.1–8.1)

## 2020-09-20 LAB — CBC WITH DIFFERENTIAL/PLATELET
Absolute Monocytes: 539 cells/uL (ref 200–950)
Basophils Absolute: 59 cells/uL (ref 0–200)
Basophils Relative: 1.2 %
Eosinophils Absolute: 39 cells/uL (ref 15–500)
Eosinophils Relative: 0.8 %
HCT: 43.2 % (ref 35.0–45.0)
Hemoglobin: 14.5 g/dL (ref 11.7–15.5)
Lymphs Abs: 1303 cells/uL (ref 850–3900)
MCH: 30.2 pg (ref 27.0–33.0)
MCHC: 33.6 g/dL (ref 32.0–36.0)
MCV: 90 fL (ref 80.0–100.0)
MPV: 10.8 fL (ref 7.5–12.5)
Monocytes Relative: 11 %
Neutro Abs: 2960 cells/uL (ref 1500–7800)
Neutrophils Relative %: 60.4 %
Platelets: 203 10*3/uL (ref 140–400)
RBC: 4.8 10*6/uL (ref 3.80–5.10)
RDW: 12.4 % (ref 11.0–15.0)
Total Lymphocyte: 26.6 %
WBC: 4.9 10*3/uL (ref 3.8–10.8)

## 2020-09-20 LAB — HEMOGLOBIN A1C
Hgb A1c MFr Bld: 6.2 % of total Hgb — ABNORMAL HIGH (ref <5.7)
Mean Plasma Glucose: 131 (calc)
eAG (mmol/L): 7.3 (calc)

## 2020-09-20 LAB — TSH: TSH: 1.83 mIU/L (ref 0.40–4.50)

## 2020-09-26 ENCOUNTER — Other Ambulatory Visit: Payer: Self-pay

## 2020-09-26 ENCOUNTER — Encounter: Payer: Self-pay | Admitting: Internal Medicine

## 2020-09-26 ENCOUNTER — Ambulatory Visit (INDEPENDENT_AMBULATORY_CARE_PROVIDER_SITE_OTHER): Payer: Medicare Other | Admitting: Internal Medicine

## 2020-09-26 VITALS — BP 110/70 | HR 86 | Ht 63.0 in | Wt 188.0 lb

## 2020-09-26 DIAGNOSIS — J301 Allergic rhinitis due to pollen: Secondary | ICD-10-CM

## 2020-09-26 DIAGNOSIS — E7439 Other disorders of intestinal carbohydrate absorption: Secondary | ICD-10-CM

## 2020-09-26 DIAGNOSIS — Z0000 Encounter for general adult medical examination without abnormal findings: Secondary | ICD-10-CM | POA: Diagnosis not present

## 2020-09-26 DIAGNOSIS — R0989 Other specified symptoms and signs involving the circulatory and respiratory systems: Secondary | ICD-10-CM

## 2020-09-26 DIAGNOSIS — Z8659 Personal history of other mental and behavioral disorders: Secondary | ICD-10-CM | POA: Diagnosis not present

## 2020-09-26 DIAGNOSIS — E782 Mixed hyperlipidemia: Secondary | ICD-10-CM

## 2020-09-26 DIAGNOSIS — K580 Irritable bowel syndrome with diarrhea: Secondary | ICD-10-CM

## 2020-09-26 DIAGNOSIS — K219 Gastro-esophageal reflux disease without esophagitis: Secondary | ICD-10-CM

## 2020-09-26 LAB — POCT URINALYSIS DIPSTICK
Appearance: NEGATIVE
Bilirubin, UA: NEGATIVE
Blood, UA: NEGATIVE
Glucose, UA: NEGATIVE
Ketones, UA: NEGATIVE
Leukocytes, UA: NEGATIVE
Nitrite, UA: NEGATIVE
Odor: NEGATIVE
Protein, UA: NEGATIVE
Spec Grav, UA: 1.01 (ref 1.010–1.025)
Urobilinogen, UA: 0.2 E.U./dL
pH, UA: 6.5 (ref 5.0–8.0)

## 2020-09-26 MED ORDER — METFORMIN HCL 500 MG PO TABS: ORAL_TABLET | ORAL | 0 refills | Status: DC

## 2020-09-26 NOTE — Progress Notes (Signed)
Subjective:    Patient ID: Donna Spears, female    DOB: 1956/06/09, 64 y.o.   MRN: [id number]  HPI 64 year old Female seen for Welcome to Medicare exam and evaluation of medical issues.  She has a history of hyperlipidemia treated with Lipitor.  History of depression stable on SSRI.  History of GE reflux treated with PPI.  Had colonoscopy in 2016.  History of irritable bowel symptoms treated with Bentyl.  This started around 2015 and had extensive work-up including colonoscopy showing no colitis.  Dr. Huel Cote is GYN physician.  Dr. Ewing Schlein is her gastroenterologist.  No known drug allergies.  History of allergic rhinitis and fibrocystic breast disease.  Had right trigger finger release by Dr. Mina Marble in the remote past.  She has a dog bite to her right hand during the summer 2015.  Rabies immunization had expired on the dog at the time.  We checked with an infectious disease consultant and it was felt that rabies immunization for the patient was not necessary as the expiration date on the dog's rabies vaccine was only a couple of months before the bite.  History of osteoarthritis left knee injected at [facility].  This was done in 2020.  Social history: She formerly worked as Financial controller for [employer].  She is retired.  She has a college degree and is married to a retired Runner, broadcasting/film/video.  Does not smoke or consume alcohol.  She has grandchildren.  She tries to walk several miles weekly.  2 adult children, son and a daughter.  Family history: Mother with history of heart disease, lung cancer as well as colon cancer.  1 brother with history of coronary artery disease.  History of left carotid bruit and ultrasound 2018 showed 1 to 39 percent stenosis.  Patient has impaired glucose tolerance with hemoglobin A1c 6.2 percent.  She was started on Metformin 500 mg daily and will need follow-up in November.  Takes Protonix for GE reflux.  Despite being on statin  medication triglycerides are elevated at 204 and LDL cholesterol is 117.  Needs to work on diet and exercise regimen.  Fasting glucose is 138.  TSH is normal.  CBC is normal.    Review of Systems Dr. Despina Hick injects both knees about once a year for arthritis management     Objective:   Physical Exam  Blood pressure 110/70 pulse 86 pulse oximetry 97 percent weight 188 pounds BMI 33.30  Skin is warm and dry.  Nodes none.  TMs are clear.  Neck is supple.  Left carotid bruit noted.  Breast without masses.  Cardiac exam regular rate and rhythm normal S1 and S2 without murmurs or gallops.  Abdomen soft nondistended without hepatosplenomegaly masses or tenderness.  Pelvic exam deferred to GYN.  No lower extremity edema.  Neuro intact without focal deficits.      Assessment & Plan:  She is being started on Metformin for impaired glucose tolerance and will follow up in November.  Needs to work on diet and exercise.  Osteoarthritis requiring knee injections by Dr. Despina Hick about once a year  Hyperlipidemia treated with statin but her triglycerides are elevated at 204 and LDL is elevated at 117.  Denies noncompliance with medication.  Needs to watch diet and will need to follow-up with as in November as well.  Health maintenance she has had 2 Pfizer vaccines in March.  Tetanus immunization is up-to-date.  She needs to check with [facility] to see when repeat colonoscopy  is due.  Last done 2016.  History of depression stable with SSRI  History of irritable bowel symptoms treated with Levsin  History of GE reflux treated with PPI  History of left carotid bruit with 1 to 39 percent stenosis noted in 2018.  Plan: Has follow-up appointment in November regarding impaired glucose tolerance.  Watch diet.  Get more exercise.  Continue statin medication.  Continue PPI and SSRI.  Subjective:   Patient presents for Medicare Annual/Subsequent preventive examination.  Review Past Medical/Family/Social: See  above   Risk Factors  Current exercise habits: Tries to walk Dietary issues discussed: Low-fat low carbohydrate emphasized  Cardiac risk factors: Hyperlipidemia, impaired glucose tolerance  Depression Screen  (Note: if answer to either of the following is "Yes", a more complete depression screening is indicated)   Over the past two weeks, have you felt down, depressed or hopeless? No  Over the past two weeks, have you felt little interest or pleasure in doing things? No Have you lost interest or pleasure in daily life? No Do you often feel hopeless? No Do you cry easily over simple problems? No   Activities of Daily Living  In your present state of health, do you have any difficulty performing the following activities?:   Driving? No  Managing money? No  Feeding yourself? No  Getting from bed to chair? No  Climbing a flight of stairs? No  Preparing food and eating?: No  Bathing or showering? No  Getting dressed: No  Getting to the toilet? No  Using the toilet:No  Moving around from place to place: No  In the past year have you fallen or had a near fall?:No  Are you sexually active? No  Do you have more than one partner? No   Hearing Difficulties: No  Do you often ask people to speak up or repeat themselves?  Yes Do you experience ringing or noises in your ears? No  Do you have difficulty understanding soft or whispered voices?  Yes Do you feel that you have a problem with memory?  Sometimes Do you often misplace items?  Sometimes   Home Safety:  Do you have a smoke alarm at your residence? Yes Do you have grab bars in the bathroom?  No Do you have throw rugs in your house?  No  Cognitive Testing  Alert? Yes Normal Appearance?Yes  Oriented to person? Yes Place? Yes  Time? Yes  Recall of three objects? Yes  Can perform simple calculations? Yes  Displays appropriate judgment?Yes  Can read the correct time from a watch face?Yes   List the Names of Other  Physician/Practitioners you currently use:  See referral list for the physicians patient is currently seeing.     Review of Systems: See above  Objective:     General appearance: Appears younger than stated age and mildly obese  Head: Normocephalic, without obvious abnormality, atraumatic  Eyes: conj clear, EOMi PEERLA  Ears: normal TM's and external ear canals both ears  Nose: Nares normal. Septum midline. Mucosa normal. No drainage or sinus tenderness.  Throat: lips, mucosa, and tongue normal; teeth and gums normal  Neck: no adenopathy, no carotid bruit, no JVD, supple, symmetrical, trachea midline and thyroid not enlarged, symmetric, no tenderness/mass/nodules  No CVA tenderness.  Lungs: clear to auscultation bilaterally  Breasts: normal appearance, no masses or tenderness Heart: regular rate and rhythm, S1, S2 normal, no murmur, click, rub or gallop  Abdomen: soft, non-tender; bowel sounds normal; no masses, no organomegaly  Musculoskeletal: ROM normal in all joints, no crepitus, no deformity, Normal muscle strengthen. Back  is symmetric, no curvature. Skin: Skin color, texture, turgor normal. No rashes or lesions  Lymph nodes: Cervical, supraclavicular, and axillary nodes normal.  Neurologic: CN 2 -12 Normal, Normal symmetric reflexes. Normal coordination and gait  Psych: Alert & Oriented x 3, Mood appear stable.    Assessment:    Annual wellness medicare exam   Plan:    During the course of the visit the patient was educated and counseled about appropriate screening and preventive services including:   Recommend annual flu vaccine  Has had 2 COVID-19 vaccines  Needs pneumococcal 23 upon return  She needs to check with [facility] to see when repeat colonoscopy is due      Patient Instructions (the written plan) was given to the patient.  Medicare Attestation  I have personally reviewed:  The patient's medical and social history  Their use of alcohol, tobacco or  illicit drugs  Their current medications and supplements  The patient's functional ability including ADLs,fall risks, home safety risks, cognitive, and hearing and visual impairment  Diet and physical activities  Evidence for depression or mood disorders  The patient's weight, height, BMI, and visual acuity have been recorded in the chart. I have made referrals, counseling, and provided education to the patient based on review of the above and I have provided the patient with a written personalized care plan for preventive services.

## 2020-10-11 ENCOUNTER — Other Ambulatory Visit: Payer: Self-pay | Admitting: Internal Medicine

## 2020-10-15 ENCOUNTER — Encounter: Payer: Self-pay | Admitting: Internal Medicine

## 2020-10-15 NOTE — Patient Instructions (Signed)
We will follow-up with several of these issues when returns on November 8.  She needs to start Metformin 500 mg daily and watch her diet.  Hemoglobin A1c will be repeated at around that time.  Needs to check with [facility] to see when colonoscopy is due.  Will be due for pneumococcal vaccine when she returns.

## 2020-10-18 ENCOUNTER — Ambulatory Visit: Payer: Self-pay

## 2020-10-20 ENCOUNTER — Ambulatory Visit
Admission: RE | Admit: 2020-10-20 | Discharge: 2020-10-20 | Disposition: A | Discharge status: Home / Self Care | Payer: Medicare Other | Source: Ambulatory Visit | Attending: Internal Medicine | Admitting: Internal Medicine

## 2020-10-20 ENCOUNTER — Other Ambulatory Visit: Payer: Self-pay

## 2020-10-27 ENCOUNTER — Other Ambulatory Visit: Payer: Self-pay

## 2020-10-27 ENCOUNTER — Other Ambulatory Visit: Payer: Medicare Other | Admitting: Internal Medicine

## 2020-10-27 DIAGNOSIS — R7302 Impaired glucose tolerance (oral): Secondary | ICD-10-CM

## 2020-10-28 LAB — HEMOGLOBIN A1C
Hgb A1c MFr Bld: 6.1 % of total Hgb — ABNORMAL HIGH (ref <5.7)
Mean Plasma Glucose: 128 (calc)
eAG (mmol/L): 7.1 (calc)

## 2020-10-31 ENCOUNTER — Other Ambulatory Visit: Payer: Self-pay

## 2020-10-31 ENCOUNTER — Ambulatory Visit (INDEPENDENT_AMBULATORY_CARE_PROVIDER_SITE_OTHER): Payer: Medicare Other | Admitting: Internal Medicine

## 2020-10-31 ENCOUNTER — Telehealth: Payer: Self-pay

## 2020-10-31 ENCOUNTER — Encounter: Payer: Self-pay | Admitting: Internal Medicine

## 2020-10-31 VITALS — BP 120/80 | HR 73 | Ht 63.0 in | Wt 186.0 lb

## 2020-10-31 DIAGNOSIS — Z23 Encounter for immunization: Secondary | ICD-10-CM

## 2020-10-31 DIAGNOSIS — R0989 Other specified symptoms and signs involving the circulatory and respiratory systems: Secondary | ICD-10-CM

## 2020-10-31 DIAGNOSIS — E7439 Other disorders of intestinal carbohydrate absorption: Secondary | ICD-10-CM

## 2020-10-31 DIAGNOSIS — E782 Mixed hyperlipidemia: Secondary | ICD-10-CM

## 2020-10-31 DIAGNOSIS — R791 Abnormal coagulation profile: Secondary | ICD-10-CM

## 2020-10-31 DIAGNOSIS — Z8659 Personal history of other mental and behavioral disorders: Secondary | ICD-10-CM

## 2020-10-31 MED ORDER — METFORMIN HCL 500 MG PO TABS
500.0000 mg | ORAL_TABLET | Freq: Two times a day (BID) | ORAL | 1 refills | Status: DC
Start: 2020-10-31 — End: 2020-11-27

## 2020-10-31 NOTE — Progress Notes (Signed)
   Subjective:    Patient ID: Donna Spears, female    DOB: [birth year], 64 y.o.   MRN: [id number]  HPI 64 year old Female seen today for weight check and to receive pneumococcal 23 vaccine. She had Welcome to [facility] physical exam September 26, 2020. She was started on Metformin for impaired glucose tolerance and is here for follow-up of elevated hemoglobin A1c. It was 6.2% in September and is now 6.1%. She will continue to work on diet and exercise.  Today, she has some additional questions   She has a history of left carotid bruit. Carotid Dopplers ordered on November 8. Last study was done in 2018 indicating a 1 to 39% stenosis in both right and left carotids. She is on Lipitor 40 mg daily.  History of impaired glucose tolerance. History of GE reflux, depression, allergic rhinitis, musculoskeletal pain.  Dr. Ewing Schlein is her Gastroenterologist.  No known drug allergies.  In September, triglycerides were elevated at 204 and previously were 138. These were not repeated prior to this visit. We can repeat at 6 months recheck in April 2022. Currently on Lipitor 40 mg daily.  Her weight was 188 lbs in October and is now 186 lbs. She will continue to work on diet and exercise.  Review of Systems says that she had prolonged bleeding recently when she had dental work. Explained that taking aspirin Aleve or Motrin can increase bleeding times. She did request evaluation and we checked pro time INR which was normal as well as PTT which was also normal.     Objective:   Physical Exam Blood pressure 120/80 pulse 73 regular pulse oximetry 97% weight 186 lbs BMI 32.95 height 5 ft 3 in  No evidence of significant bruising today despite recent prolonged bleeding after dental procedure.  Visit was mainly discussion about medical issues described above.       Assessment & Plan:  Episode of prolonged bleeding after dental procedure. PT and PTT checked and proved to be normal  Hyperlipidemia-continue  statin  Impaired glucose tolerance-continue diet and exercise efforts and Metformin. Follow-up in April 2022.  Left carotid bruit-repeat carotid studies ordered  History of depression treated with Effexor  GE reflux treated with PPI  Plan: Follow-up April 2022.

## 2020-10-31 NOTE — Telephone Encounter (Signed)
Please call her back. Her Hgb AIC is 6.1 %. Normal is 5.7% or less. Let's increase Metformin to 500 mg twice a day.  Please send in new Rx. Is she willing to see dietician? I am sorry we got side tracked with her other complaints today.

## 2020-10-31 NOTE — Telephone Encounter (Signed)
Patient called states her glucose was not discussed during her office visit and she is requesting a call back. Please advise.

## 2020-10-31 NOTE — Telephone Encounter (Signed)
Patient states she never took metformin bc she wanted to work on he diet and bring her A1C down without a medication.  Per Dr. Lenord Fellers she needs to take metformin BID and return for an A1C in 6 weeks no OV.  Patient agreed.

## 2020-11-01 LAB — APTT: aPTT: 27 s (ref 23–32)

## 2020-11-01 LAB — PROTIME-INR
INR: 1
Prothrombin Time: 10.6 s (ref 9.0–11.5)

## 2020-11-04 IMAGING — MG DIGITAL SCREENING BILAT W/ CAD
5 series · 5 of 5 positions shown · non-contrast
Comparison: Previous exam(s).

CLINICAL DATA: Screening.

EXAM:
DIGITAL SCREENING BILATERAL MAMMOGRAM WITH CAD

[L CC]
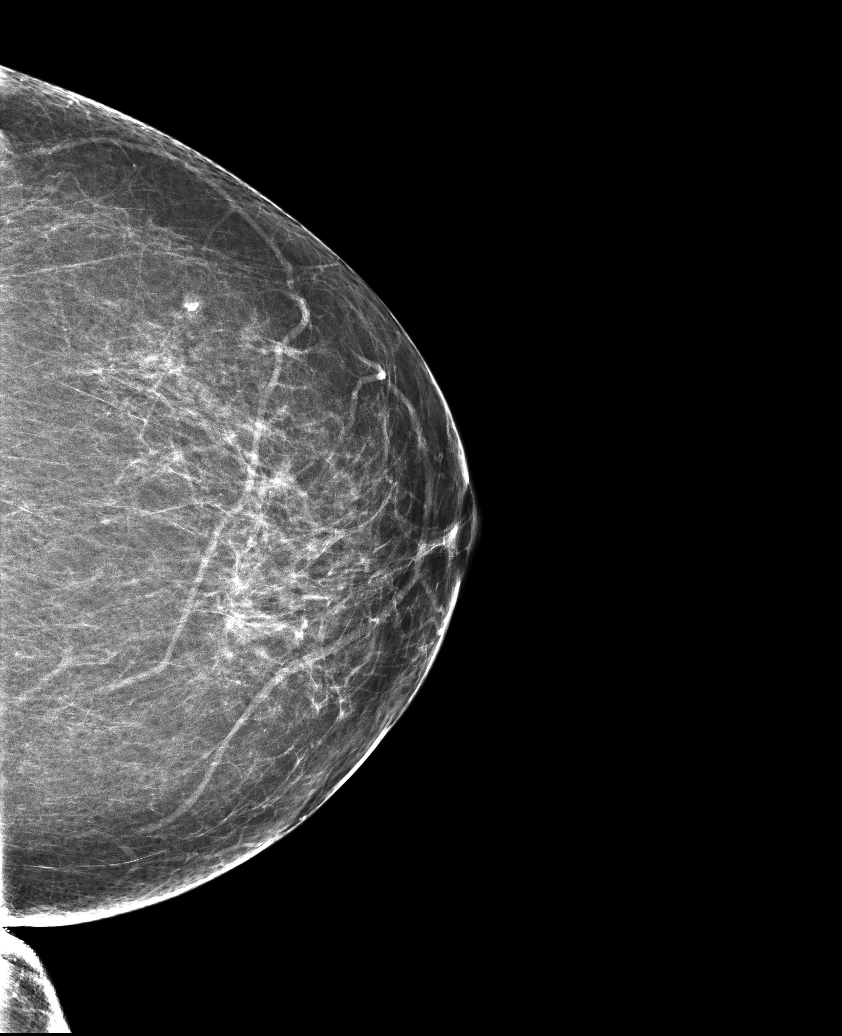

[L MLO (1 of 2)]
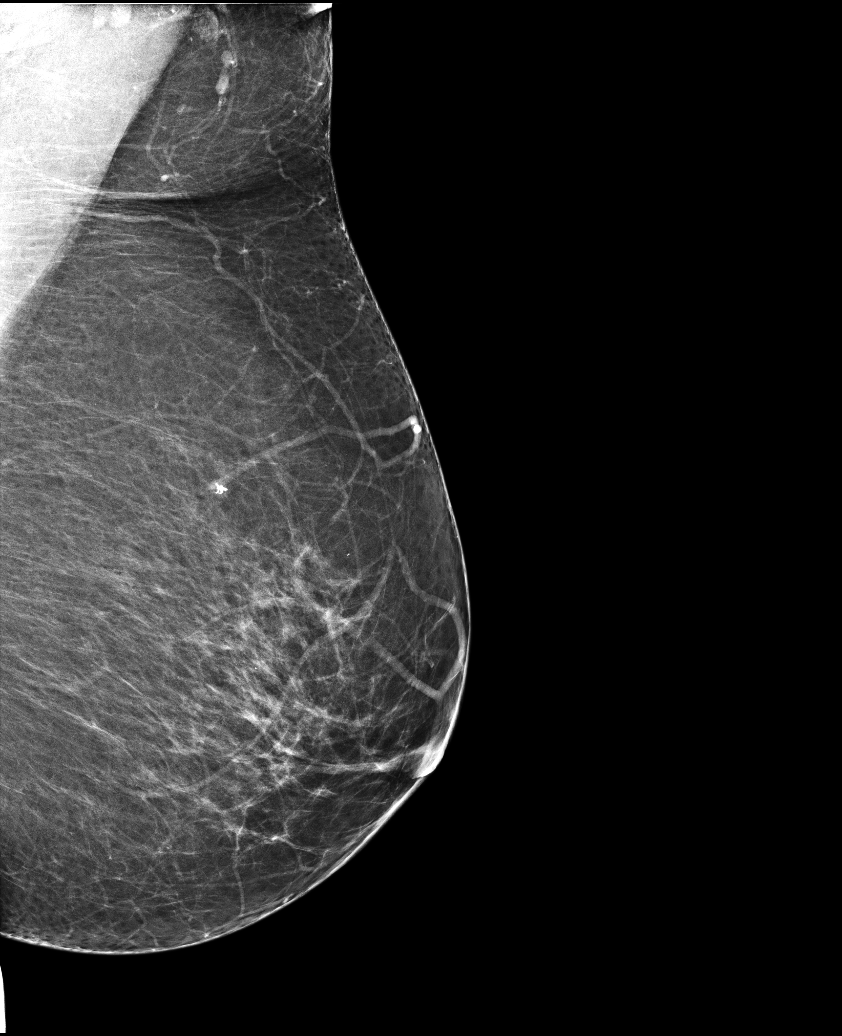

[R MLO]
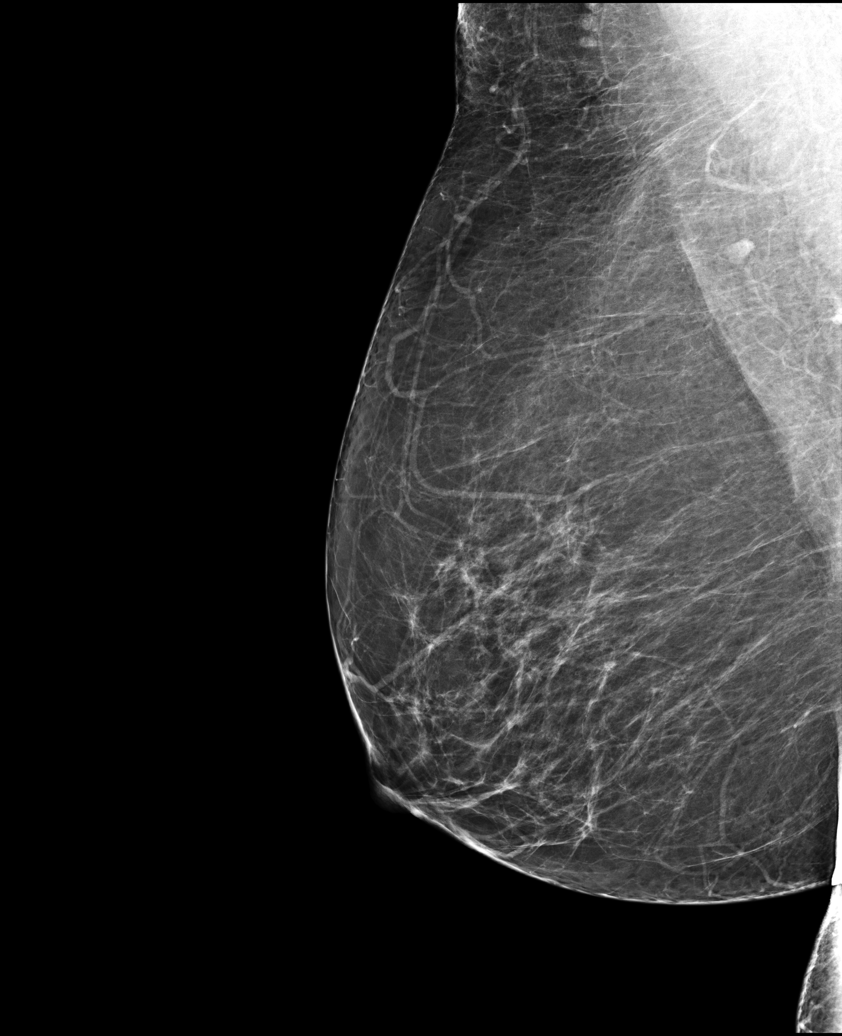

[R CC]
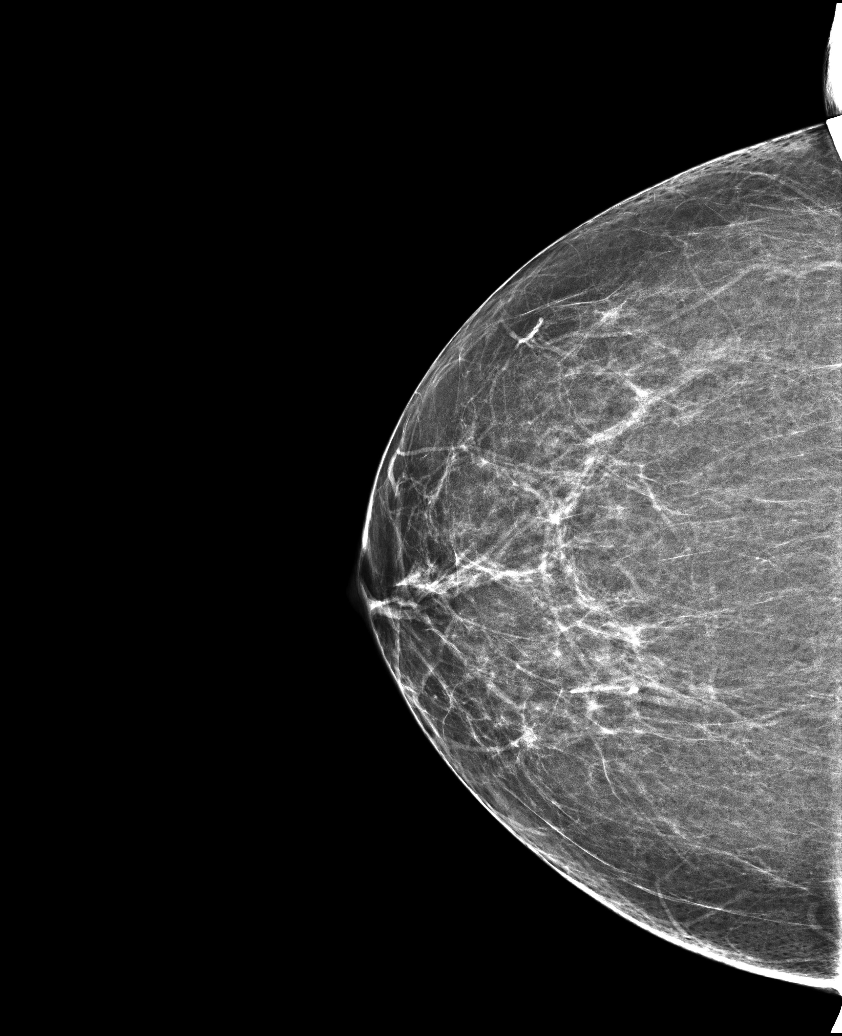

[L MLO (2 of 2)]
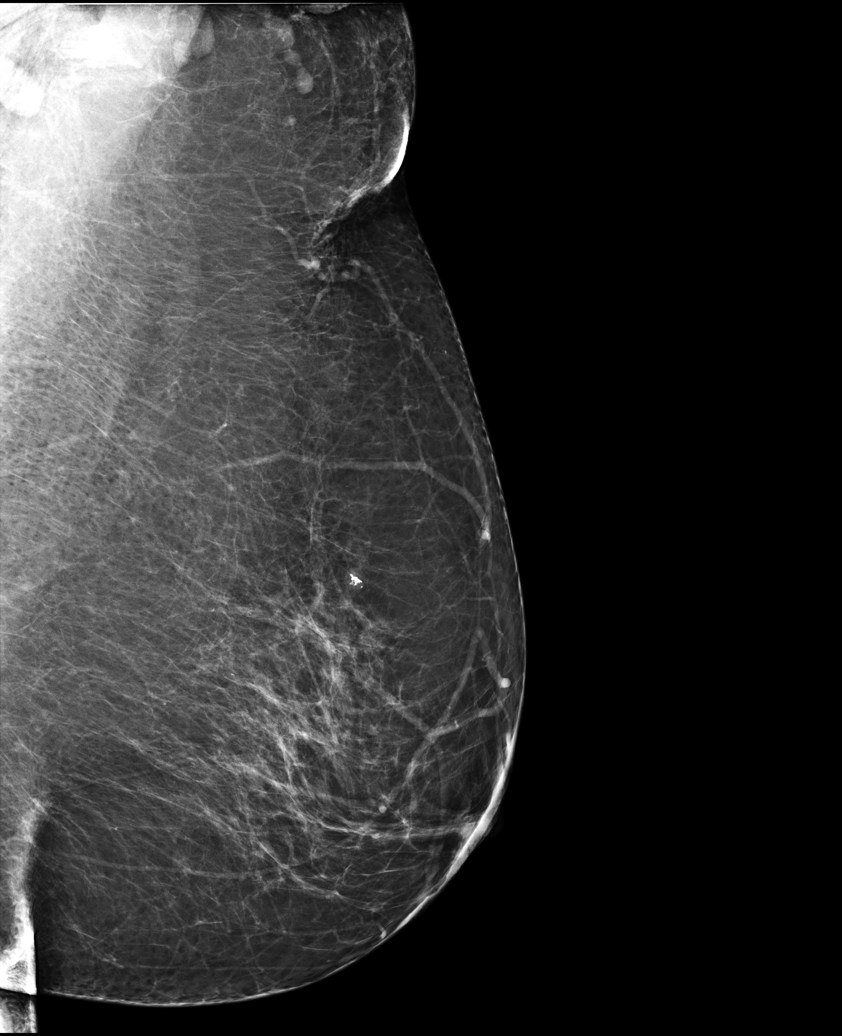

[5 of 5 positions shown; findings below may reference images not displayed]

ACR Breast Density Category b: There are scattered areas of
fibroglandular density.
FINDINGS: There are no findings suspicious for malignancy. Images were
processed with CAD.
IMPRESSION: No mammographic evidence of malignancy. A result letter of this
screening mammogram will be mailed directly to the patient.

RECOMMENDATION:
Screening mammogram in one year. (Code:AS-G-LCT)

BI-RADS CATEGORY  1: Negative.

## 2020-11-15 ENCOUNTER — Other Ambulatory Visit: Payer: Self-pay | Admitting: Internal Medicine

## 2020-11-27 ENCOUNTER — Other Ambulatory Visit: Payer: Self-pay | Admitting: Internal Medicine

## 2020-12-21 NOTE — Patient Instructions (Addendum)
Recommend Doppler studies of the carotids. Continue to watch diet and try to get more exercise. Hemoglobin A1c minimally improved. Please have Covid booster. Return in 6 months.

## 2021-03-31 ENCOUNTER — Other Ambulatory Visit: Payer: Self-pay

## 2021-03-31 ENCOUNTER — Other Ambulatory Visit: Payer: Medicare Other | Admitting: Internal Medicine

## 2021-03-31 DIAGNOSIS — E782 Mixed hyperlipidemia: Secondary | ICD-10-CM

## 2021-03-31 DIAGNOSIS — E7439 Other disorders of intestinal carbohydrate absorption: Secondary | ICD-10-CM

## 2021-04-01 LAB — HEPATIC FUNCTION PANEL
AG Ratio: 1.7 (calc) (ref 1.0–2.5)
ALT: 27 U/L (ref 6–29)
AST: 21 U/L (ref 10–35)
Albumin: 4.1 g/dL (ref 3.6–5.1)
Alkaline phosphatase (APISO): 95 U/L (ref 37–153)
Bilirubin, Direct: 0.1 mg/dL (ref 0.0–0.2)
Globulin: 2.4 g/dL (calc) (ref 1.9–3.7)
Indirect Bilirubin: 0.2 mg/dL (calc) (ref 0.2–1.2)
Total Bilirubin: 0.3 mg/dL (ref 0.2–1.2)
Total Protein: 6.5 g/dL (ref 6.1–8.1)

## 2021-04-01 LAB — HEMOGLOBIN A1C
Hgb A1c MFr Bld: 6 % of total Hgb — ABNORMAL HIGH (ref <5.7)
Mean Plasma Glucose: 126 mg/dL
eAG (mmol/L): 7 mmol/L

## 2021-04-01 LAB — LIPID PANEL
Cholesterol: 179 mg/dL (ref <200)
HDL: 52 mg/dL (ref >=50)
LDL Cholesterol (Calc): 100 mg/dL (calc) — ABNORMAL HIGH
Non-HDL Cholesterol (Calc): 127 mg/dL (calc) (ref <130)
Total CHOL/HDL Ratio: 3.4 (calc) (ref <5.0)
Triglycerides: 170 mg/dL — ABNORMAL HIGH (ref <150)

## 2021-04-03 ENCOUNTER — Other Ambulatory Visit: Payer: Self-pay

## 2021-04-03 ENCOUNTER — Ambulatory Visit (INDEPENDENT_AMBULATORY_CARE_PROVIDER_SITE_OTHER): Payer: Medicare Other | Admitting: Internal Medicine

## 2021-04-03 ENCOUNTER — Encounter: Payer: Self-pay | Admitting: Internal Medicine

## 2021-04-03 VITALS — BP 130/70 | HR 72 | Ht 63.0 in | Wt 184.0 lb

## 2021-04-03 DIAGNOSIS — Z8659 Personal history of other mental and behavioral disorders: Secondary | ICD-10-CM

## 2021-04-03 DIAGNOSIS — K219 Gastro-esophageal reflux disease without esophagitis: Secondary | ICD-10-CM

## 2021-04-03 DIAGNOSIS — E782 Mixed hyperlipidemia: Secondary | ICD-10-CM

## 2021-04-03 DIAGNOSIS — E7439 Other disorders of intestinal carbohydrate absorption: Secondary | ICD-10-CM | POA: Diagnosis not present

## 2021-04-03 DIAGNOSIS — R0989 Other specified symptoms and signs involving the circulatory and respiratory systems: Secondary | ICD-10-CM | POA: Diagnosis not present

## 2021-04-03 NOTE — Progress Notes (Signed)
   Subjective:    Patient ID: Donna Spears, female    DOB: 1956-08-15, 65 y.o.   MRN: [id number]  HPI  65 year old Female seen for 6 month recheck appt. she was seen in October 2021 for Welcome to Medicare physical exam.  She has a history of hyperlipidemia treated with Lipitor.  History of depression treated with SSRI and history of GE reflux treated with PPI.  History of allergic rhinitis and fibrocystic breast disease.  On exam in October noted left carotid bruit.  Left carotid bruit is still present today so I am going to order carotid duplex study.  In 2018 she had left carotid bruit with 1 to 39% stenosis.  She has impaired glucose tolerance treated with diet and metformin.  Hemoglobin A1c is 6% and was 6.1% in November 2021.  She is on Lipitor 40 mg daily.  Triglycerides are 170 and LDL cholesterol 100.  Total cholesterol 179.  In September 2021.  Triglycerides were 204 and LDL cholesterol was 117.    Review of Systems see above-no new complaints     Objective:   Physical Exam  Blood pressure 130/70 pulse 72 pulse oximetry 97% weight 184 pounds height 5 feet 3 inches BMI 32.59  Skin warm and dry.  No cervical adenopathy.  No thyromegaly.  Has left carotid bruit to my exam.  Chest is clear to auscultation without rales or wheezing.  Cardiac exam: Regular rate and rhythm without murmurs or gallops.  No lower extremity pitting edema.  Affect thought and judgment are normal.      Assessment & Plan:  Hyperlipidemia-continue atorvastatin (Lipitor)  40 mg daily.  Impaired glucose tolerance-continue metformin 500 mg daily with meal  GE reflux treated with Protonix  Anxiety and stress treated with Effexor  Left carotid bruit-to order carotid duplex study.  In 2018 there was a 1 to 39% stenosis.  Plan: Medicare wellness and health maintenance exam due in October.  Appointment has been made.  Had colonoscopy in 2016.

## 2021-04-03 NOTE — Patient Instructions (Addendum)
Patient has risk factors for coronary and vascular disease. Continue statin medication. Lipid panel is stable. Hx left carotid bruit. Have ordered carotid duplex study at [facility]. RTC in 6 months. Continue same meds. Work on diet and exercise.

## 2021-04-05 ENCOUNTER — Ambulatory Visit
Admission: RE | Admit: 2021-04-05 | Discharge: 2021-04-05 | Disposition: A | Discharge status: Home / Self Care | Payer: Medicare Other | Source: Ambulatory Visit | Attending: Internal Medicine | Admitting: Internal Medicine

## 2021-06-13 ENCOUNTER — Other Ambulatory Visit: Payer: Self-pay | Admitting: Internal Medicine

## 2021-07-05 ENCOUNTER — Telehealth: Payer: Self-pay | Admitting: Internal Medicine

## 2021-07-05 NOTE — Telephone Encounter (Signed)
Ai Sonnenfeld [phone]  Sandy called to say she started having some right leg pain on the outside last night, she took some ibuprofen and put some CBD ointment and was able to sleep but is having trouble getting comfortable sitting this morning. She mentioned that she had been getting injections in her knees. I let her know you were out of office today and since it was her legs to try and call her ortho doctor and see if they could see her because they could do xray in office. She will call back if she needs too.

## 2021-07-28 ENCOUNTER — Other Ambulatory Visit: Payer: Self-pay | Admitting: Internal Medicine

## 2021-07-31 ENCOUNTER — Telehealth: Payer: Self-pay | Admitting: Internal Medicine

## 2021-07-31 NOTE — Telephone Encounter (Signed)
Scheduled

## 2021-07-31 NOTE — Telephone Encounter (Signed)
LVM to CB and schedule appointment 

## 2021-07-31 NOTE — Telephone Encounter (Signed)
Donna Spears [phone]  Andrey called to say for the last couple of weeks she has been having a throbbing pain in her right leg, no matter what she is doing.

## 2021-08-04 ENCOUNTER — Other Ambulatory Visit: Payer: Self-pay

## 2021-08-04 ENCOUNTER — Ambulatory Visit (INDEPENDENT_AMBULATORY_CARE_PROVIDER_SITE_OTHER): Payer: Medicare Other | Admitting: Internal Medicine

## 2021-08-04 ENCOUNTER — Ambulatory Visit (HOSPITAL_COMMUNITY)
Admission: RE | Admit: 2021-08-04 | Discharge: 2021-08-04 | Disposition: A | Discharge status: Home / Self Care | Payer: Medicare Other | Source: Ambulatory Visit | Attending: Internal Medicine | Admitting: Internal Medicine

## 2021-08-04 VITALS — BP 130/90 | HR 74 | Ht 63.0 in | Wt 184.0 lb

## 2021-08-04 DIAGNOSIS — M79604 Pain in right leg: Secondary | ICD-10-CM

## 2021-08-04 MED ORDER — MELOXICAM 15 MG PO TABS: 15.0000 mg | ORAL_TABLET | Freq: Every day | ORAL | 0 refills | Status: DC

## 2021-08-04 MED ORDER — CYCLOBENZAPRINE HCL 10 MG PO TABS: ORAL_TABLET | ORAL | 0 refills | Status: DC

## 2021-08-04 NOTE — Progress Notes (Deleted)
Right lower extremity doppler

## 2021-08-04 NOTE — Progress Notes (Signed)
Pt was notified of results and instructions, pt verbalized understanding.   

## 2021-08-13 ENCOUNTER — Encounter: Payer: Self-pay | Admitting: Internal Medicine

## 2021-08-13 NOTE — Progress Notes (Signed)
   Subjective:    Patient ID: Donna Spears, female    DOB: [birth year], 65 y.o.   MRN: [id number]  HPI 65 year old Female with history of hyperlipidemia, non-smoker with right leg pain lateral aspect.  She took some ibuprofen and used some CBD topical preparation.  Was able to sleep but is having trouble today getting comfortable tickly in the sitting position.  She has been doing a lot of yard work.  Has been seen by orthopedist regarding knee pain and has had some injections.  In October 2021 she told me she had been to [facility] for left knee injection  in 2020.  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  Husband is retired Runner, broadcasting/film/video but is disabled.  Used to walk several miles weekly but lately has not been able to do that due to pain in her right leg.  Symptoms started mid July.  Describes pain in leg as throbbing.  No significant travel history.  No history of DVT.  No known injury to her leg other than doing heavy yard work.  Had Welcome to Medicare exam in October 2021.  History of hyperlipidemia treated with statin medication.  History of GE reflux treated with PPI.  History of depression treated with SSRI.  No known drug allergies.  History of allergic rhinitis and fibrocystic breast disease.  Review of Systems see above     Objective:   Physical Exam  Denna Haggard' sign is negative in the right lower extremity.  Pulses are normal both dorsalis pedis and posterior tibial.  Foot is warm and leg is warm.  No pain in the calf to deep palpation.  No redness noted.      Assessment & Plan:  Right leg pain-etiology unclear.  She does have history of hyperlipidemia but is on statin medication.  Need to rule out DVT.  She will have Doppler study as soon as possible.  Was given Mobic 15 mg daily for inflammation and Flexeril 10 mg tablet 1/2-1 at bedtime so she can get some sleep because the right leg pain has been quite uncomfortable.  If she has  persistent discomfort despite treatment and negative Doppler study, will need to be referred to orthopedist.  Do not think this is a vascular issue.  Does not have a history of smoking but does have history of hyperlipidemia.  Could consider physical therapy.  Advised cutting back on heavy yard work if possible

## 2021-08-13 NOTE — Patient Instructions (Addendum)
To have Doppler of the right lower extremity due to severe right leg pain.  Take Flexeril 1/2 to 1 tablet at bedtime in order to sleep.  Take Mobic 15 mg daily with a meal pain and inflammation.  Addendum: Doppler study of right lower extremity is negative for DVT.  If pain persist consider physical therapy or vascular surgery evaluation.  Cut back on heavy yard work

## 2021-09-06 ENCOUNTER — Other Ambulatory Visit: Payer: Self-pay | Admitting: Internal Medicine

## 2021-09-14 ENCOUNTER — Other Ambulatory Visit: Payer: Self-pay | Admitting: Internal Medicine

## 2021-09-15 ENCOUNTER — Encounter: Payer: Self-pay | Admitting: Internal Medicine

## 2021-09-15 ENCOUNTER — Ambulatory Visit (INDEPENDENT_AMBULATORY_CARE_PROVIDER_SITE_OTHER): Payer: Medicare Other | Admitting: Internal Medicine

## 2021-09-15 ENCOUNTER — Other Ambulatory Visit: Payer: Self-pay

## 2021-09-15 ENCOUNTER — Telehealth: Payer: Self-pay | Admitting: Internal Medicine

## 2021-09-15 VITALS — BP 158/86 | HR 84 | Temp 97.8°F | Resp 16 | Ht 63.0 in | Wt 183.0 lb

## 2021-09-15 DIAGNOSIS — M5416 Radiculopathy, lumbar region: Secondary | ICD-10-CM

## 2021-09-15 MED ORDER — OXYCODONE-ACETAMINOPHEN 5-325 MG PO TABS: 1.0000 | ORAL_TABLET | Freq: Three times a day (TID) | ORAL | 0 refills | Status: AC | PRN

## 2021-09-15 MED ORDER — ALPRAZOLAM 0.5 MG PO TABS: 0.5000 mg | ORAL_TABLET | Freq: Every day | ORAL | 1 refills | Status: DC

## 2021-09-15 MED ORDER — METHYLPREDNISOLONE 4 MG PO TABS: ORAL_TABLET | ORAL | 0 refills | Status: DC

## 2021-09-15 NOTE — Progress Notes (Signed)
   Subjective:    Patient ID: Donna Spears, female    DOB: [birth year], 65 y.o.   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 Went to lie down a couple of times as right leg was hurting. Yesterday had right leg pain all day. Started in upper posterior thigh. Had severe right buttock and right lower leg pain last night and continuing today.  She called initially saying she was having right leg pain in mid July.  Was having trouble sleeping.  She has been getting some injections in her knees and [facility].  I was out of the office.  She called back on August 18 she has had throbbing pain in her right leg for a couple of weeks.  I saw her on August 12 for that issue.  Her pulses were intact in her right lower leg.  She had no calf pain.  She had a Doppler study that was negative.  She was given Mobic 15 mg daily and Flexeril 10 mg at bedtime.  Advised patient to consider physical therapy if not improving.  She has a history of hyperlipidemia, impaired glucose tolerance, GE reflux.  Unfortunately, her husband is now confined to a wheelchair.  He had a severe accident when he was a Dance movement psychotherapist and has a lot of spine pain according to the patient.  Patient is having to do a lot of yard work and all of the housework.  Due to her right leg pain, she has not been getting a lot of swelling.  She was brought to the office today by her daughter.  Daughter is here visiting but on her way to the beach.  Review of Systems denies numbness in the right lower extremity or significant weakness but having severe pain in right buttock and also right lower leg.       Objective:   Physical Exam Blood pressure is 158/86 but she is in severe pain today pulse 84 regular respiratory rate 16 temperature 97.8 degrees  Patient is in severe pain and is lying on the exam table.  Does not want to move from supine position.  Straight leg raising is positive at 90  degrees on the right.  Her muscle strength is 4/5 in the right lower extremity. Reflex is present in knee but absent in ankle.     Assessment & Plan:  Severe right lumbar radiculopathy  Plan: Have scheduled MRI of the LS spine.  Earliest appointment that I could obtain 4:50 PM at [facility] imaging [address] phone # [phone]  They tell me patient is welcome to call back and see if there is an earlier appointment/cancellation.  Patient was given oxycodone 5 mg #15 to take sparingly every 8 hours if needed for severe right lumbar radiculopathy.  She was given Xanax 0.5 mg to take at bedtime for sleep.  She did not really get any sleep at all last night due to pain.  She is also being placed on a 12-day Medrol 4 mg Dosepak at this time.  Tells me she would like to have consultation with Dr. Thurston Hole at [facility].  My staff will call for an appointment.

## 2021-09-15 NOTE — Patient Instructions (Signed)
Take Oxycodone sparingly every 8-12 hours for pain. Take 12 day Medrol dosepack as directed for inflammation. May take Xanax to sleep. MRI of LS spine ordered. Patient would like consultation with Dr. Thurston Hole.

## 2021-09-15 NOTE — Telephone Encounter (Signed)
Donna Spears [phone]  [name] called to say she is expericing  Heavy R Leg pain so severe since Wednesday night she is unable to even stand. She has been taking the Meloxican and cyclobenzaprine.

## 2021-09-15 NOTE — Telephone Encounter (Signed)
scheduled

## 2021-09-25 ENCOUNTER — Other Ambulatory Visit: Payer: Self-pay

## 2021-09-25 ENCOUNTER — Other Ambulatory Visit: Payer: Medicare Other | Admitting: Internal Medicine

## 2021-09-25 DIAGNOSIS — E7439 Other disorders of intestinal carbohydrate absorption: Secondary | ICD-10-CM

## 2021-09-25 DIAGNOSIS — Z0000 Encounter for general adult medical examination without abnormal findings: Secondary | ICD-10-CM

## 2021-09-25 DIAGNOSIS — E782 Mixed hyperlipidemia: Secondary | ICD-10-CM

## 2021-09-25 DIAGNOSIS — Z1329 Encounter for screening for other suspected endocrine disorder: Secondary | ICD-10-CM

## 2021-09-25 DIAGNOSIS — Z130 Encounter for screening for diseases of the blood and blood-forming organs and certain disorders involving the immune mechanism: Secondary | ICD-10-CM

## 2021-09-26 LAB — CBC WITH DIFFERENTIAL/PLATELET
Absolute Monocytes: 620 cells/uL (ref 200–950)
Basophils Absolute: 60 cells/uL (ref 0–200)
Basophils Relative: 1.2 %
Eosinophils Absolute: 60 cells/uL (ref 15–500)
Eosinophils Relative: 1.2 %
HCT: 41.8 % (ref 35.0–45.0)
Hemoglobin: 14.4 g/dL (ref 11.7–15.5)
Lymphs Abs: 1465 cells/uL (ref 850–3900)
MCH: 30.8 pg (ref 27.0–33.0)
MCHC: 34.4 g/dL (ref 32.0–36.0)
MCV: 89.5 fL (ref 80.0–100.0)
MPV: 11.1 fL (ref 7.5–12.5)
Monocytes Relative: 12.4 %
Neutro Abs: 2795 cells/uL (ref 1500–7800)
Neutrophils Relative %: 55.9 %
Platelets: 224 10*3/uL (ref 140–400)
RBC: 4.67 10*6/uL (ref 3.80–5.10)
RDW: 12.5 % (ref 11.0–15.0)
Total Lymphocyte: 29.3 %
WBC: 5 10*3/uL (ref 3.8–10.8)

## 2021-09-26 LAB — LIPID PANEL
Cholesterol: 185 mg/dL (ref <200)
HDL: 42 mg/dL — ABNORMAL LOW (ref >=50)
LDL Cholesterol (Calc): 115 mg/dL (calc) — ABNORMAL HIGH
Non-HDL Cholesterol (Calc): 143 mg/dL (calc) — ABNORMAL HIGH (ref <130)
Total CHOL/HDL Ratio: 4.4 (calc) (ref <5.0)
Triglycerides: 161 mg/dL — ABNORMAL HIGH (ref <150)

## 2021-09-26 LAB — COMPREHENSIVE METABOLIC PANEL
AG Ratio: 1.7 (calc) (ref 1.0–2.5)
ALT: 32 U/L — ABNORMAL HIGH (ref 6–29)
AST: 28 U/L (ref 10–35)
Albumin: 4 g/dL (ref 3.6–5.1)
Alkaline phosphatase (APISO): 97 U/L (ref 37–153)
BUN: 16 mg/dL (ref 7–25)
CO2: 27 mmol/L (ref 20–32)
Calcium: 9.5 mg/dL (ref 8.6–10.4)
Chloride: 108 mmol/L (ref 98–110)
Creat: 0.57 mg/dL (ref 0.50–1.05)
Globulin: 2.3 g/dL (calc) (ref 1.9–3.7)
Glucose, Bld: 115 mg/dL — ABNORMAL HIGH (ref 65–99)
Potassium: 4.7 mmol/L (ref 3.5–5.3)
Sodium: 144 mmol/L (ref 135–146)
Total Bilirubin: 0.4 mg/dL (ref 0.2–1.2)
Total Protein: 6.3 g/dL (ref 6.1–8.1)

## 2021-09-26 LAB — HEMOGLOBIN A1C
Hgb A1c MFr Bld: 5.9 % of total Hgb — ABNORMAL HIGH (ref <5.7)
Mean Plasma Glucose: 123 mg/dL
eAG (mmol/L): 6.8 mmol/L

## 2021-09-26 LAB — TSH: TSH: 2.43 mIU/L (ref 0.40–4.50)

## 2021-09-27 ENCOUNTER — Other Ambulatory Visit: Payer: Medicare Other

## 2021-09-28 ENCOUNTER — Encounter: Payer: Self-pay | Admitting: Internal Medicine

## 2021-09-28 ENCOUNTER — Other Ambulatory Visit: Payer: Self-pay

## 2021-09-28 ENCOUNTER — Ambulatory Visit (INDEPENDENT_AMBULATORY_CARE_PROVIDER_SITE_OTHER): Payer: Medicare Other | Admitting: Internal Medicine

## 2021-09-28 VITALS — BP 114/72 | HR 76 | Temp 98.2°F | Ht 63.0 in | Wt 182.0 lb

## 2021-09-28 DIAGNOSIS — Z8659 Personal history of other mental and behavioral disorders: Secondary | ICD-10-CM

## 2021-09-28 DIAGNOSIS — Z23 Encounter for immunization: Secondary | ICD-10-CM | POA: Diagnosis not present

## 2021-09-28 DIAGNOSIS — R0989 Other specified symptoms and signs involving the circulatory and respiratory systems: Secondary | ICD-10-CM | POA: Diagnosis not present

## 2021-09-28 DIAGNOSIS — M5416 Radiculopathy, lumbar region: Secondary | ICD-10-CM

## 2021-09-28 DIAGNOSIS — E7439 Other disorders of intestinal carbohydrate absorption: Secondary | ICD-10-CM | POA: Diagnosis not present

## 2021-09-28 DIAGNOSIS — M5440 Lumbago with sciatica, unspecified side: Secondary | ICD-10-CM | POA: Diagnosis not present

## 2021-09-28 DIAGNOSIS — G8929 Other chronic pain: Secondary | ICD-10-CM

## 2021-09-28 DIAGNOSIS — K219 Gastro-esophageal reflux disease without esophagitis: Secondary | ICD-10-CM

## 2021-09-28 DIAGNOSIS — E782 Mixed hyperlipidemia: Secondary | ICD-10-CM

## 2021-09-28 DIAGNOSIS — Z0000 Encounter for general adult medical examination without abnormal findings: Secondary | ICD-10-CM

## 2021-09-28 LAB — POCT URINALYSIS DIPSTICK
Bilirubin, UA: NEGATIVE
Blood, UA: NEGATIVE
Glucose, UA: NEGATIVE
Ketones, UA: NEGATIVE
Leukocytes, UA: NEGATIVE
Nitrite, UA: NEGATIVE
Protein, UA: NEGATIVE
Spec Grav, UA: 1.01 (ref 1.010–1.025)
Urobilinogen, UA: 0.2 E.U./dL
pH, UA: 5 (ref 5.0–8.0)

## 2021-09-28 MED ORDER — CYCLOBENZAPRINE HCL 10 MG PO TABS: ORAL_TABLET | ORAL | 0 refills | Status: DC

## 2021-09-28 NOTE — Progress Notes (Signed)
Subjective:   Patient presents for Medicare Annual/Subsequent preventive examination.  Review Past Medical/Family/Social  no change   Risk Factors  Current exercise habits:  Back is hurt walks as much as body allows Dietary issues discussed: yes   Cardiac risk factors: HLD,   Depression Screen  (Note: if answer to either of the following is "Yes", a more complete depression screening is indicated)   Over the past two weeks, have you felt down, depressed or hopeless? No  Over the past two weeks, have you felt little interest or pleasure in doing things? No Have you lost interest or pleasure in daily life? No Do you often feel hopeless? No Do you cry easily over simple problems? No   Activities of Daily Living  In your present state of health, do you have any difficulty performing the following activities?:   Driving? No  Managing money? No  Feeding yourself? No  Getting from bed to chair? No  Climbing a flight of stairs? No  Preparing food and eating?: No  Bathing or showering? No  Getting dressed: No  Getting to the toilet? No  Using the toilet:No  Moving around from place to place: No  In the past year have you fallen or had a near fall?:No  Are you sexually active? No  Do you have more than one partner? No   Hearing Difficulties: No  Do you often ask people to speak up or repeat themselves? yes  Do you experience ringing or noises in your ears? No  Do you have difficulty understanding soft or whispered voices? yes Do you feel that you have a problem with memory? No Do you often misplace items? No    Home Safety:  Do you have a smoke alarm at your residence? Yes Do you have grab bars in the bathroom? yes Do you have throw rugs in your house? no   Cognitive Testing  Alert? Yes Normal Appearance?Yes  Oriented to person? Yes Place? Yes  Time? Yes  Recall of three objects? Yes  Can perform simple calculations? Yes  Displays appropriate judgment?Yes  Can read  the correct time from a watch face?Yes   List the Names of Other Physician/Practitioners you currently use:  See referral list for the physicians patient is currently seeing.   During the course of the visit the patient was educated and counseled about appropriate screening and preventive services including:   Patient Instructions (the written plan) was given to the patient.  Medicare Attestation  I have personally reviewed:  The patient's medical and social history  Their use of alcohol, tobacco or illicit drugs  Their current medications and supplements  The patient's functional ability including ADLs,fall risks, home safety risks, cognitive, and hearing and visual impairment  Diet and physical activities  Evidence for depression or mood disorders  The patient's weight, height, BMI, and visual acuity have been recorded in the chart. I have made referrals, counseling, and provided education to the patient based on review of the above and I have provided the patient with a written personalized care plan for preventive services.

## 2021-09-28 NOTE — Patient Instructions (Addendum)
Flu vaccine given.  Patient is to call Orthopedist regarding her recurrent low back pain.  She may request Neurology consultation regarding left thumb tremor after she has her back issue evaluated.  Continue current medications.  Is to have mammogram at [facility]. She needs to call for appt. She wants to continue seeing GYN for Pap smears.  Return in 1 year or as needed.  Watch diet and walk some for exercise which will help strengthen back muscles

## 2021-09-28 NOTE — Progress Notes (Signed)
Subjective:    Patient ID: Donna Spears, female    DOB: [birth year], 65 y.o.   MRN: [id number]  HPI 65 year old Female seen for Medicare wellness, health maintenance exam and evaluation of medical issues. Back pain for which she was seen in September has improved with Meloxicam. Does not want to take Meloxicam everyday but will when needs it.   Hx of impaired glucose tolerance treated with metformin,History of GE reflux, allergic rhinitis treated with Flonase, hyperlipidemia treated with Lipitor 40 mg daily.  History of depression treated with SSRI.    Reminded patient to call [facility] for appt. history of fibrocystic breast disease.  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  Husband is a retired Runner, broadcasting/film/video but is disabled.  Patient does not smoke or consume alcohol.  Dr. Huel Cote is GYN physician.  Dr. Ewing Schlein is her gastroenterologist.  No known drug allergies.  History of right trigger finger release by Dr. Mina Marble in the remote past.  She had a dog bite to her right hand during the Summer 2015.  Rabies immunization had expired on the dog at that time.  We will check with infectious disease consultant.  It was felt that rabies immunization for the patient was not necessary as the expiration date on the dog's vaccine was only a couple of months before the bite.  History of osteoarthritis left knee injected at [facility].  History of left carotid bruit but ultrasound in 2018 showed only 1 to 39% stenosis and she is asymptomatic.  History of impaired glucose tolerance.  Has been prescribed metformin.  Family history: Mother with history of heart disease, lung cancer and colon cancer.  1 brother with history of coronary artery disease.  History of GE reflux treated with Protonix.    Review of Systems no SOB or  chest pain. Has low back pain and tremor left thumb.     Objective:   Physical Exam Has  essential tremor left thumb.  No cervical adenopathy.  Left carotid bruit.  Chest clear to auscultation.  TMs are clear.  Cardiac exam: Regular rate and rhythm.  Abdomen soft nondistended without hepatosplenomegaly, masses, or tenderness.  Pelvic exam: Deferred to GYN.  No lower extremity edema.  Neurological exam is intact without gross focal deficits.  Affect, thought, judgment appear to be normal.       Assessment & Plan:   Impaired glucose tolerance-hemoglobin A1c stable at 5.9%.  Continue metformin.  Osteoarthritis of knees requiring injections from Dr. Lequita Halt around once a year.  Hyperlipidemia treated with statin medication.  Triglycerides are 161 and LDL cholesterol 115.  Has not been able to exercise much recently due to severe low back pain which is finally improved.  History of irritable bowel syndrome treated with Levsin  GE reflux treated with PPI  Left carotid bruit with 1 to 39% stenosis in 2018 and has not been repeated.  She is on statin medication.  History of depression treated with PPI.  Husband is chronically ill and requires a great deal of her time.  Plan: She will continue with current medications.  I am pleased that her back pain has improved.  Medical issues are stable.  Try to walk some for exercise.  Return in 6 months.   Subjective:   Patient presents for Medicare Annual/Subsequent preventive examination.  Review Past Medical/Family/Social: See above-husband is chronically ill   Risk Factors  Current exercise habits: Since having back  pain not so much exercise Dietary issues discussed: Yes  Cardiac risk factors: Hyperlipidemia  Depression Screen  (Note: if answer to either of the following is "Yes", a more complete depression screening is indicated)   Over the past two weeks, have you felt down, depressed or hopeless? No  Over the past two weeks, have you felt little interest or pleasure in doing things? No Have you lost interest or pleasure in  daily life? No Do you often feel hopeless? No Do you cry easily over simple problems? No   Activities of Daily Living  In your present state of health, do you have any difficulty performing the following activities?:   Driving? No  Managing money? No  Feeding yourself? No  Getting from bed to chair? No  Climbing a flight of stairs? No  Preparing food and eating?: No  Bathing or showering? No  Getting dressed: No  Getting to the toilet? No  Using the toilet:No  Moving around from place to place: No  In the past year have you fallen or had a near fall?:  He has Are you sexually active? No  Do you have more than one partner? No   Hearing Difficulties:  Do you often ask people to speak up or repeat themselves?  Yes-consider hearing evaluation Do you experience ringing or noises in your ears? No  Do you have difficulty understanding soft or whispered voices?  He has Do you feel that you have a problem with memory? No Do you often misplace items? No    Home Safety:  Do you have a smoke alarm at your residence? Yes Do you have grab bars in the bathroom?  Yes Do you have throw rugs in your house?  Noted   Cognitive Testing  Alert? Yes Normal Appearance?Yes  Oriented to person? Yes Place? Yes  Time? Yes  Recall of three objects? Yes  Can perform simple calculations? Yes  Displays appropriate judgment?Yes  Can read the correct time from a watch face?Yes   List the Names of Other Physician/Practitioners you currently use:  See referral list for the physicians patient is currently seeing.     Review of Systems: Back pain has improved   Objective:     General appearance: Appears stated age  Head: Normocephalic, without obvious abnormality, atraumatic  Eyes: conj clear, EOMi PEERLA  Ears: normal TM's and external ear canals both ears  Nose: Nares normal. Septum midline. Mucosa normal. No drainage or sinus tenderness.  Throat: lips, mucosa, and tongue normal; teeth and  gums normal  Neck: no adenopathy, no carotid bruit, no JVD, supple, symmetrical, trachea midline and thyroid not enlarged, symmetric, no tenderness/mass/nodules  No CVA tenderness.  Lungs: clear to auscultation bilaterally  Breasts: normal appearance, no masses or tenderness Heart: regular rate and rhythm, S1, S2 normal, no murmur, click, rub or gallop  Abdomen: soft, non-tender; bowel sounds normal; no masses, no organomegaly  Musculoskeletal: ROM normal in all joints, no crepitus, no deformity, Normal muscle strengthen. Back  is symmetric, no curvature. Skin: Skin color, texture, turgor normal. No rashes or lesions  Lymph nodes: Cervical, supraclavicular, and axillary nodes normal.  Neurologic: CN 2 -12 Normal, Normal symmetric reflexes. Normal coordination and gait  Psych: Alert & Oriented x 3, Mood appear stable.    Assessment:    Annual wellness medicare exam   Plan:    During the course of the visit the patient was educated and counseled about appropriate screening and preventive services including:  Flu vaccine given 09/28/2021  Had pneumococcal 23 in 2021  Recommend Shingrix vaccine in the future  Is only had 2 COVID vaccines in 2021 none this year  Recommend annual mammogram  Had colonoscopy in 2016 at [facility]     Patient Instructions (the written plan) was given to the patient.  Medicare Attestation  I have personally reviewed:  The patient's medical and social history  Their use of alcohol, tobacco or illicit drugs  Their current medications and supplements  The patient's functional ability including ADLs,fall risks, home safety risks, cognitive, and hearing and visual impairment  Diet and physical activities  Evidence for depression or mood disorders  The patient's weight, height, BMI, and visual acuity have been recorded in the chart. I have made referrals, counseling, and provided education to the patient based on review of the above and I have provided  the patient with a written personalized care plan for preventive services.

## 2021-10-21 ENCOUNTER — Other Ambulatory Visit: Payer: Self-pay | Admitting: Internal Medicine

## 2021-11-17 DIAGNOSIS — M549 Dorsalgia, unspecified: Secondary | ICD-10-CM | POA: Insufficient documentation

## 2021-12-19 ENCOUNTER — Other Ambulatory Visit: Payer: Self-pay | Admitting: Internal Medicine

## 2022-01-12 ENCOUNTER — Telehealth: Payer: Self-pay

## 2022-01-12 NOTE — Telephone Encounter (Signed)
Patient was a no show for MRI test in October. I tried to call patient but there was no VM that picked up. Unable to leave message. Will try again later. Need to know if she is still interested in testing.

## 2022-03-26 ENCOUNTER — Other Ambulatory Visit: Payer: Medicare Other

## 2022-03-26 DIAGNOSIS — E7439 Other disorders of intestinal carbohydrate absorption: Secondary | ICD-10-CM

## 2022-03-26 DIAGNOSIS — E782 Mixed hyperlipidemia: Secondary | ICD-10-CM

## 2022-03-27 LAB — HEPATIC FUNCTION PANEL
AG Ratio: 1.6 (calc) (ref 1.0–2.5)
ALT: 25 U/L (ref 6–29)
AST: 22 U/L (ref 10–35)
Albumin: 4.1 g/dL (ref 3.6–5.1)
Alkaline phosphatase (APISO): 99 U/L (ref 37–153)
Bilirubin, Direct: 0.1 mg/dL (ref 0.0–0.2)
Globulin: 2.6 g/dL (calc) (ref 1.9–3.7)
Indirect Bilirubin: 0.4 mg/dL (calc) (ref 0.2–1.2)
Total Bilirubin: 0.5 mg/dL (ref 0.2–1.2)
Total Protein: 6.7 g/dL (ref 6.1–8.1)

## 2022-03-27 LAB — HEMOGLOBIN A1C
Hgb A1c MFr Bld: 6.3 % of total Hgb — ABNORMAL HIGH (ref <5.7)
Mean Plasma Glucose: 134 mg/dL
eAG (mmol/L): 7.4 mmol/L

## 2022-03-27 LAB — LIPID PANEL
Cholesterol: 196 mg/dL (ref <200)
HDL: 49 mg/dL — ABNORMAL LOW (ref >=50)
LDL Cholesterol (Calc): 115 mg/dL (calc) — ABNORMAL HIGH
Non-HDL Cholesterol (Calc): 147 mg/dL (calc) — ABNORMAL HIGH (ref <130)
Total CHOL/HDL Ratio: 4 (calc) (ref <5.0)
Triglycerides: 197 mg/dL — ABNORMAL HIGH (ref <150)

## 2022-04-05 ENCOUNTER — Encounter: Payer: Self-pay | Admitting: Internal Medicine

## 2022-04-05 ENCOUNTER — Ambulatory Visit (INDEPENDENT_AMBULATORY_CARE_PROVIDER_SITE_OTHER): Payer: Medicare Other | Admitting: Internal Medicine

## 2022-04-05 VITALS — BP 110/70 | HR 76 | Temp 98.4°F | Ht 63.0 in | Wt 184.2 lb

## 2022-04-05 DIAGNOSIS — E782 Mixed hyperlipidemia: Secondary | ICD-10-CM | POA: Diagnosis not present

## 2022-04-05 DIAGNOSIS — E7439 Other disorders of intestinal carbohydrate absorption: Secondary | ICD-10-CM | POA: Diagnosis not present

## 2022-04-05 DIAGNOSIS — R0989 Other specified symptoms and signs involving the circulatory and respiratory systems: Secondary | ICD-10-CM | POA: Diagnosis not present

## 2022-04-05 DIAGNOSIS — Z8249 Family history of ischemic heart disease and other diseases of the circulatory system: Secondary | ICD-10-CM

## 2022-04-05 DIAGNOSIS — M5416 Radiculopathy, lumbar region: Secondary | ICD-10-CM

## 2022-04-05 MED ORDER — METFORMIN HCL 500 MG PO TABS: 500.0000 mg | ORAL_TABLET | Freq: Two times a day (BID) | ORAL | 3 refills | Status: DC

## 2022-04-05 NOTE — Progress Notes (Signed)
? ?  Subjective:  ? ? Patient ID: Donna Spears, female    DOB: 08-29-1956, 66 y.o.   MRN: [id number] ? ?HPI 66 year old Female seen for 30-month recheck.  In September 2022 she was having right radiculopathy.  She was seen by orthopedist in October 2022.  Was treated with tizanidine and gabapentin.  She subsequently improved. ? ?Still has some intermittent discomfort. ? ?She has a history of impaired glucose tolerance and hyperlipidemia.  History of GE reflux. ? ?Her hemoglobin A1c is 6.3% and was 5.9% in October.  Her triglycerides have increased from 161 in October to 194.  She has a low HDL of 49.  LDL cholesterol is the very same as in October 2022 at 115. ? ?I am concerned about her increased hemoglobin A1c and she agrees to start metformin. ? ?However she does not want to change current dose of Lipitor (atorvastatin) which is currently at 40 mg daily since October 2022.  Liver functions are normal on statin medication. ? ? ? ?Review of Systems see above ? ?   ?Objective:  ? Physical Exam ?Blood pressure is excellent 110/70 pulse 76 pulse oximetry 96% weight 184 pounds 4 ounces BMI 32.64 ? ?Chest is clear to auscultation.  Cardiac exam: Regular rate and rhythm without murmurs or ectopy.  No lower extremity edema. ? ? ? ?   ?Assessment & Plan:  ?History of lumbar radiculopathy-seen by orthopedist and improved ? ?Hyperlipidemia-treated with atorvastatin.  She does not want to increase dose despite triglycerides increasing from 1 61-1 97.  Her LDL is exactly the same as it was in October 2022 at 115 and she has low HDL cholesterol although it has increased seven-point since October 2022 but still less than 50.Marland Kitchen  Her total cholesterol is 196 and previously in October 2022 it was 185. ? ?Type 2 diabetes mellitus-hemoglobin A1c has increased considerably from 5.9% to 6.3%.  She prefers to try to handle this with diet. ? ?Plan: She will try diet and exercise and follow-up in July for lipid panel and hemoglobin A1c.  She  will try to be compliant with medications. ? ?Recommended CT calcium scoring.  Have also ordered an MRI of the LS spine and a mammogram. ? ?History of left carotid bruit and has estimated ICA stenosis of less than 50% based on carotid ultrasound in November 2021. ? ?

## 2022-04-05 NOTE — Patient Instructions (Addendum)
I need for you to be compliant with medications, increase diet and exercise efforts. Have coronary calcium scoring. RTC in 3 months.  Mammogram ordered.  Patient agrees to try metformin 500 mg twice daily for impaired glucose tolerance ?

## 2022-05-01 ENCOUNTER — Encounter (HOSPITAL_COMMUNITY): Payer: Self-pay

## 2022-06-16 ENCOUNTER — Other Ambulatory Visit: Payer: Self-pay | Admitting: Internal Medicine

## 2022-06-29 ENCOUNTER — Other Ambulatory Visit: Payer: Medicare Other

## 2022-06-29 ENCOUNTER — Other Ambulatory Visit: Payer: Self-pay | Admitting: Internal Medicine

## 2022-06-29 DIAGNOSIS — E7439 Other disorders of intestinal carbohydrate absorption: Secondary | ICD-10-CM

## 2022-06-29 DIAGNOSIS — E782 Mixed hyperlipidemia: Secondary | ICD-10-CM

## 2022-07-02 ENCOUNTER — Other Ambulatory Visit: Payer: Medicare Other

## 2022-07-02 DIAGNOSIS — E7439 Other disorders of intestinal carbohydrate absorption: Secondary | ICD-10-CM

## 2022-07-02 DIAGNOSIS — E782 Mixed hyperlipidemia: Secondary | ICD-10-CM

## 2022-07-03 LAB — LIPID PANEL
Cholesterol: 180 mg/dL (ref <200)
HDL: 49 mg/dL — ABNORMAL LOW (ref >=50)
LDL Cholesterol (Calc): 102 mg/dL (calc) — ABNORMAL HIGH
Non-HDL Cholesterol (Calc): 131 mg/dL (calc) — ABNORMAL HIGH (ref <130)
Total CHOL/HDL Ratio: 3.7 (calc) (ref <5.0)
Triglycerides: 175 mg/dL — ABNORMAL HIGH (ref <150)

## 2022-07-03 LAB — HEMOGLOBIN A1C
Hgb A1c MFr Bld: 6 % of total Hgb — ABNORMAL HIGH (ref <5.7)
Mean Plasma Glucose: 126 mg/dL
eAG (mmol/L): 7 mmol/L

## 2022-07-05 ENCOUNTER — Encounter: Payer: Self-pay | Admitting: Internal Medicine

## 2022-07-05 ENCOUNTER — Ambulatory Visit (INDEPENDENT_AMBULATORY_CARE_PROVIDER_SITE_OTHER): Payer: Medicare Other | Admitting: Internal Medicine

## 2022-07-05 VITALS — BP 106/68 | HR 68 | Temp 97.9°F | Wt 179.0 lb

## 2022-07-05 DIAGNOSIS — E119 Type 2 diabetes mellitus without complications: Secondary | ICD-10-CM

## 2022-07-05 DIAGNOSIS — E782 Mixed hyperlipidemia: Secondary | ICD-10-CM | POA: Diagnosis not present

## 2022-07-05 DIAGNOSIS — R0989 Other specified symptoms and signs involving the circulatory and respiratory systems: Secondary | ICD-10-CM

## 2022-07-05 DIAGNOSIS — K219 Gastro-esophageal reflux disease without esophagitis: Secondary | ICD-10-CM | POA: Diagnosis not present

## 2022-07-05 DIAGNOSIS — Z8659 Personal history of other mental and behavioral disorders: Secondary | ICD-10-CM | POA: Diagnosis not present

## 2022-07-05 DIAGNOSIS — F439 Reaction to severe stress, unspecified: Secondary | ICD-10-CM

## 2022-07-05 DIAGNOSIS — E7439 Other disorders of intestinal carbohydrate absorption: Secondary | ICD-10-CM | POA: Diagnosis not present

## 2022-07-05 NOTE — Progress Notes (Signed)
   Subjective:    Patient ID: Donna Spears, female    DOB: Dec 20, 1956, 66 y.o.   MRN: [id number]  HPI  66 year old Female  seen for follow-up on medical issues.  She was here April 2023 for 59-month recheck.  In the Fall 2022, she was having right radiculopathy and saw orthopedist in October 2022.  She was treated with Tizanidine and gabapentin.  She improved.  Still has some intermittent back discomfort.  She has a history of impaired glucose tolerance, GE reflux and hyperlipidemia.  At last visit in April 2023, she was started on metformin for glucose intolerance.  She did not want to increase dose of statin medication at that time although her triglycerides had increased from 161 in October 2022 to 197.  Her recent lipid panel showed improvement in triglycerides to 175.  There is improvement in LDL from 115 in April to 102.  HDL remains low at 49 and total cholesterol is 180.  Still has situational stress with her husband who has chronic medical issues.  Hx of left carotid bruit.  A carotid ultrasound performed in April 2022 showed right ICA stenosis less than 50% and carotid duplex study in October 2018 showing 1 to 39% stenosis in left ICA.   Effexor XR 75 mg was refilled today for depression.  Hemoglobin A1c stable at 6% on metformin alone.  Recommend flu vaccine in the Fall.  Consider pneumococcal 20 vaccine.  Consider COVID booster with new variant late September 2023.  Tetanus immunization is due and can be obtained at local pharmacy since she is now on Medicare.  Needs to consider Shingrix vaccines to prevent shingles.  Coronary CT score has been ordered in April but patient has not completed study as of this visit in July.   Review of Systems see above seems a bit less stressed but has     Objective:   Physical Exam Blood sugar excellent at 106/68 pulse 68 temperature 97.9 degrees pulse oximetry 96% weight 179 pounds BMI 31.71  Skin: Warm and dry.  Nodes none.  Left carotid bruit  noted.  Cardiac exam: Regular rate and rhythm without ectopy.  No lower extremity pitting edema.  Affect thought and judgment are normal.        Assessment & Plan:  Left carotid bruit-continue to monitor.  Has had carotid Doppler and carotid duplex studies in the past.  Does not want to increase dose of statin  Hyperlipidemia-does not want to increase dose of statin.  Recommended coronary calcium score.  Impaired glucose tolerance treated with metformin  Musculoskeletal pain treated with meloxicam  Anxiety treated with alprazolam  GE reflux treated with Protonix  Depression treated with Effexor 75 mg daily  Plan: Due for Medicare wellness visit Fall 2023 but has not made appointment.  We have reached out to patient previously and ask her to call central scheduling at [phone] to get coronary calcium score scheduled.

## 2022-07-18 ENCOUNTER — Other Ambulatory Visit: Payer: Self-pay | Admitting: Internal Medicine

## 2022-08-18 ENCOUNTER — Other Ambulatory Visit: Payer: Self-pay | Admitting: Internal Medicine

## 2022-08-22 ENCOUNTER — Encounter: Payer: Self-pay | Admitting: Internal Medicine

## 2022-08-22 NOTE — Patient Instructions (Addendum)
Please call central scheduling at [phone] to get coronary calcium score scheduled.  We have no return appointment for you scheduled.  There will be no future refill on your prescriptions until you make this appointment annual Medicare wellness visit and health maintenance exam.

## 2022-09-19 ENCOUNTER — Other Ambulatory Visit: Payer: Self-pay | Admitting: Internal Medicine

## 2022-12-05 ENCOUNTER — Telehealth: Payer: Self-pay | Admitting: Internal Medicine

## 2022-12-05 ENCOUNTER — Ambulatory Visit (INDEPENDENT_AMBULATORY_CARE_PROVIDER_SITE_OTHER): Payer: Medicare Other

## 2022-12-05 VITALS — Wt 180.0 lb

## 2022-12-05 DIAGNOSIS — Z0000 Encounter for general adult medical examination without abnormal findings: Secondary | ICD-10-CM | POA: Diagnosis not present

## 2022-12-05 NOTE — Progress Notes (Signed)
Subjective:   Donna Spears is a 66 y.o. female who presents for Medicare Annual (Subsequent) preventive examination.  Review of Systems           Objective:    There were no vitals filed for this visit. There is no height or weight on file to calculate BMI.      No data to display           Current Medications (verified) Outpatient Encounter Medications as of 12/05/2022  Medication Sig   ALPRAZolam (XANAX) 0.5 MG tablet Take 1 tablet (0.5 mg total) by mouth at bedtime.   atorvastatin (LIPITOR) 40 MG tablet TAKE 1 TABLET BY MOUTH DAILY   clobetasol cream (TEMOVATE) 0.05 % Apply 1 application topically 2 (two) times daily. (Patient taking differently: Apply 1 application  topically as needed.)   clotrimazole (LOTRIMIN) 1 % external solution Apply 1 application topically 2 (two) times daily. In between toes   econazole nitrate 1 % cream Very small amount in between toes. If excessive moisture is noted stop using   fluticasone (FLONASE) 50 MCG/ACT nasal spray USE 1 SPRAY IN BOTH  NOSTRILS TWICE DAILY   fluticasone-salmeterol (ADVAIR) 250-50 MCG/ACT AEPB USE 1 INHALATION BY MOUTH  EVERY 12 HOURS   Hyoscyamine Sulfate SL (LEVSIN/SL) 0.125 MG SUBL One sublingual one half hour before meals   meloxicam (MOBIC) 15 MG tablet TAKE 1 TABLET (15 MG TOTAL) BY MOUTH DAILY.   metFORMIN (GLUCOPHAGE) 500 MG tablet Take 1 tablet (500 mg total) by mouth 2 (two) times daily with a meal.   Multiple Vitamins-Minerals (ALIVE WOMENS 50+ PO) Take by mouth.   pantoprazole (PROTONIX) 40 MG tablet TAKE 1 TABLET BY MOUTH  DAILY   venlafaxine (EFFEXOR) 75 MG tablet TAKE 1 TABLET BY MOUTH  DAILY   [DISCONTINUED] mometasone-formoterol (DULERA) 200-5 MCG/ACT AERO Inhale 2 puffs into the lungs 2 (two) times daily.   No facility-administered encounter medications on file as of 12/05/2022.    Allergies (verified) Azithromycin, Erythromycin, and Other   History: Past Medical History:  Diagnosis Date    Allergy    Anemia    Asthma    Depression    GERD (gastroesophageal reflux disease)    Hyperlipidemia    No past surgical history on file. Family History  Problem Relation Age of Onset   Heart disease Mother    Hypertension Mother    Hyperlipidemia Father    Heart disease Brother    Hypertension Brother    Social History   Socioeconomic History   Marital status: Married    Spouse name: Not on file   Number of children: Not on file   Years of education: Not on file   Highest education level: Not on file  Occupational History   Not on file  Tobacco Use   Smoking status: Never   Smokeless tobacco: Never  Substance and Sexual Activity   Alcohol use: No   Drug use: No   Sexual activity: Yes    Birth control/protection: None  Other Topics Concern   Not on file  Social History Narrative   Not on file   Social Determinants of Health   Financial Resource Strain: Not on file  Food Insecurity: Not on file  Transportation Needs: Not on file  Physical Activity: Not on file  Stress: Not on file  Social Connections: Not on file    Tobacco Counseling Counseling given: Not Answered   Clinical Intake:  Diabetic? No         Activities of Daily Living     No data to display           Patient Care Team: Donna Spears, Donna Cole, MD as PCP - General (Internal Medicine)  Indicate any recent Medical Services you may have received from other than [facility] providers in the past year (date may be approximate).     Assessment:   This is a routine wellness examination for Donna Spears.  Hearing/Vision screen No results found.  Dietary issues and exercise activities discussed:     Goals Addressed   None   Depression Screen    09/28/2021   11:11 AM 10/15/2020    8:03 PM 04/08/2020    9:45 AM 03/02/2019   11:05 AM 10/10/2017   10:37 AM 07/01/2017   11:09 AM 05/05/2016    2:46 PM  PHQ 2/9 Scores  PHQ - 2 Score 0 0 0 0 0 0 0  PHQ- 9 Score     2       Fall Risk    09/28/2021   11:10 AM 09/15/2021   12:09 PM 09/26/2020   11:18 AM 04/08/2020    9:45 AM 10/10/2017   10:37 AM  Fall Risk   Falls in the past year? 1 0 0 0 No  Number falls in past yr: 0 0 0 0   Injury with Fall? 0 0 0 0   Risk for fall due to : History of fall(s) No Fall Risks  No Fall Risks   Follow up Falls evaluation completed Falls evaluation completed Falls evaluation completed Falls evaluation completed     FALL RISK PREVENTION PERTAINING TO THE HOME:  Any stairs in or around the home? Yes  If so, are there any without handrails? Yes  Home free of loose throw rugs in walkways, pet beds, electrical cords, etc? Yes  Adequate lighting in your home to reduce risk of falls? Yes   ASSISTIVE DEVICES UTILIZED TO PREVENT FALLS:  Life alert? No  Use of a cane, walker or w/c? No  Grab bars in the bathroom? No  Shower chair or bench in shower? Yes  Elevated toilet seat or a handicapped toilet? Yes   TIMED UP AND GO:   Cognitive Function:        09/28/2021   11:09 AM  6CIT Screen  What Year? 0 points  What month? 0 points  What time? 0 points  Count back from 20 0 points  Months in reverse 0 points  Repeat phrase 0 points  Total Score 0 points    Immunizations Immunization History  Administered Date(s) Administered   Hepatitis A, Adult 08/01/2015   Hepatitis B, adult 08/01/2015, 09/30/2015   Influenza,inj,Quad PF,6+ Mos 10/01/2013, 10/21/2014, 09/30/2015, 01/17/2017, 10/10/2017, 10/25/2018, 09/18/2019, 09/28/2021   PFIZER(Purple Top)SARS-COV-2 Vaccination 02/25/2020, 03/15/2020   Pneumococcal Polysaccharide-23 10/31/2020   Tdap 08/12/2012   Zoster, Live 07/11/2015    TDAP status: Due, Education has been provided regarding the importance of this vaccine. Advised may receive this vaccine at local pharmacy or Health Dept. Aware to provide a copy of the vaccination record if obtained from local pharmacy or Health Dept. Verbalized acceptance and  understanding.  Flu Vaccine status: Due, Education has been provided regarding the importance of this vaccine. Advised may receive this vaccine at local pharmacy or Health Dept. Aware to provide a copy of the vaccination record if obtained from local pharmacy or Health Dept. Verbalized acceptance and understanding.  Pneumococcal vaccine  status: Due, Education has been provided regarding the importance of this vaccine. Advised may receive this vaccine at local pharmacy or Health Dept. Aware to provide a copy of the vaccination record if obtained from local pharmacy or Health Dept. Verbalized acceptance and understanding.  Covid-19 vaccine status: Information provided on how to obtain vaccines.   Qualifies for Shingles Vaccine? Yes   Zostavax completed No   Shingrix Completed?: No.    Education has been provided regarding the importance of this vaccine. Patient has been advised to call insurance company to determine out of pocket expense if they have not yet received this vaccine. Advised may also receive vaccine at local pharmacy or Health Dept. Verbalized acceptance and understanding.  Screening Tests Health Maintenance  Topic Date Due   Hepatitis C Screening  Never done   Zoster Vaccines- Shingrix (1 of 2) Never done   Pneumonia Vaccine 65+ Years old (2 - PCV) 10/31/2021   INFLUENZA VACCINE  07/24/2022   DTaP/Tdap/Td (2 - Td or Tdap) 08/12/2022   COVID-19 Vaccine (4 - 2023-24 season) 08/24/2022   MAMMOGRAM  10/20/2022   Medicare Annual Wellness (AWV)  12/06/2023   COLONOSCOPY (Pts 45-33yrs Insurance coverage will need to be confirmed)  09/19/2025   DEXA SCAN  Completed   HPV VACCINES  Aged Out    Health Maintenance  Health Maintenance Due  Topic Date Due   Hepatitis C Screening  Never done   Zoster Vaccines- Shingrix (1 of 2) Never done   Pneumonia Vaccine 70+ Years old (2 - PCV) 10/31/2021   INFLUENZA VACCINE  07/24/2022   DTaP/Tdap/Td (2 - Td or Tdap) 08/12/2022   COVID-19  Vaccine (4 - 2023-24 season) 08/24/2022   MAMMOGRAM  10/20/2022    Colorectal cancer screening: Type of screening: Colonoscopy. Completed 09/20/15. Repeat every 10 years  Mammogram status: Completed 10/20/20. Repeat every year 2  Bone Density status: Completed 07/08/19. Results reflect: Bone density results: NORMAL. Repeat every TBD years.  Lung Cancer Screening: (Low Dose CT Chest recommended if Age 58-80 years, 30 pack-year currently smoking OR have quit w/in 15years.) does not qualify.   Lung Cancer Screening Referral: N/A  Additional Screening:  Hepatitis C Screening: does not qualify; Completed N/A  Vision Screening: Recommended annual ophthalmology exams for early detection of glaucoma and other disorders of the eye. Is the patient up to date with their annual eye exam?  Yes  Who is the provider or what is the name of the office in which the patient attends annual eye exams? TBD If pt is not established with a provider, would they like to be referred to a provider to establish care? No .   Dental Screening: Recommended annual dental exams for proper oral hygiene  Community Resource Referral / Chronic Care Management: CRR required this visit?  No   CCM required this visit?  No      Plan:     I have personally reviewed and noted the following in the patient's chart:   Medical and social history Use of alcohol, tobacco or illicit drugs  Current medications and supplements including opioid prescriptions. Patient is not currently taking opioid prescriptions. Functional ability and status Nutritional status Physical activity Advanced directives List of other physicians Hospitalizations, surgeries, and ER visits in previous 12 months Vitals Screenings to include cognitive, depression, and falls Referrals and appointments  In addition, I have reviewed and discussed with patient certain preventive protocols, quality metrics, and best practice recommendations. A written  personalized care plan for  preventive services as well as general preventive health recommendations were provided to patient.     Lynnea MaizesLaVon D Ladashia Demarinis, CMA   12/05/2022   Nurse Notes:

## 2022-12-05 NOTE — Telephone Encounter (Signed)
Called to schedule AMW and 6 month ov and labs

## 2023-01-02 ENCOUNTER — Telehealth: Payer: Self-pay

## 2023-01-02 MED ORDER — HYOSCYAMINE SULFATE SL 0.125 MG SL SUBL: SUBLINGUAL_TABLET | SUBLINGUAL | 1 refills | Status: DC

## 2023-01-02 NOTE — Telephone Encounter (Signed)
Error

## 2023-01-04 ENCOUNTER — Other Ambulatory Visit: Payer: Medicare Other

## 2023-01-07 ENCOUNTER — Ambulatory Visit: Payer: Medicare Other | Admitting: Internal Medicine

## 2023-01-18 ENCOUNTER — Other Ambulatory Visit: Payer: Medicare Other

## 2023-01-21 ENCOUNTER — Ambulatory Visit: Payer: Medicare Other | Admitting: Internal Medicine

## 2023-01-31 ENCOUNTER — Other Ambulatory Visit: Payer: Medicare Other

## 2023-02-01 ENCOUNTER — Ambulatory Visit: Payer: Medicare Other | Admitting: Internal Medicine

## 2023-02-02 ENCOUNTER — Other Ambulatory Visit: Payer: Self-pay | Admitting: Internal Medicine

## 2023-03-12 ENCOUNTER — Other Ambulatory Visit: Payer: Medicare Other

## 2023-03-14 ENCOUNTER — Other Ambulatory Visit: Payer: Medicare Other

## 2023-03-14 DIAGNOSIS — E782 Mixed hyperlipidemia: Secondary | ICD-10-CM

## 2023-03-14 DIAGNOSIS — E119 Type 2 diabetes mellitus without complications: Secondary | ICD-10-CM

## 2023-03-15 LAB — HEPATIC FUNCTION PANEL
AG Ratio: 1.5 (calc) (ref 1.0–2.5)
ALT: 27 U/L (ref 6–29)
AST: 21 U/L (ref 10–35)
Albumin: 4.1 g/dL (ref 3.6–5.1)
Alkaline phosphatase (APISO): 88 U/L (ref 37–153)
Bilirubin, Direct: 0.1 mg/dL (ref 0.0–0.2)
Globulin: 2.7 g/dL (calc) (ref 1.9–3.7)
Indirect Bilirubin: 0.3 mg/dL (calc) (ref 0.2–1.2)
Total Bilirubin: 0.4 mg/dL (ref 0.2–1.2)
Total Protein: 6.8 g/dL (ref 6.1–8.1)

## 2023-03-15 LAB — LIPID PANEL
Cholesterol: 184 mg/dL (ref <200)
HDL: 54 mg/dL (ref >=50)
LDL Cholesterol (Calc): 104 mg/dL (calc) — ABNORMAL HIGH
Non-HDL Cholesterol (Calc): 130 mg/dL (calc) — ABNORMAL HIGH (ref <130)
Total CHOL/HDL Ratio: 3.4 (calc) (ref <5.0)
Triglycerides: 147 mg/dL (ref <150)

## 2023-03-15 LAB — HEMOGLOBIN A1C
Hgb A1c MFr Bld: 6.1 % of total Hgb — ABNORMAL HIGH (ref <5.7)
Mean Plasma Glucose: 128 mg/dL
eAG (mmol/L): 7.1 mmol/L

## 2023-03-18 ENCOUNTER — Ambulatory Visit (INDEPENDENT_AMBULATORY_CARE_PROVIDER_SITE_OTHER): Payer: Medicare Other | Admitting: Internal Medicine

## 2023-03-18 ENCOUNTER — Encounter: Payer: Self-pay | Admitting: Internal Medicine

## 2023-03-18 VITALS — BP 124/82 | HR 79 | Temp 98.4°F | Ht 63.0 in | Wt 168.1 lb

## 2023-03-18 DIAGNOSIS — E119 Type 2 diabetes mellitus without complications: Secondary | ICD-10-CM | POA: Diagnosis not present

## 2023-03-18 DIAGNOSIS — E782 Mixed hyperlipidemia: Secondary | ICD-10-CM | POA: Diagnosis not present

## 2023-03-18 DIAGNOSIS — F439 Reaction to severe stress, unspecified: Secondary | ICD-10-CM

## 2023-03-18 DIAGNOSIS — Z23 Encounter for immunization: Secondary | ICD-10-CM

## 2023-03-18 DIAGNOSIS — Z0000 Encounter for general adult medical examination without abnormal findings: Secondary | ICD-10-CM

## 2023-03-18 DIAGNOSIS — Z8659 Personal history of other mental and behavioral disorders: Secondary | ICD-10-CM

## 2023-03-18 NOTE — Progress Notes (Signed)
Patient Care Team: Elby Showers, MD as PCP - General (Internal Medicine)  Visit Date: 03/18/23  Subjective:    Patient ID: Donna Spears , Female   DOB: September 19, 1956, 67 y.o.    MRN: [id number]   67 y.o. Female presents today for a 6 month follow-up. Patient has a past medical history of allergies, anemia, asthma, depression, GERD, hyperlipidemia.  History of hyperlipidemia treated with Lipitor 40 mg daily. LDL slightly elevated at 104 on 03/14/23.  History of glucose intolerance treated with Glucophage 500 mg twice daily with meals. She is currently taking this only once daily and prefers to focus on healthy diet and exercise to achieve weight loss. HGBA1c at 6.1% on 03/14/23, up from 6.0% on 07/02/22. Walks for 30 minute intervals regularly. Denies swelling in legs/ankles.  Mammogram last completed 10/20/20. Negative. She will schedule a repeat for this year.  Situational stress discussed. Has Xanax if needed.   Past Medical History:  Diagnosis Date   Allergy    Anemia    Asthma    Depression    GERD (gastroesophageal reflux disease)    Hyperlipidemia      Family History  Problem Relation Age of Onset   Heart disease Mother    Hypertension Mother    Hyperlipidemia Father    Heart disease Brother    Hypertension Brother     Social History   Social History Narrative   Not on file      Review of Systems  Constitutional:  Negative for fever and malaise/fatigue.  HENT:  Negative for congestion.   Eyes:  Negative for blurred vision.  Respiratory:  Negative for cough and shortness of breath.   Cardiovascular:  Negative for chest pain, palpitations and leg swelling.  Gastrointestinal:  Negative for vomiting.  Musculoskeletal:  Negative for back pain.  Skin:  Negative for rash.  Neurological:  Negative for loss of consciousness and headaches.        Objective:   Vitals: BP 124/82   Pulse 79   Temp 98.4 F (36.9 C) (Tympanic)   Ht 5\' 3"  (1.6 m)   Wt 168 lb  1.9 oz (76.3 kg)   SpO2 99%   BMI 29.78 kg/m    Physical Exam Vitals and nursing note reviewed.  Constitutional:      General: She is not in acute distress.    Appearance: Normal appearance. She is not toxic-appearing.  HENT:     Head: Normocephalic and atraumatic.  Cardiovascular:     Rate and Rhythm: Normal rate and regular rhythm. No extrasystoles are present.    Pulses: Normal pulses.     Heart sounds: Normal heart sounds. No murmur heard.    No friction rub. No gallop.  Pulmonary:     Effort: Pulmonary effort is normal. No respiratory distress.     Breath sounds: Normal breath sounds. No wheezing or rales.  Musculoskeletal:     Right lower leg: No edema.     Left lower leg: No edema.  Skin:    General: Skin is warm and dry.  Neurological:     Mental Status: She is alert and oriented to person, place, and time. Mental status is at baseline.  Psychiatric:        Mood and Affect: Mood normal.        Behavior: Behavior normal.        Thought Content: Thought content normal.        Judgment: Judgment normal.  Results:   Studies obtained and personally reviewed by me:  Mammogram last completed 10/20/20. Negative.   Labs:       Component Value Date/Time   NA 144 09/25/2021 0927   K 4.7 09/25/2021 0927   CL 108 09/25/2021 0927   CO2 27 09/25/2021 0927   GLUCOSE 115 (H) 09/25/2021 0927   BUN 16 09/25/2021 0927   CREATININE 0.57 09/25/2021 0927   CALCIUM 9.5 09/25/2021 0927   PROT 6.8 03/14/2023 0910   ALBUMIN 3.9 01/17/2017 1023   AST 21 03/14/2023 0910   ALT 27 03/14/2023 0910   ALKPHOS 87 01/17/2017 1023   BILITOT 0.4 03/14/2023 0910   GFRNONAA 95 09/19/2020 1116   GFRAA 110 09/19/2020 1116     Lab Results  Component Value Date   WBC 5.0 09/25/2021   HGB 14.4 09/25/2021   HCT 41.8 09/25/2021   MCV 89.5 09/25/2021   PLT 224 09/25/2021    Lab Results  Component Value Date   CHOL 184 03/14/2023   HDL 54 03/14/2023   LDLCALC 104 (H)  03/14/2023   TRIG 147 03/14/2023   CHOLHDL 3.4 03/14/2023    Lab Results  Component Value Date   HGBA1C 6.1 (H) 03/14/2023     Lab Results  Component Value Date   TSH 2.43 09/25/2021      Assessment & Plan:   Hyperlipidemia: treated with Lipitor 40 mg daily. LDL slightly elevated at 104 on 03/14/23.  Diabetes mellitus: treated with Glucophage 500 mg twice daily with meals. She is currently taking this only once daily and prefers to focus on healthy diet and exercise to achieve weight loss. HGBA1c at 6.1% on 03/14/23, up from 6.0% on 07/02/22. Walks for 30 minute intervals regularly.   Mammogram: last completed 10/20/20. Negative. Mammogram ordered and number given to make appointment.  Vaccine counseling: will go to pharmacy for tetanus vaccine. Administered pneumococcal 20 vaccine. Flu vaccine declined.  Situational stress.  Recommended [facility] Nancy for counseling.  Husband is chronically ill  I,Alexander Ruley,acting as a scribe for Elby Showers, MD.,have documented all relevant documentation on the behalf of Elby Showers, MD,as directed by  Elby Showers, MD while in the presence of Elby Showers, MD.   I, Elby Showers, MD, have reviewed all documentation for this visit. The documentation on 03/18/23 for the exam, diagnosis, procedures, and orders are all accurate and complete.

## 2023-03-18 NOTE — Patient Instructions (Addendum)
Continue to work on diet and exercise efforts. Tdap to be done at pharmacy. Pneumococcal 20 vaccine given. Counselor suggested for situational stress  No change in medications.  Return in 6 months for health maintenance exam and Medicare wellness visit

## 2023-05-14 ENCOUNTER — Other Ambulatory Visit: Payer: Self-pay | Admitting: Internal Medicine

## 2023-06-01 ENCOUNTER — Other Ambulatory Visit: Payer: Self-pay | Admitting: Internal Medicine

## 2023-07-19 ENCOUNTER — Other Ambulatory Visit: Payer: Self-pay | Admitting: Internal Medicine

## 2023-08-05 ENCOUNTER — Other Ambulatory Visit: Payer: Self-pay | Admitting: Internal Medicine

## 2023-08-22 LAB — HM COLONOSCOPY

## 2023-10-04 ENCOUNTER — Other Ambulatory Visit: Payer: Self-pay | Admitting: Internal Medicine

## 2023-10-17 ENCOUNTER — Encounter: Payer: Self-pay | Admitting: Internal Medicine

## 2023-10-17 ENCOUNTER — Ambulatory Visit: Payer: Medicare Other | Admitting: Internal Medicine

## 2023-10-17 ENCOUNTER — Telehealth: Payer: Self-pay

## 2023-10-17 ENCOUNTER — Ambulatory Visit
Admission: RE | Admit: 2023-10-17 | Discharge: 2023-10-17 | Disposition: A | Discharge status: Home / Self Care | Payer: Medicare Other | Source: Ambulatory Visit | Attending: Internal Medicine | Admitting: Internal Medicine

## 2023-10-17 VITALS — BP 130/80 | HR 86 | Ht 63.0 in | Wt 173.0 lb

## 2023-10-17 DIAGNOSIS — M25572 Pain in left ankle and joints of left foot: Secondary | ICD-10-CM

## 2023-10-17 DIAGNOSIS — E7439 Other disorders of intestinal carbohydrate absorption: Secondary | ICD-10-CM | POA: Diagnosis not present

## 2023-10-17 DIAGNOSIS — L304 Erythema intertrigo: Secondary | ICD-10-CM

## 2023-10-17 DIAGNOSIS — M79672 Pain in left foot: Secondary | ICD-10-CM

## 2023-10-17 MED ORDER — OXYCODONE-ACETAMINOPHEN 5-325 MG PO TABS
1.0000 | ORAL_TABLET | Freq: Four times a day (QID) | ORAL | 0 refills | Status: AC | PRN
Start: 2023-10-17 — End: 2023-10-22

## 2023-10-17 MED ORDER — NYSTATIN-TRIAMCINOLONE 100000-0.1 UNIT/GM-% EX CREA: 1.0000 | TOPICAL_CREAM | Freq: Two times a day (BID) | CUTANEOUS | 2 refills | Status: DC

## 2023-10-17 NOTE — Progress Notes (Addendum)
Patient Care Team: Margaree Mackintosh, MD as PCP - General (Internal Medicine)  Visit Date: 10/17/23  Subjective:    Patient ID: Donna Spears , Female   DOB: [birth year], 67 y.o.    MRN: [id number]   67 y.o. Female presents today for left foot pain, swelling since the night of 10/13/23. She was stretching that day. She has been pushing her husband, who is disabled, in a wheelchair frequently. This is causing knee pain. She has had some relief with gel injections but is still in significant pain. Pending right total knee arthroplasty 01/27/24 with Dr. Ollen Gross. She has been sedentary due to knee, ankle pain. Taking meloxicam without relief.   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  She has a college degree and is married to a retired Runner, broadcasting/film/video.  Husband has had considerable health problems and she has been basically his sole caregiver now for some time.  He is in a wheelchair and has had gained weight.  She feels that this issue with her foot and ankle could be due from pushing the heavy wheelchair around.  She does not smoke or consume alcohol.  She has 2 adult children, a son and a daughter.  Additional history: History of hyperlipidemia, depression, GE reflux and situational stress.  History of allergic rhinitis and fibrocystic breast disease.  Had right trigger finger release by Dr. Mina Marble in the remote past.  History of osteoarthritis left knee injected at [facility] in 2020.  Remote history of left carotid bruit with ultrasound in 2018 showing a 1 to 39% stenosis.  History of impaired glucose tolerance and has been treated  with metformin.  Has been treated with Protonix for GE reflux.  Dr. Despina Hick has injected her knees about once a year for arthritis management.  No known drug allergies   Review of Systems  Constitutional:  Negative for fever and malaise/fatigue.  HENT:  Negative for congestion.   Eyes:  Negative for blurred vision.  Respiratory:  Negative for cough and shortness of breath.   Cardiovascular:  Negative for chest pain, palpitations and leg swelling.  Gastrointestinal:  Negative for vomiting.  Musculoskeletal:  Positive for joint pain (Knees). Negative for back pain.       (+) Left foot swelling, pain  Skin:  Negative for rash.  Neurological:  Negative for loss of consciousness and headaches.        Objective:   Vitals: BP 130/80   Pulse 86   Ht 5\' 3"  (1.6 m)   Wt 173 lb (78.5 kg)   SpO2 96%   BMI 30.65 kg/m    Physical Exam Vitals and nursing note reviewed.  Constitutional:      General: She is not in acute distress.    Appearance: Normal appearance. She is not toxic-appearing.  HENT:     Head: Normocephalic and atraumatic.  Pulmonary:     Effort: Pulmonary effort is normal.  Musculoskeletal:     Comments: Non-pitting edema lower extremity. Tenderness lateral aspect left  foot along fifth metatarsal/malleolus.  Skin:    General: Skin is warm and dry.  Neurological:     Mental Status: She is alert and oriented to person, place, and time. Mental status is at baseline.  Psychiatric:        Mood and Affect: Mood normal.        Behavior: Behavior normal.        Thought Content: Thought content normal.        Judgment: Judgment normal.     Has intertrigo under right breast.  Results:   Studies obtained and personally reviewed by me:   Labs:       Component Value Date/Time   NA 144 09/25/2021 0927   K 4.7 09/25/2021 0927   CL 108 09/25/2021 0927   CO2 27 09/25/2021 0927   GLUCOSE 115 (H) 09/25/2021 0927   BUN 16 09/25/2021 0927   CREATININE 0.57 09/25/2021  0927   CALCIUM 9.5 09/25/2021 0927   PROT 6.8 03/14/2023 0910   ALBUMIN 3.9 01/17/2017 1023   AST 21 03/14/2023 0910   ALT 27 03/14/2023 0910   ALKPHOS 87 01/17/2017 1023   BILITOT 0.4 03/14/2023 0910   GFRNONAA 95 09/19/2020 1116   GFRAA 110 09/19/2020 1116     Lab Results  Component Value Date   WBC 5.0 09/25/2021   HGB 14.4 09/25/2021   HCT 41.8 09/25/2021   MCV 89.5 09/25/2021   PLT 224 09/25/2021    Lab Results  Component Value Date   CHOL 184 03/14/2023   HDL 54 03/14/2023   LDLCALC 104 (H) 03/14/2023   TRIG 147 03/14/2023   CHOLHDL 3.4 03/14/2023    Lab Results  Component Value Date   HGBA1C 6.1 (H) 03/14/2023     Lab Results  Component Value Date   TSH 2.43 09/25/2021      Assessment & Plan:   Left lower extremity pain involving foot and ankle:  May have had injury (Strain/Sprain) with pushing wheel chair for husband around quite a bit recently by herself  and/or osteoarthritis.   Plan: Today, Prescribed oxycodone-acetaminophen 5-325 mg every 6 hours as needed for pain. Ordered XR left foot , XR left ankle  to  see if pt has lower foot/ankle stress fracture or osteoarthritis.  Ordered uric acid, sed rate, CBC with Diff/Plat. Will contact pt with results. May need to see Orthopedist for evaluation.May need PT to help with pain and range of motion. May apply heat or ice which ever feels better.  Situational stress with husband- she is currently sole caregiver  Addendum: Xrays of foot and ankle show osteoarthritis.uric acid was normal.sed rate was 58 c/w inflammation, CBC with diff was normal.  Vaccine counseling: she will go to the Pharmacy for tetanus, Shingles vaccines.  Addendum: X-rays showed mild to moderate lateral and mild anterior and medial ankle soft tissue swelling.  No acute fracture of ankle or foot.  Moderate plantar and minimal posterior calcaneal heel spurs.  Minimal dorsal second tarsometatarsal degenerative spurring.  No fracture or  dislocation.  Cortices are intact.  Degenerative spurring along distal medial malleolus is minimal.  Ankle mortise is symmetric and intact.  Normal bone mineralization. May apply heat or ice to ankle and foot. Labs pending. May benefit from Meloxicam and possibly physical therapy.Await lab studies drawn today.  I,Alexander Ruley,acting as a Neurosurgeon for Margaree Mackintosh, MD.,have documented all relevant documentation on the behalf of Margaree Mackintosh, MD,as directed by  Margaree Mackintosh, MD while  in the presence of Margaree Mackintosh, MD.   I, Margaree Mackintosh, MD, have reviewed all documentation for this visit. The documentation on 10/17/23 for the exam, diagnosis, procedures, and orders are all accurate and complete.

## 2023-10-17 NOTE — Patient Instructions (Addendum)
X-rays show changes consistent with osteoarthritis.  No occult fracture is noted.  Lab studies are pending.  Have prescribed oxycodone as patient requested 5/325 to use sparingly ( # 15 tablets) for pain.  May benefit from physical therapy.  Consider Meloxicam depending on results of lab studies.  May want to see orthopedist.  Has been seen at [facility] previously.  Also has intertrigo under right breast. Have prescribed Mycolog cream to be applied under right breast twice daily. May need medication for glucose intolerance.This could be contributing to intertrigo.

## 2023-10-17 NOTE — Telephone Encounter (Signed)
Patient has a rash on her private area on her bikini line also her foot is swollen since Sunday and it won't go down. She would like to come in to be seen ok to book?

## 2023-10-17 NOTE — Telephone Encounter (Signed)
Scheduled at 230pm

## 2023-10-18 LAB — CBC WITH DIFFERENTIAL/PLATELET
Absolute Lymphocytes: 1784 {cells}/uL (ref 850–3900)
Absolute Monocytes: 984 {cells}/uL — ABNORMAL HIGH (ref 200–950)
Basophils Absolute: 56 {cells}/uL (ref 0–200)
Basophils Relative: 0.7 %
Eosinophils Absolute: 80 {cells}/uL (ref 15–500)
Eosinophils Relative: 1 %
HCT: 39.9 % (ref 35.0–45.0)
Hemoglobin: 12.8 g/dL (ref 11.7–15.5)
MCH: 27.6 pg (ref 27.0–33.0)
MCHC: 32.1 g/dL (ref 32.0–36.0)
MCV: 86 fL (ref 80.0–100.0)
MPV: 10.9 fL (ref 7.5–12.5)
Monocytes Relative: 12.3 %
Neutro Abs: 5096 {cells}/uL (ref 1500–7800)
Neutrophils Relative %: 63.7 %
Platelets: 359 10*3/uL (ref 140–400)
RBC: 4.64 10*6/uL (ref 3.80–5.10)
RDW: 12.2 % (ref 11.0–15.0)
Total Lymphocyte: 22.3 %
WBC: 8 10*3/uL (ref 3.8–10.8)

## 2023-10-18 LAB — SEDIMENTATION RATE: Sed Rate: 58 mm/h — ABNORMAL HIGH (ref 0–30)

## 2023-10-18 LAB — URIC ACID: Uric Acid, Serum: 3.9 mg/dL (ref 2.5–7.0)

## 2023-10-21 ENCOUNTER — Telehealth: Payer: Self-pay | Admitting: Internal Medicine

## 2023-10-21 NOTE — Telephone Encounter (Signed)
Called patient regarding follow up of acute left ankle and foot pain to see how she was doing. Unfortunately has not been able to pick up meds yet. Not having as much severe pain but still with symptoms. Thinks son can help her get to pharmacy today. Suggest she try medication prescribed and see how things. Go. Has osteoarthritis on foot and ankle Xrays. May need long term Meloxicam and/or Appt with Orthopedist if problem persists. Has considerable situational stress causing anxiety. MJB, MD

## 2023-10-22 ENCOUNTER — Telehealth: Payer: Self-pay | Admitting: Internal Medicine

## 2023-10-22 MED ORDER — MELOXICAM 15 MG PO TABS: 15.0000 mg | ORAL_TABLET | Freq: Every day | ORAL | 3 refills | Status: DC

## 2023-10-22 NOTE — Telephone Encounter (Signed)
It wouldn't let me sign it, I have pended it.

## 2023-10-22 NOTE — Telephone Encounter (Signed)
Blima Huitt [phone]  Giuseppa called to say, she thought you were going to also call in an antiflamatory medication for her, when she was here last week.

## 2023-10-22 NOTE — Telephone Encounter (Signed)
Patient will try course of meloxicam for osteoarthritis of foot and ankle. If not improving in 4-6 weeks may need to see orthopedist

## 2023-10-28 ENCOUNTER — Telehealth: Payer: Self-pay | Admitting: Internal Medicine

## 2023-10-28 NOTE — Telephone Encounter (Signed)
Received Surgery Clearance form from [facility] for Donna Spears. Surgery is schedule for 01/27/2024   Dr Homero Fellers Aluisio  Right total knee arthroplasty Anticipated [facility]  Office scheduler Aida Raider [phone]  Fax - [phone]  CPE labs scheduled 12/31/2023 CPE scheduled 01/02/2024

## 2023-10-28 NOTE — Telephone Encounter (Signed)
Should be able too!

## 2023-11-05 ENCOUNTER — Encounter: Payer: Self-pay | Admitting: Internal Medicine

## 2023-11-05 ENCOUNTER — Ambulatory Visit: Payer: Self-pay | Admitting: Internal Medicine

## 2023-11-05 ENCOUNTER — Ambulatory Visit (INDEPENDENT_AMBULATORY_CARE_PROVIDER_SITE_OTHER): Payer: Medicare Other | Admitting: Internal Medicine

## 2023-11-05 VITALS — BP 140/90 | HR 72 | Temp 98.9°F | Ht 63.0 in | Wt 179.0 lb

## 2023-11-05 DIAGNOSIS — M7989 Other specified soft tissue disorders: Secondary | ICD-10-CM | POA: Diagnosis not present

## 2023-11-05 DIAGNOSIS — M79662 Pain in left lower leg: Secondary | ICD-10-CM

## 2023-11-05 MED ORDER — FUROSEMIDE 20 MG PO TABS: 20.0000 mg | ORAL_TABLET | Freq: Every day | ORAL | 1 refills | Status: DC

## 2023-11-05 MED ORDER — PREDNISONE 10 MG PO TABS: ORAL_TABLET | ORAL | 1 refills | Status: DC

## 2023-11-05 NOTE — Telephone Encounter (Signed)
Copied from CRM [id number]. Topic: Clinical - Pink Word Triage >> Nov 05, 2023 11:50 AM Almira Coaster wrote: Reason for Triage: Patient called with sudden swelling in both feet, no pain.   Chief Complaint: BLE Pedal Edema Symptoms: Acute swelling in both ankles and feet, hard to walk.  Frequency: Acute Pertinent Negatives: Patient denies any associated symptoms Disposition: [] ED /[] Urgent Care (no appt availability in office) / [x] Appointment(In office/virtual)/ []  [facility]/ [] Home Care/ [] Refused Recommended Disposition /[] [facility]/ []  Follow-up with PCP Additional Notes: Patient appointment made to be seen today. Says the dose of Meloxicam is not really helping her.    Reason for Disposition  [1] MILD swelling of both ankles (i.e., pedal edema) AND [2] new-onset or worsening  Answer Assessment - Initial Assessment Questions 1. ONSET: "When did the swelling start?" (e.g., minutes, hours, days)     3 Weeks ago 2. LOCATION: "What part of the leg is swollen?"  "Are both legs swollen or just one leg?"     Both ankles and feet.   3. SEVERITY: "How bad is the swelling?" (e.g., localized; mild, moderate, severe)   - Localized: Small area of swelling localized to one leg.   - MILD pedal edema: Swelling limited to foot and ankle, pitting edema < 1/4 inch (6 mm) deep, rest and elevation eliminate most or all swelling.   - MODERATE edema: Swelling of lower leg to knee, pitting edema > 1/4 inch (6 mm) deep, rest and elevation only partially reduce swelling.   - SEVERE edema: Swelling extends above knee, facial or hand swelling present.      Mild 4. REDNESS: "Does the swelling look red or infected?"     No 5. PAIN: "Is the swelling painful to touch?" If Yes, ask: "How painful is it?"   (Scale 1-10; mild, moderate or severe)     0 6. FEVER: "Do you have a fever?" If Yes, ask: "What is it, how was it measured, and when did it start?"      No 7. CAUSE: "What do you think is  causing the leg swelling?"     Unsure 8. MEDICAL HISTORY: "Do you have a history of blood clots (e.g., DVT), cancer, heart failure, kidney disease, or liver failure?"     No 9. RECURRENT SYMPTOM: "Have you had leg swelling before?" If Yes, ask: "When was the last time?" "What happened that time?"     First time.  10. OTHER SYMPTOMS: "Do you have any other symptoms?" (e.g., chest pain, difficulty breathing)       No 11. PREGNANCY: "Is there any chance you are pregnant?" "When was your last menstrual period?"       No  Protocols used: Leg Swelling and Edema-A-AH

## 2023-11-05 NOTE — Progress Notes (Shared)
Patient Care Team: Margaree Mackintosh, MD as PCP - General (Internal Medicine)  Visit Date: 11/05/23  Subjective:    Patient ID: Donna Spears , Female   DOB: August 31, 1956, 67 y.o.    MRN: [id number]   67 y.o. Female presents today for left leg swelling/pain, right knee pain. Taking meloxicam 15 mg daily without relief. Pending right total knee arthroplasty 01/27/24 with Dr. Ollen Gross. 10/17/23 left foot/ankle X-ray showed: 1) mild to moderate lateral and mild anterior and medial ankle soft tissue swelling. No acute fracture, 2) moderate plantar and minimal posterior calcaneal heel spurs. She has been sedentary due to right knee pain.  Past Medical History:  Diagnosis Date   Allergy    Anemia    Asthma    Depression    GERD (gastroesophageal reflux disease)    Hyperlipidemia      Family History  Problem Relation Age of Onset   Heart disease Mother    Hypertension Mother    Hyperlipidemia Father    Heart disease Brother    Hypertension Brother     Social Hx: Married. Husband is disabled and she has to push him around in wheelchair.  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  She has a college degree and is married to a retired Runner, broadcasting/film/video.  She does not smoke or consume alcohol.  She has grandchildren.  2 adult children, a son and a daughter.  Family history: Mother with history of heart disease, lung cancer as well as colon cancer.  1 brother with history of coronary artery disease.  History of left carotid bruit and ultrasound in 2018 showed 1 to 39% stenosis.  Has impaired glucose tolerance treated with metformin.  Is on Effexor for depression symptoms.  Takes meloxicam for musculoskeletal pain and Levsin as needed for irritable bowel syndrome.  Is on atorvastatin 40 mg daily for hyperlipidemia and Xanax as needed for anxiety.  Is on Protonix for GE reflux.    Has been complaining of left leg pain and swelling. Was given Meloxicam in late  October but says it does not help the pain.Ankle and foot Xrays in late October showed plantar and minimal posterior calcaneal heel spurs. Had mild to moderate soft tissue swelling.  Hx of hyperlipidemia treated with statin, depression, and GERD. Hx irritable bowel syndrome.Hx allergic rhinitis and fibrocystic breast disease. Had trigger finger relase by Dr. Mina Marble in the remote past.Has left knee injected at [facility] for osteoarthritis symptoms.   Review of Systems  Constitutional:  Negative for fever and malaise/fatigue.  HENT:  Negative for congestion.   Eyes:  Negative for blurred vision.  Respiratory:  Negative for cough and shortness of breath.   Cardiovascular:  Positive for leg swelling (Bilateral LE). Negative for chest pain and palpitations.  Gastrointestinal:  Negative for vomiting.  Musculoskeletal:  Positive for joint pain (Right knee). Negative for back pain.  Skin:  Negative for rash.  Neurological:  Negative for loss of consciousness and headaches.        Objective:   Vitals: BP (!) 140/90   Pulse 72   Temp 98.9 F (37.2 C)   Ht 5\' 3"  (1.6 m)   Wt 179 lb (81.2 kg)   SpO2 98%   BMI 31.71 kg/m    Physical Exam Vitals and nursing note reviewed.  Constitutional:      General: She is not in acute distress.    Appearance: Normal appearance. She is  not toxic-appearing.  HENT:     Head: Normocephalic and atraumatic.  Pulmonary:     Effort: Pulmonary effort is normal.  Musculoskeletal:     Right lower leg: 1+ Pitting Edema present.     Left lower leg: 2+ Pitting Edema present.  Skin:    General: Skin is warm and dry.  Neurological:     Mental Status: She is alert and oriented to person, place, and time. Mental status is at baseline.  Psychiatric:        Mood and Affect: Mood normal.        Behavior: Behavior normal.        Thought Content: Thought content normal.        Judgment: Judgment normal.       Results:   Studies obtained  and personally reviewed by me:  10/17/23 left foot/ankle X-ray showed: 1) mild to moderate lateral and mild anterior and medial ankle soft tissue swelling. No acute fracture, 2) moderate plantar and minimal posterior calcaneal heel spurs.   Labs:       Component Value Date/Time   NA 144 09/25/2021 0927   K 4.7 09/25/2021 0927   CL 108 09/25/2021 0927   CO2 27 09/25/2021 0927   GLUCOSE 115 (H) 09/25/2021 0927   BUN 16 09/25/2021 0927   CREATININE 0.57 09/25/2021 0927   CALCIUM 9.5 09/25/2021 0927   PROT 6.8 03/14/2023 0910   ALBUMIN 3.9 01/17/2017 1023   AST 21 03/14/2023 0910   ALT 27 03/14/2023 0910   ALKPHOS 87 01/17/2017 1023   BILITOT 0.4 03/14/2023 0910   GFRNONAA 95 09/19/2020 1116   GFRAA 110 09/19/2020 1116     Lab Results  Component Value Date   WBC 8.0 10/17/2023   HGB 12.8 10/17/2023   HCT 39.9 10/17/2023   MCV 86.0 10/17/2023   PLT 359 10/17/2023    Lab Results  Component Value Date   CHOL 184 03/14/2023   HDL 54 03/14/2023   LDLCALC 104 (H) 03/14/2023   TRIG 147 03/14/2023   CHOLHDL 3.4 03/14/2023    Lab Results  Component Value Date   HGBA1C 6.1 (H) 03/14/2023     Lab Results  Component Value Date   TSH 2.43 09/25/2021      Assessment & Plan:   Right knee pain: Due to osteoarthritis right knee.  Stop meloxicam.  For left lower leg pain and right knee pain have prescribed prednisone tapering course 10 mg tablets take as directed.  She will start with 6 tablets day 1 and decrease by 1 tablet daily i.e. 6-5-4-3-2-1 taper.  She has pain medications that have been prescribed previously at home and should take those.  She should stop meloxicam while taking prednisone.  Lower extremity edema: (dependent)  Left lower leg pain: ordered vascular US left lower extremity.  Need to rule out DVT in this setting.  Vascular lab agreeable to scheduling this and study was ordered for tomorrow.  Encourage patient to take pain medication if needed that has been  previously prescribed.  Situational stress with husband who is disabled and this is resulting in anxiety and depression symptoms  Hold meloxicam while taking prednisone taper.  For dependent edema take Lasix 20 mg by mouth daily  Return on Thursday for CBC with Diff/Plat, sed rate, A1c tests.     I,Alexander Ruley,acting as a Neurosurgeon for Margaree Mackintosh, MD.,have documented all relevant documentation on the behalf of Margaree Mackintosh, MD,as directed by  Luanna Cole  Baxley, MD while in the presence of Margaree Mackintosh, MD.   I, Margaree Mackintosh, MD, have reviewed all documentation for this visit. The documentation on 11/06/23 for the exam, diagnosis, procedures, and orders are all accurate and complete.

## 2023-11-06 ENCOUNTER — Ambulatory Visit (HOSPITAL_COMMUNITY)
Admission: RE | Admit: 2023-11-06 | Discharge: 2023-11-06 | Disposition: A | Discharge status: Home / Self Care | Payer: Medicare Other | Source: Ambulatory Visit | Attending: Internal Medicine | Admitting: Internal Medicine

## 2023-11-06 DIAGNOSIS — M79662 Pain in left lower leg: Secondary | ICD-10-CM | POA: Diagnosis not present

## 2023-11-06 DIAGNOSIS — M7989 Other specified soft tissue disorders: Secondary | ICD-10-CM | POA: Insufficient documentation

## 2023-11-06 NOTE — Patient Instructions (Signed)
A vascular study has been ordered to rule out DVT left lower leg.  Please take pain medication if you are having severe pain in your extremities.  Lasix 20 mg daily has been ordered for dependent edema.  Please stop meloxicam and take prednisone taper as directed.

## 2023-11-06 NOTE — Progress Notes (Signed)
Lower extremity venous duplex completed. Please see CV Procedures for preliminary results.  Shona Simpson, RVT 11/06/23 3:13 PM

## 2023-11-07 ENCOUNTER — Other Ambulatory Visit: Payer: Medicare Other

## 2023-11-07 DIAGNOSIS — M170 Bilateral primary osteoarthritis of knee: Secondary | ICD-10-CM

## 2023-11-07 DIAGNOSIS — R5383 Other fatigue: Secondary | ICD-10-CM

## 2023-11-07 DIAGNOSIS — M7989 Other specified soft tissue disorders: Secondary | ICD-10-CM

## 2023-11-07 DIAGNOSIS — E7439 Other disorders of intestinal carbohydrate absorption: Secondary | ICD-10-CM

## 2023-11-08 LAB — CBC WITH DIFFERENTIAL/PLATELET
Absolute Lymphocytes: 1058 {cells}/uL (ref 850–3900)
Absolute Monocytes: 649 {cells}/uL (ref 200–950)
Basophils Absolute: 38 {cells}/uL (ref 0–200)
Basophils Relative: 0.6 %
Eosinophils Absolute: 88 {cells}/uL (ref 15–500)
Eosinophils Relative: 1.4 %
HCT: 35.5 % (ref 35.0–45.0)
Hemoglobin: 11.2 g/dL — ABNORMAL LOW (ref 11.7–15.5)
MCH: 27.3 pg (ref 27.0–33.0)
MCHC: 31.5 g/dL — ABNORMAL LOW (ref 32.0–36.0)
MCV: 86.4 fL (ref 80.0–100.0)
MPV: 11.2 fL (ref 7.5–12.5)
Monocytes Relative: 10.3 %
Neutro Abs: 4467 {cells}/uL (ref 1500–7800)
Neutrophils Relative %: 70.9 %
Platelets: 355 10*3/uL (ref 140–400)
RBC: 4.11 10*6/uL (ref 3.80–5.10)
RDW: 12 % (ref 11.0–15.0)
Total Lymphocyte: 16.8 %
WBC: 6.3 10*3/uL (ref 3.8–10.8)

## 2023-11-08 LAB — BASIC METABOLIC PANEL
BUN/Creatinine Ratio: 41 (calc) — ABNORMAL HIGH (ref 6–22)
BUN: 16 mg/dL (ref 7–25)
CO2: 29 mmol/L (ref 20–32)
Calcium: 8.8 mg/dL (ref 8.6–10.4)
Chloride: 106 mmol/L (ref 98–110)
Creat: 0.39 mg/dL — ABNORMAL LOW (ref 0.50–1.05)
Glucose, Bld: 129 mg/dL — ABNORMAL HIGH (ref 65–99)
Potassium: 4.6 mmol/L (ref 3.5–5.3)
Sodium: 142 mmol/L (ref 135–146)

## 2023-11-08 LAB — MICROALBUMIN / CREATININE URINE RATIO
Creatinine, Urine: 159 mg/dL (ref 20–275)
Microalb Creat Ratio: 5 mg/g{creat} (ref <30)
Microalb, Ur: 0.8 mg/dL

## 2023-11-08 LAB — SEDIMENTATION RATE: Sed Rate: 62 mm/h — ABNORMAL HIGH (ref 0–30)

## 2023-11-08 LAB — HEMOGLOBIN A1C
Hgb A1c MFr Bld: 6.8 %{Hb} — ABNORMAL HIGH (ref <5.7)
Mean Plasma Glucose: 148 mg/dL
eAG (mmol/L): 8.2 mmol/L

## 2023-11-08 LAB — TSH: TSH: 1.64 m[IU]/L (ref 0.40–4.50)

## 2023-11-28 ENCOUNTER — Other Ambulatory Visit: Payer: Self-pay | Admitting: Internal Medicine

## 2023-11-29 ENCOUNTER — Telehealth: Payer: Self-pay

## 2023-11-29 NOTE — Telephone Encounter (Signed)
scheduled

## 2023-11-29 NOTE — Telephone Encounter (Signed)
Copied from CRM [id number]. Topic: General - Other >> Nov 29, 2023  9:08 AM Donna Spears wrote: Reason for CRM: pt would like for nurse to call her regarding her feet being swollen   Patient states her feet are still swollen they got better but the swelling really never went away. She has trouble walking. She went to see ortho for the knee replacement that she is going to be having in February and  they told her that she needs to get the swelling under control before they can operate. She wants to know what to do?

## 2023-12-02 ENCOUNTER — Encounter: Payer: Self-pay | Admitting: Internal Medicine

## 2023-12-02 ENCOUNTER — Ambulatory Visit (INDEPENDENT_AMBULATORY_CARE_PROVIDER_SITE_OTHER): Payer: Medicare Other | Admitting: Internal Medicine

## 2023-12-02 VITALS — BP 140/90 | HR 96 | Temp 100.5°F | Ht 63.0 in | Wt 175.0 lb

## 2023-12-02 DIAGNOSIS — E119 Type 2 diabetes mellitus without complications: Secondary | ICD-10-CM | POA: Diagnosis not present

## 2023-12-02 MED ORDER — FUROSEMIDE 40 MG PO TABS: 40.0000 mg | ORAL_TABLET | Freq: Every day | ORAL | 3 refills | Status: DC

## 2023-12-02 NOTE — Progress Notes (Signed)
Patient Care Team: Margaree Mackintosh, MD as PCP - General (Internal Medicine)  Visit Date: 12/02/23  Subjective:    Patient ID: Donna Spears , Female   DOB: [birth year], 67 y.o.    MRN: [id number]   67 y.o. Female presents today for swelling in feet, A1c check.  History of impaired glucose tolerance treated with metformin 500 mg twice daily with meals. She has not been taking this recently.  Hemoglobin low at 11.2 on 11/07/23. She is taking a women's iron supplement.  She is compliant with cholesterol medication.  History of dependent edema treated with furosemide 20 mg daily. She has had increased lower leg swelling recently.  Pending right total knee arthroplasty with Dr. Lequita Halt on 01/27/24. She is not taking anything for pain.  Past Medical History:  Diagnosis Date   Allergy    Anemia    Asthma    Depression    GERD (gastroesophageal reflux disease)    Hyperlipidemia      Family History  Problem Relation Age of Onset   Heart disease Mother    Hypertension Mother    Hyperlipidemia Father    Heart disease Brother    Hypertension Brother     Social Hx: husband is disabled. There is situational stress. Has no relef from constant care giving.     Review of Systems  Constitutional:  Negative for fever and malaise/fatigue.  HENT:  Negative for congestion.   Eyes:  Negative for blurred vision.  Respiratory:  Negative for cough and shortness of breath.   Cardiovascular:  Positive for leg swelling. Negative for chest pain and palpitations.  Gastrointestinal:  Negative for vomiting.  Musculoskeletal:  Positive for joint pain (Right knee). Negative for back pain.  Skin:  Negative for rash.  Neurological:  Negative for loss of consciousness and headaches.        Objective:   Vitals: BP (!) 140/90   Pulse 96   Temp (!) 100.5 F (38.1 C)   Ht 5\' 3"  (1.6 m)   Wt 175 lb (79.4 kg)   SpO2 98%   BMI 31.00 kg/m    Physical Exam Vitals and nursing note reviewed.   Constitutional:      General: She is not in acute distress.    Appearance: Normal appearance. She is not toxic-appearing.  HENT:     Head: Normocephalic and atraumatic.  Pulmonary:     Effort: Pulmonary effort is normal.  Skin:    General: Skin is warm and dry.  Neurological:     Mental Status: She is alert and oriented to person, place, and time. Mental status is at baseline.  Psychiatric:        Mood and Affect: Mood normal.        Behavior: Behavior normal.        Thought Content: Thought content normal.        Judgment: Judgment normal.       Results:   Studies obtained and personally reviewed by me:   Labs:       Component Value Date/Time   NA 142 11/07/2023 1012   K 4.6 11/07/2023 1012   CL 106 11/07/2023 1012   CO2 29 11/07/2023 1012   GLUCOSE 129 (H) 11/07/2023 1012   BUN 16 11/07/2023 1012   CREATININE 0.39 (L) 11/07/2023 1012   CALCIUM 8.8 11/07/2023 1012   PROT 6.8 03/14/2023 0910   ALBUMIN 3.9 01/17/2017 1023   AST 21 03/14/2023 0910   ALT 27 03/14/2023  0910   ALKPHOS 87 01/17/2017 1023   BILITOT 0.4 03/14/2023 0910   GFRNONAA 95 09/19/2020 1116   GFRAA 110 09/19/2020 1116     Lab Results  Component Value Date   WBC 6.3 11/07/2023   HGB 11.2 (L) 11/07/2023   HCT 35.5 11/07/2023   MCV 86.4 11/07/2023   PLT 355 11/07/2023    Lab Results  Component Value Date   CHOL 184 03/14/2023   HDL 54 03/14/2023   LDLCALC 104 (H) 03/14/2023   TRIG 147 03/14/2023   CHOLHDL 3.4 03/14/2023    Lab Results  Component Value Date   HGBA1C 6.8 (H) 11/07/2023     Lab Results  Component Value Date   TSH 1.64 11/07/2023      Assessment & Plan:   Impaired glucose tolerance: she will restart metformin 500 mg twice daily with meals.   Mild Anemia: hemoglobin low at 11.2 on 11/07/23. Instructed on supplementing iron-rich foods in diet.   Dependent edema: increase furosemide to 40 mg daily.  Ordered CMET. Referral placed for Cardiology.  Return in  10 days for EKG, blood work.    I,Alexander Ruley,acting as a Neurosurgeon for Margaree Mackintosh, MD.,have documented all relevant documentation on the behalf of Margaree Mackintosh, MD,as directed by  Margaree Mackintosh, MD while in the presence of Margaree Mackintosh, MD.   I, Margaree Mackintosh, MD, have reviewed all documentation for this visit. The documentation on 12/09/23 for the exam, diagnosis, procedures, and orders are all accurate and complete.

## 2023-12-09 ENCOUNTER — Encounter: Payer: Self-pay | Admitting: Internal Medicine

## 2023-12-09 NOTE — Patient Instructions (Addendum)
Patient has been started on Lasix  and dose increased to 40 mg daily. Also, have  asked her  to restart metformin. Medical issues need optimization. Refer to cardiology for evaluation. She lives with disabled husband. Will need home health support after surgery and husband may need additional care giving as well. Suggest [facility] nurse evaluate home situation.

## 2023-12-10 ENCOUNTER — Ambulatory Visit: Payer: Medicare Other

## 2023-12-10 ENCOUNTER — Telehealth: Payer: Self-pay

## 2023-12-10 NOTE — Telephone Encounter (Signed)
I called patient at 4:20 and she did not answer phone, I LVM that this was the same as a no show because it was a schedule appointment and to please call back.

## 2023-12-10 NOTE — Telephone Encounter (Signed)
Called patient to complete medicare wellness questionnaire as scheduled today at 3:30pm and she stated she was at [facility] and preferred to call back when she was at home available.

## 2023-12-10 NOTE — Telephone Encounter (Signed)
I called again around 4:45 to see if I could get her and to see if we could possible complete the AMW visit, no answer.

## 2023-12-10 NOTE — Progress Notes (Signed)
Referral placed.

## 2023-12-11 ENCOUNTER — Other Ambulatory Visit: Payer: Self-pay | Admitting: Internal Medicine

## 2023-12-11 NOTE — Progress Notes (Signed)
Patient Care Team: Margaree Mackintosh, MD as PCP - General (Internal Medicine)  Visit Date: 12/13/23  Subjective:    Patient ID: Donna Spears , Female   DOB: [birth year], 67 y.o.    MRN: [id number]   67 y.o. Female presents today for pre-operative evaluation. She is scheduled for right total knee arthroplasty by Dr. Lequita Halt on 01/27/24. Currently taking meloxicam 15 mg daily. Leg and knee pain improved somewhat. Still some issues with dependent edema despite low dose Lasix.  She had sed rate of 58 in October and 62 in November.In mid November was seen for pain and swelling of left lower leg. She had calcaneal heel spurs on Xray, moderate soft tissue swelling of ankle. Could not get her shoe on. Was treated with low dose Lasix and meloxicam and improved.Had been rolling her disabled husband in his wheelchair and could have injured her foot.  Has had post-nasal drip recently that irritates throat.  History of impaired glucose tolerance treated with metformin 500 mg twice daily with meals.    Hemoglobin low at 11.2 on 11/07/23. She is taking a women's iron supplement.   History of hyperlipidemia treated with Lipitor 40 mg daily.    History of dependent edema treated with furosemide 40 mg daily. She is still having swelling in feet/ankles and hands.  Takes Protonix for GE reflux.   Had colonoscopy on 08/22/23. Showed hemorrhoids on perianal exam, one polyp in sigmoid colon. Pathology showed tubular adenoma.   Dr. Huel Cote is GYN physician.   Dr. Ewing Schlein is her gastroenterologist.   No known drug allergies.   History of allergic rhinitis and fibrocystic breast disease.  Had right trigger finger release by Dr. Mina Marble in the remote past.   She had a dog bite to her right hand during the summer 2015.  Rabies immunization had expired on the dog at the time.  We checked with an infectious disease consultant and it was felt that rabies immunization for the patient was not necessary as the  expiration date on the dog's rabies vaccine was only a couple of months before the bite.  History of left carotid bruit and ultrasound 2018 showed 1 to 39 percent stenosis.   Social history: She formerly worked as Financial controller for [employer].  She is retired.  She has a college degree and is married to a retired Runner, broadcasting/film/video.  Does not smoke or consume alcohol.  She has grandchildren.  She tries to walk several miles weekly.  2 adult children, son and a daughter.   Family history: Mother with history of heart disease, lung cancer as well as colon cancer.  1 brother with history of coronary artery disease.  Past Medical History:  Diagnosis Date   Allergy    Anemia    Asthma    Depression    GERD (gastroesophageal reflux disease)    Hyperlipidemia      Family History  Problem Relation Age of Onset   Heart disease Mother    Hypertension Mother    Hyperlipidemia Father    Heart disease Brother    Hypertension Brother     Social history: Married with 2 adult children.  Husband is disabled.  He is a retired Runner, broadcasting/film/video.  Patient formally worked for [employer].  She is retired.     Review of Systems  Constitutional:  Negative for fever and malaise/fatigue.  HENT:  Negative for congestion.        (+) Post-nasal drip  Eyes:  Negative  for blurred vision.  Respiratory:  Negative for cough and shortness of breath.   Cardiovascular:  Positive for leg swelling. Negative for chest pain and palpitations.  Gastrointestinal:  Negative for vomiting.  Musculoskeletal:  Negative for back pain.  Skin:  Negative for rash.  Neurological:  Negative for loss of consciousness and headaches.        Objective:   Vitals: BP 130/80   Pulse 92   Ht 5\' 3"  (1.6 m)   Wt 173 lb (78.5 kg)   SpO2 96%   BMI 30.65 kg/m    Physical Exam Vitals and nursing note reviewed.  Constitutional:      General: She is not in acute distress.    Appearance: Normal appearance. She is not  toxic-appearing.  HENT:     Head: Normocephalic and atraumatic.     Mouth/Throat:     Comments: Pharynx slightly injected. Neck:     Thyroid: No thyroid mass, thyromegaly or thyroid tenderness.     Vascular: No carotid bruit.     Comments: Small anterior cervical nodes. Cardiovascular:     Rate and Rhythm: Normal rate and regular rhythm.     Pulses:          Dorsalis pedis pulses are 1+ on the right side and 1+ on the left side.     Heart sounds: Normal heart sounds. No murmur heard.    No friction rub. No gallop.     Comments: Occasional extrasystole. Pulmonary:     Effort: Pulmonary effort is normal. No respiratory distress.     Breath sounds: Normal breath sounds. No wheezing or rales.  Abdominal:     General: There is no distension.     Palpations: Abdomen is soft. There is no hepatomegaly, splenomegaly or mass.     Tenderness: There is no abdominal tenderness.  Genitourinary:    Comments: NFEG. Bimanual exam normal. Musculoskeletal:     Right lower leg: 1+ Pitting Edema present.     Left lower leg: 1+ Pitting Edema present.     Comments: Right knee reduced ROM.  Lymphadenopathy:     Cervical: No cervical adenopathy.     Upper Body:     Right upper body: No supraclavicular adenopathy.     Left upper body: No supraclavicular adenopathy.  Skin:    General: Skin is warm and dry.  Neurological:     Mental Status: She is alert and oriented to person, place, and time. Mental status is at baseline.  Psychiatric:        Mood and Affect: Mood normal.        Behavior: Behavior normal.        Thought Content: Thought content normal.        Judgment: Judgment normal.       Results:   Studies obtained and personally reviewed by me:  Had colonoscopy on 08/22/23. Showed hemorrhoids on perianal exam, one polyp in sigmoid colon. Pathology showed tubular adenoma.   Labs:       Component Value Date/Time   NA 142 11/07/2023 1012   K 4.6 11/07/2023 1012   CL 106 11/07/2023  1012   CO2 29 11/07/2023 1012   GLUCOSE 129 (H) 11/07/2023 1012   BUN 16 11/07/2023 1012   CREATININE 0.39 (L) 11/07/2023 1012   CALCIUM 8.8 11/07/2023 1012   PROT 6.8 03/14/2023 0910   ALBUMIN 3.9 01/17/2017 1023   AST 21 03/14/2023 0910   ALT 27 03/14/2023 0910   ALKPHOS 87 01/17/2017  1023   BILITOT 0.4 03/14/2023 0910   GFRNONAA 95 09/19/2020 1116   GFRAA 110 09/19/2020 1116     Lab Results  Component Value Date   WBC 6.3 11/07/2023   HGB 11.2 (L) 11/07/2023   HCT 35.5 11/07/2023   MCV 86.4 11/07/2023   PLT 355 11/07/2023    Lab Results  Component Value Date   CHOL 184 03/14/2023   HDL 54 03/14/2023   LDLCALC 104 (H) 03/14/2023   TRIG 147 03/14/2023   CHOLHDL 3.4 03/14/2023    Lab Results  Component Value Date   HGBA1C 6.8 (H) 11/07/2023     Lab Results  Component Value Date   TSH 1.64 11/07/2023      Assessment & Plan:   Impaired glucose tolerance: treated with metformin 500 mg twice daily with meals.   Hyperlipidemia: treated with Lipitor 40 mg daily.    Dependent edema: treated with furosemide 40 mg daily.   Takes Protonix for GE reflux.   Had colonoscopy on 08/22/23. Showed hemorrhoids on perianal exam, one polyp in sigmoid colon. Pathology showed tubular adenoma.   Dr. Huel Cote is GYN physician.   Dr. Ewing Schlein is her gastroenterologist.  We were unable to obtain an EKG today due to equipment issues.  Ordered sed rate, ANA, CMET, cyclic citrul peptide antibody, IgG, rheumatoid factor, uric acid.  She will be going to cardiology for cardiac clearance regarding lower extremity edema.    I,Alexander Ruley,acting as a Neurosurgeon for Margaree Mackintosh, MD.,have documented all relevant documentation on the behalf of Margaree Mackintosh, MD,as directed by  Margaree Mackintosh, MD while in the presence of Margaree Mackintosh, MD.   I, Margaree Mackintosh, MD, have reviewed all documentation for this visit. The documentation on 12/23/23 for the exam, diagnosis, procedures,  and orders are all accurate and complete.

## 2023-12-12 ENCOUNTER — Encounter: Payer: Self-pay | Admitting: Physical Therapy

## 2023-12-12 ENCOUNTER — Ambulatory Visit: Payer: Medicare Other | Attending: Orthopedic Surgery | Admitting: Physical Therapy

## 2023-12-12 DIAGNOSIS — R278 Other lack of coordination: Secondary | ICD-10-CM | POA: Insufficient documentation

## 2023-12-12 DIAGNOSIS — R2681 Unsteadiness on feet: Secondary | ICD-10-CM | POA: Insufficient documentation

## 2023-12-12 DIAGNOSIS — M170 Bilateral primary osteoarthritis of knee: Secondary | ICD-10-CM | POA: Insufficient documentation

## 2023-12-12 DIAGNOSIS — M6281 Muscle weakness (generalized): Secondary | ICD-10-CM | POA: Diagnosis present

## 2023-12-12 DIAGNOSIS — R262 Difficulty in walking, not elsewhere classified: Secondary | ICD-10-CM | POA: Diagnosis present

## 2023-12-12 NOTE — Therapy (Signed)
OUTPATIENT PHYSICAL THERAPY LOWER EXTREMITY EVALUATION   Patient Name: Donna Spears MRN: [id number] DOB:August 04, 1956, 67 y.o., female Today's Date: 12/12/2023  END OF SESSION:  PT End of Session - 12/12/23 1359     Visit Number 1    Date for PT Re-Evaluation 02/06/24    PT Start Time 1315    PT Stop Time 1350    PT Time Calculation (min) 35 min    Activity Tolerance Patient limited by pain    Behavior During Therapy [facility] for tasks assessed/performed             Past Medical History:  Diagnosis Date   Allergy    Anemia    Asthma    Depression    GERD (gastroesophageal reflux disease)    Hyperlipidemia    History reviewed. No pertinent surgical history. Patient Active Problem List   Diagnosis Date Noted   Back pain 11/17/2021   Pain in left knee 03/05/2018   Obesity, unspecified 10/01/2013   Allergic rhinitis 10/01/2013   GE reflux 08/12/2012   Depression 08/12/2012   Hyperlipidemia, mixed 08/12/2012    PCP: Margaree Mackintosh, MD   REFERRING PROVIDER: Ollen Gross, MD   REFERRING DIAG: M17.0 (ICD-10-CM) - Primary osteoarthritis of both knees   THERAPY DIAG:  Primary osteoarthritis of both knees  Other lack of coordination  Muscle weakness (generalized)  Difficulty in walking, not elsewhere classified  Unsteadiness on feet  Rationale for Evaluation and Treatment: Rehabilitation  ONSET DATE: 12/09/23  SUBJECTIVE:   SUBJECTIVE STATEMENT: Patient reports that she has started taking Lasix due to her increased swelling. She arrives for PT to increase her leg strength in preparation for R TKR in Feb. Her husband is disabled, has been seen here before for therapy.  PERTINENT HISTORY: Per referring physician note: Dr reports bone on bone OA of R knee, B foot and lower leg swelling. She has developed weakness due to pain, ROM 0-125 with crepitus. TKR planned for Feb and patient referred to primary Dr for the swelling issue. Pre-op PT for general  strengthening in preparation for TKR in Feb. Husband disabled and cannot walk PAIN:  Are you having pain? Yes: NPRS scale: 4/10 Pain location: both knees Pain description: burning Aggravating factors: Any movement, walking Relieving factors: nothing is effective  PRECAUTIONS: Knee and Fall  RED FLAGS: None   WEIGHT BEARING RESTRICTIONS: No  FALLS:  Has patient fallen in last 6 months? No  LIVING ENVIRONMENT: Lives with: lives with their family Lives in: House/apartment Stairs: No Has following equipment at home: None  OCCUPATION: not working. Likes to garden, does lawn care, all indoor housework.  PLOF: Independent  PATIENT GOALS: patient would like to get ready for surgery  NEXT MD VISIT: Mid January  OBJECTIVE:  Note: Objective measures were completed at Evaluation unless otherwise noted.  DIAGNOSTIC FINDINGS:  Per report X rays reveal bone on bone in R knee.  COGNITION: Overall cognitive status: Within functional limits for tasks assessed     SENSATION: Not tested  EDEMA:  B Lower leg edema, patient has started Lasix  MUSCLE LENGTH: Hamstrings: WNL B   POSTURE: rounded shoulders and left pelvic obliquity  PALPATION: B knees edemetous, TTP  LOWER EXTREMITY ROM: WNL except as noted  Active ROM Right eval Left eval  Hip flexion    Hip extension    Hip abduction    Hip adduction    Hip internal rotation    Hip external rotation    Knee flexion  113 118  Knee extension -20 -5  Ankle dorsiflexion    Ankle plantarflexion    Ankle inversion    Ankle eversion     (Blank rows = not tested)  LOWER EXTREMITY MMT: R LE 4+/5 MMT, functional weakness noted with difficulty in sit to stand and gait  FUNCTIONAL TESTS:  30 seconds chair stand test Timed up and go (TUG): 15.3 sec, stiff legs, increased lat sway to advance each leg 2 minute walk test: 200' 5x STS from elevated surface 17.2, used hands. Could not perform from regular  height.  GAIT: Distance walked: I Assistive device utilized: None Level of assistance: Modified independence Comments: Upon entering gym, patient had great difficulty walking, with increased lateral trunk sway, antalgic steps B, slow and very effortful, unsteady. This did improve somewhat after assessment which involved joint movement and muscle activation, but still impaired.   TODAY'S TREATMENT:                                                                                                                              DATE:  12/12/23 Education    PATIENT EDUCATION:  Education details: POC Person educated: Patient Education method: Explanation Education comprehension: verbalized understanding  HOME EXERCISE PROGRAM: Has general TKR exercise program from Dr. Catalina Pizza to continue this until next appointment and will update.  ASSESSMENT:  CLINICAL IMPRESSION: Patient is a 67 y.o. who was seen today for physical therapy evaluation and treatment for end stage OA of R knee. TKR scheduled for Feb. Referred today for pre-op strengthening. She demonstrates BLE weakness and knee pain, with stiffness, impaired balance, impaired safety and I with all functional mobility. She reports severe limitations in her normal daily activities. She is aware that PT will not eliminate all pain, but she would greatly benefit from PT to maximize her ROM and strength in not only her B knees, but ankles, hips, and trunk as well. She will benefit from Upper body strengthening as well in preparation for using a RW after surgery.   OBJECTIVE IMPAIRMENTS: Abnormal gait, decreased activity tolerance, decreased balance, decreased coordination, decreased endurance, difficulty walking, decreased ROM, decreased strength, increased edema, impaired flexibility, and pain.   ACTIVITY LIMITATIONS: bending, sitting, standing, squatting, stairs, transfers, and locomotion level  PARTICIPATION LIMITATIONS: meal prep, cleaning,  laundry, shopping, and community activity  PERSONAL FACTORS: Past/current experiences are also affecting patient's functional outcome.   REHAB POTENTIAL: Good  CLINICAL DECISION MAKING: Stable/uncomplicated  EVALUATION COMPLEXITY: Low   GOALS: Goals reviewed with patient? Yes  SHORT TERM GOALS: Target date: 12/29/23 I with initial HEP Baseline: Goal status: INITIAL  LONG TERM GOALS: Target date: 02/06/24  I with final HEP Baseline:  Goal status: INITIAL  2.  5 x STS in < 12 sec, stable, no UE support Baseline:  Goal status: INITIAL  3.  TUG in < 12 sec, no unsteadiness Baseline:  Goal status: INITIAL  4.  Ambulate x at least 300' in  2 min walk test, no gait deviations or antalgic pattern. Baseline:  Goal status: INITIAL  PLAN:  PT FREQUENCY: 2x/week  PT DURATION: 10 weeks  PLANNED INTERVENTIONS: 97110-Therapeutic exercises, 97530- Therapeutic activity, [id number]- Neuromuscular re-education, 97535- Self Care, [id number]- Manual therapy, 97116- Gait training, 97014- Electrical stimulation (unattended), 97016- Vasopneumatic device, Patient/Family education, Balance training, Stair training, Joint mobilization, Cryotherapy, and Moist heat  PLAN FOR NEXT SESSION: Update HEP, acute pain relief techniques.   Iona Beard, DPT 12/12/2023, 3:27 PM

## 2023-12-13 ENCOUNTER — Encounter: Payer: Self-pay | Admitting: Internal Medicine

## 2023-12-13 ENCOUNTER — Ambulatory Visit (INDEPENDENT_AMBULATORY_CARE_PROVIDER_SITE_OTHER): Payer: Medicare Other | Admitting: Internal Medicine

## 2023-12-13 VITALS — BP 130/80 | HR 92 | Ht 63.0 in | Wt 173.0 lb

## 2023-12-13 DIAGNOSIS — E782 Mixed hyperlipidemia: Secondary | ICD-10-CM

## 2023-12-13 DIAGNOSIS — Z0000 Encounter for general adult medical examination without abnormal findings: Secondary | ICD-10-CM | POA: Diagnosis not present

## 2023-12-13 DIAGNOSIS — E119 Type 2 diabetes mellitus without complications: Secondary | ICD-10-CM | POA: Diagnosis not present

## 2023-12-13 DIAGNOSIS — M7989 Other specified soft tissue disorders: Secondary | ICD-10-CM

## 2023-12-13 DIAGNOSIS — Z8249 Family history of ischemic heart disease and other diseases of the circulatory system: Secondary | ICD-10-CM

## 2023-12-13 DIAGNOSIS — K219 Gastro-esophageal reflux disease without esophagitis: Secondary | ICD-10-CM

## 2023-12-13 DIAGNOSIS — Z8659 Personal history of other mental and behavioral disorders: Secondary | ICD-10-CM

## 2023-12-13 DIAGNOSIS — F439 Reaction to severe stress, unspecified: Secondary | ICD-10-CM | POA: Diagnosis not present

## 2023-12-13 DIAGNOSIS — Z01818 Encounter for other preprocedural examination: Secondary | ICD-10-CM

## 2023-12-13 DIAGNOSIS — M5416 Radiculopathy, lumbar region: Secondary | ICD-10-CM

## 2023-12-13 DIAGNOSIS — M79662 Pain in left lower leg: Secondary | ICD-10-CM | POA: Diagnosis not present

## 2023-12-13 DIAGNOSIS — E7439 Other disorders of intestinal carbohydrate absorption: Secondary | ICD-10-CM

## 2023-12-13 NOTE — Progress Notes (Signed)
Subjective:   Donna Spears is a 67 y.o. female who presents for Medicare Annual (Subsequent) preventive examination.  Visit Complete: In person  Patient Medicare AWV questionnaire was completed by the patient on 12/13/23; I have confirmed that all information answered by patient is correct and no changes since this date.  Cardiac Risk Factors include: advanced age (>54men, >72 women);dyslipidemia     Objective:    Today's Vitals   12/13/23 1015 12/13/23 1020  BP:  130/80  Pulse: 92 92  SpO2: 96% 96%  Weight: 173 lb (78.5 kg) 173 lb (78.5 kg)  Height: 5\' 3"  (1.6 m) 5\' 3"  (1.6 m)  PainSc:  4   PainLoc:  Generalized   Body mass index is 30.65 kg/m.     12/13/2023   10:22 AM 12/05/2022    4:07 PM  Advanced Directives  Does Patient Have a Medical Advance Directive? No Yes  Type of Special educational needs teacher of [place];Living will  Does patient want to make changes to medical advance directive?  No - Patient declined  Copy of Healthcare Power of Attorney in Chart?  No - copy requested    Current Medications (verified) Outpatient Encounter Medications as of 12/13/2023  Medication Sig   atorvastatin (LIPITOR) 40 MG tablet TAKE 1 TABLET BY MOUTH DAILY   clobetasol cream (TEMOVATE) 0.05 % Apply 1 application topically 2 (two) times daily. (Patient taking differently: Apply 1 application  topically as needed.)   clotrimazole (LOTRIMIN) 1 % external solution Apply 1 application topically 2 (two) times daily. In between toes   fluticasone (FLONASE) 50 MCG/ACT nasal spray USE 1 SPRAY IN BOTH NOSTRILS  TWICE DAILY   fluticasone-salmeterol (ADVAIR) 250-50 MCG/ACT AEPB USE 1 INHALATION BY MOUTH EVERY  12 HOURS   furosemide (LASIX) 40 MG tablet TAKE 1 TABLET BY MOUTH EVERY DAY   Hyoscyamine Sulfate SL (LEVSIN/SL) 0.125 MG SUBL One sublingual one half hour before meals (Patient taking differently: as needed. One sublingual one half hour before meals)   meloxicam (MOBIC) 15 MG  tablet Take 1 tablet (15 mg total) by mouth daily.   metFORMIN (GLUCOPHAGE) 500 MG tablet TAKE 1 TABLET BY MOUTH TWICE  DAILY WITH MEALS   Multiple Vitamins-Minerals (ALIVE WOMENS 50+ PO) Take by mouth.   nystatin-triamcinolone (MYCOLOG II) cream Apply 1 Application topically 2 (two) times daily.   pantoprazole (PROTONIX) 40 MG tablet TAKE 1 TABLET BY MOUTH DAILY   venlafaxine (EFFEXOR) 75 MG tablet TAKE 1 TABLET BY MOUTH DAILY   [DISCONTINUED] predniSONE (DELTASONE) 10 MG tablet Take in tapering course as directed 6-5-4-3-2-1   [DISCONTINUED] ALPRAZolam (XANAX) 0.5 MG tablet Take 1 tablet (0.5 mg total) by mouth at bedtime.   [DISCONTINUED] mometasone-formoterol (DULERA) 200-5 MCG/ACT AERO Inhale 2 puffs into the lungs 2 (two) times daily.   No facility-administered encounter medications on file as of 12/13/2023.    Allergies (verified) Azithromycin, Erythromycin, and Other   History: Past Medical History:  Diagnosis Date   Allergy    Anemia    Asthma    Depression    GERD (gastroesophageal reflux disease)    Hyperlipidemia    No past surgical history on file. Family History  Problem Relation Age of Onset   Heart disease Mother    Hypertension Mother    Hyperlipidemia Father    Heart disease Brother    Hypertension Brother    Social History   Socioeconomic History   Marital status: Married    Spouse name: Not on file  Number of children: Not on file   Years of education: Not on file   Highest education level: Not on file  Occupational History   Not on file  Tobacco Use   Smoking status: Never   Smokeless tobacco: Never  Substance and Sexual Activity   Alcohol use: No   Drug use: No   Sexual activity: Yes    Birth control/protection: None  Other Topics Concern   Not on file  Social History Narrative   Not on file   Social Drivers of Health   Financial Resource Strain: Low Risk  (12/05/2022)   Overall Financial Resource Strain (CARDIA)    Difficulty of  Paying Living Expenses: Not hard at all  Food Insecurity: No Food Insecurity (12/05/2022)   Hunger Vital Sign    Worried About Running Out of Food in the Last Year: Never true    Ran Out of Food in the Last Year: Never true  Transportation Needs: No Transportation Needs (12/05/2022)   PRAPARE - Administrator, Civil Service (Medical): No    Lack of Transportation (Non-Medical): No  Physical Activity: Insufficiently Active (12/05/2022)   Exercise Vital Sign    Days of Exercise per Week: 2 days    Minutes of Exercise per Session: 30 min  Stress: No Stress Concern Present (12/05/2022)   [employer] of Occupational Health - Occupational Stress Questionnaire    Feeling of Stress : Not at all  Social Connections: Moderately Integrated (12/05/2022)   Social Connection and Isolation Panel [NHANES]    Frequency of Communication with Friends and Family: More than three times a week    Frequency of Social Gatherings with Friends and Family: More than three times a week    Attends Religious Services: More than 4 times per year    Active Member of [employer] or Organizations: No    Attends Engineer, structural: Never    Marital Status: Married    Tobacco Counseling Counseling given: Not Answered   Clinical Intake:  Pre-visit preparation completed: Yes  Pain : 0-10 Pain Score: 4  Pain Location: Arm     BMI - recorded: 30.65 Nutritional Status: BMI > 30  Obese Nutritional Risks: None Diabetes: No  How often do you need to have someone help you when you read instructions, pamphlets, or other written materials from your doctor or pharmacy?: 1 - Never  Interpreter Needed?: No  Information entered by :: Dixie Dials, CMA   Activities of Daily Living    12/13/2023   10:16 AM  In your present state of health, do you have any difficulty performing the following activities:  Hearing? 0  Vision? 0  Difficulty concentrating or making decisions? 0  Walking  or climbing stairs? 1  Dressing or bathing? 0  Doing errands, shopping? 0  Preparing Food and eating ? N  Using the Toilet? N  In the past six months, have you accidently leaked urine? N  Do you have problems with loss of bowel control? N  Managing your Medications? N  Managing your Finances? N  Housekeeping or managing your Housekeeping? N    Patient Care Team: Margaree Mackintosh, MD as PCP - General (Internal Medicine)  Indicate any recent Medical Services you may have received from other than [facility] providers in the past year (date may be approximate).     Assessment:   This is a routine wellness examination for Shital.  Hearing/Vision screen No results found.   Goals Addressed  None    Depression Screen    12/13/2023   10:23 AM 03/18/2023   11:41 AM 12/05/2022    4:07 PM 09/28/2021   11:11 AM 10/15/2020    8:03 PM 04/08/2020    9:45 AM 03/02/2019   11:05 AM  PHQ 2/9 Scores  PHQ - 2 Score 0 0 0 0 0 0 0    Fall Risk    12/13/2023   10:21 AM 03/18/2023   11:41 AM 12/05/2022    4:07 PM 09/28/2021   11:10 AM 09/15/2021   12:09 PM  Fall Risk   Falls in the past year? 1 0 0 1 0  Number falls in past yr: 0 0 0 0 0  Injury with Fall? 0 0 0 0 0  Risk for fall due to : No Fall Risks No Fall Risks No Fall Risks History of fall(s) No Fall Risks  Follow up Falls prevention discussed;Education provided;Falls evaluation completed Falls prevention discussed Falls prevention discussed Falls evaluation completed Falls evaluation completed    MEDICARE RISK AT HOME: Medicare Risk at Home Any stairs in or around the home?: No If so, are there any without handrails?: No Home free of loose throw rugs in walkways, pet beds, electrical cords, etc?: Yes Adequate lighting in your home to reduce risk of falls?: Yes Life alert?: No Use of a cane, walker or w/c?: No Grab bars in the bathroom?: Yes Shower chair or bench in shower?: Yes Elevated toilet seat or a handicapped toilet?:  Yes  TIMED UP AND GO:  Was the test performed?  No    Cognitive Function:        12/13/2023   10:21 AM 09/28/2021   11:09 AM  6CIT Screen  What Year? 0 points 0 points  What month? 0 points 0 points  What time? 0 points 0 points  Count back from 20 0 points 0 points  Months in reverse 0 points 0 points  Repeat phrase 0 points 0 points  Total Score 0 points 0 points    Immunizations Immunization History  Administered Date(s) Administered   Hepatitis A, Adult 08/01/2015   Hepatitis B, ADULT 08/01/2015, 09/30/2015   Influenza,inj,Quad PF,6+ Mos 10/01/2013, 10/21/2014, 09/30/2015, 01/17/2017, 10/10/2017, 10/25/2018, 09/18/2019, 09/28/2021   PFIZER(Purple Top)SARS-COV-2 Vaccination 02/25/2020, 03/15/2020   PNEUMOCOCCAL CONJUGATE-20 03/18/2023   Pneumococcal Polysaccharide-23 10/31/2020   Tdap 08/12/2012   Zoster, Live 07/11/2015    TDAP status: Due, Education has been provided regarding the importance of this vaccine. Advised may receive this vaccine at local pharmacy or Health Dept. Aware to provide a copy of the vaccination record if obtained from local pharmacy or Health Dept. Verbalized acceptance and understanding.  Flu Vaccine status: Due, Education has been provided regarding the importance of this vaccine. Advised may receive this vaccine at local pharmacy or Health Dept. Aware to provide a copy of the vaccination record if obtained from local pharmacy or Health Dept. Verbalized acceptance and understanding.  Pneumococcal vaccine status: Up to date  Covid-19 vaccine status: Information provided on how to obtain vaccines.   Qualifies for Shingles Vaccine? Yes   Zostavax completed No   Shingrix Completed?: No.    Education has been provided regarding the importance of this vaccine. Patient has been advised to call insurance company to determine out of pocket expense if they have not yet received this vaccine. Advised may also receive vaccine at local pharmacy or Health  Dept. Verbalized acceptance and understanding.  Screening Tests Health Maintenance  Topic Date  Due   COVID-19 Vaccine (4 - 2024-25 season) 12/29/2023 (Originally 08/25/2023)   Zoster Vaccines- Shingrix (1 of 2) 01/17/2024 (Originally 03/11/2005)   MAMMOGRAM  03/22/2024 (Originally 10/20/2022)   INFLUENZA VACCINE  03/23/2024 (Originally 07/25/2023)   DTaP/Tdap/Td (2 - Td or Tdap) 10/16/2024 (Originally 08/12/2022)   Diabetic kidney evaluation - eGFR measurement  11/06/2024   Diabetic kidney evaluation - Urine ACR  11/06/2024   Medicare Annual Wellness (AWV)  12/12/2024   Colonoscopy  08/21/2033   Pneumonia Vaccine 1+ Years old  Completed   DEXA SCAN  Completed   HPV VACCINES  Aged Out   Hepatitis C Screening  Discontinued    Health Maintenance  There are no preventive care reminders to display for this patient.  Colorectal cancer screening: Type of screening: Colonoscopy. Completed 08/22/23. Repeat every 10 years  Mammogram status: Ordered 12/13/23. Pt provided with contact info and advised to call to schedule appt.   Bone Density status: Completed 07/08/19. Results reflect: Bone density results: NORMAL. Repeat every 2 years.  Lung Cancer Screening: (Low Dose CT Chest recommended if Age 19-80 years, 20 pack-year currently smoking OR have quit w/in 15years.) does not qualify.    Additional Screening:  Hepatitis C Screening: does not qualify; Completed   Vision Screening: Recommended annual ophthalmology exams for early detection of glaucoma and other disorders of the eye. Is the patient up to date with their annual eye exam?   Who is the provider or what is the name of the office in which the patient attends annual eye exams?  If pt is not established with a provider, would they like to be referred to a provider to establish care? .   Dental Screening: Recommended annual dental exams for proper oral hygiene   Community Resource Referral / Chronic Care Management: CRR required  this visit?  No   CCM required this visit?  No     Plan:     I have personally reviewed and noted the following in the patient's chart:   Medical and social history Use of alcohol, tobacco or illicit drugs  Current medications and supplements including opioid prescriptions. Patient is not currently taking opioid prescriptions. Functional ability and status Nutritional status Physical activity Advanced directives List of other physicians Hospitalizations, surgeries, and ER visits in previous 12 months Vitals Screenings to include cognitive, depression, and falls Referrals and appointments  In addition, I have reviewed and discussed with patient certain preventive protocols, quality metrics, and best practice recommendations. A written personalized care plan for preventive services as well as general preventive health recommendations were provided to patient.     Araceli Sharlyne Cai, CMA   12/13/2023   After Visit Summary: (In Person-Printed) AVS printed and given to the patient  Pre-op exam: She also is presenting for pre-op evaluation for right knee arthroplasty which is planned with Dr. Despina Hick for Feb 3,2025.  In  late October, she developed acute left ankle pain. Had been pushing husband around in wheel chair and may have injured ankle somehow with that activity. Noticed onset October 20,2024.strain/sprain of left ankle.  No fracture was noted of ankle or foot on x-ray.  She had moderate plantar and minimal posterior calcaneal heel spurs.  Had minimal dorsal second tarsometatarsal degenerative spurring.  Cortices were intact.  Degenerative spurring noted along distal medial malleolus which was minimal.  Ankle mortise was symmetric and intact.  Normal bone mineralization.  Was treated with anti-inflammatory medication oxycodone/APAP 5/325 sparingly.    She returned here on November  12 with persistent left lower leg pain.  Ordered vascular ultrasound.  Study done on November 13  showed that DVT was not detected.  Was placed on prednisone taper and was told to hold meloxicam.  Was advised to take Lasix 20 mg daily for dependent edema.  Seen again and checked  labs on November 14 including sed rate which was 62, TSH which was normal,   Seen December 20 checked rheumatology labs including CCP which was less than 16 .ANA was positive with a titer of 1: 80 and pattern was nuclear, homogeneous..  Uric acid was normal.   Uric acid was normal.  Sed rate was 58.   Has received physical therapy to increase leg strength.  History of impaired glucose tolerance.  In April 2023 hemoglobin A1c was 6.3%, was 5.9% in October 2022, 6% in April 2022, 6.1% in 2021.  Also history of mixed hyperlipidemia with triglycerides of 175 in July 2023 and an LDL of 102..  Had been prescribed atorvastatin in 2014 and prescriptions were renewed annually.  Had similar issue with right leg in 2022 lateral aspect after doing a lot of yard work.  Had Doppler study at that time as well and was treated with Mobic and Flexeril.  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  Developed upper posterior thigh pain and right buttock pain as well as right lower leg pain.  Apparently symptoms started in July.  Had negative study for DVT August 2022.  Had lumbar back pain in September 2022 and MRI was ordered of the LS spine but I do not see where patient ever went for this study.  At that time she was given a 12-day Medrol 4 mg taper and oxycodone as well as Xanax to take at night for sleep.  Was seen at [facility] September 2022 and was given oxycodone APAP, gabapentin, ties Pablo Ledger and prednisone.  Was diagnosed with discogenic back pain and right lower extremity radiculopathy.  MRI was recommended but not sure it was ever done.  Past medical history: Dr. Huel Cote has been her GYN physician.  Had colonoscopy in 2016.  History of irritable bowel  syndrome.  This started around 2015.  History of depression, GE reflux and hyperlipidemia.  History of allergic rhinitis and fibrocystic breast disease.  Had trigger finger release by Dr. Mina Marble in the remote past.  Osteoarthritis of left knee was injected at [facility] in 2020.  Social history: She is retired.  Previously worked as a Financial controller for [employer].  Has a college degree and is married to a retired Runner, broadcasting/film/video who is disabled.  Does not smoke or consume alcohol.  Has grandchildren.  2 adult children, a son and a daughter.  Family history: Mother with history of heart disease, lung cancer as well as colon cancer.  1 brother with history of coronary artery disease.  History of left carotid bruit and ultrasound in 2018 showed 1 to 39% stenosis.  History of impaired glucose tolerance.  History of GE reflux and hyperlipidemia.  Plan: Prior to her knee arthroplasty I would like for her to have Cardiology evaluation.  Will make referral.  Continue current medications including atorvastatin, furosemide, meloxicam, metformin, Protonix, Effexor.

## 2023-12-13 NOTE — Patient Instructions (Addendum)
Next appointment: Follow up in one year for your annual wellness visit .  Labs reviewed.  Patient to have cardiology evaluation in anticipation of knee arthroplasty which is to be performed after the first of the year.  Return here in 6 months.   Preventive Care 33 Years and Older, Female Preventive care refers to lifestyle choices and visits with your health care provider that can promote health and wellness. What does preventive care include? A yearly physical exam. This is also called an annual well check. Dental exams once or twice a year. Routine eye exams. Ask your health care provider how often you should have your eyes checked. Personal lifestyle choices, including: Daily care of your teeth and gums. Regular physical activity. Eating a healthy diet. Avoiding tobacco and drug use. Limiting alcohol use. Practicing safe sex. Taking low-dose aspirin every day. Taking vitamin and mineral supplements as recommended by your health care provider. What happens during an annual well check? The services and screenings done by your health care provider during your annual well check will depend on your age, overall health, lifestyle risk factors, and family history of disease. Counseling  Your health care provider may ask you questions about your: Alcohol use. Tobacco use. Drug use. Emotional well-being. Home and relationship well-being. Sexual activity. Eating habits. History of falls. Memory and ability to understand (cognition). Work and work Astronomer. Reproductive health. Screening  You may have the following tests or measurements: Height, weight, and BMI. Blood pressure. Lipid and cholesterol levels. These may be checked every 5 years, or more frequently if you are over 28 years old. Skin check. Lung cancer screening. You may have this screening every year starting at age 3 if you have a 30-pack-year history of smoking and currently smoke or have quit within the past 15  years. Fecal occult blood test (FOBT) of the stool. You may have this test every year starting at age 61. Flexible sigmoidoscopy or colonoscopy. You may have a sigmoidoscopy every 5 years or a colonoscopy every 10 years starting at age 4. Hepatitis C blood test. Hepatitis B blood test. Sexually transmitted disease (STD) testing. Diabetes screening. This is done by checking your blood sugar (glucose) after you have not eaten for a while (fasting). You may have this done every 1-3 years. Bone density scan. This is done to screen for osteoporosis. You may have this done starting at age 59. Mammogram. This may be done every 1-2 years. Talk to your health care provider about how often you should have regular mammograms. Talk with your health care provider about your test results, treatment options, and if necessary, the need for more tests. Vaccines  Your health care provider may recommend certain vaccines, such as: Influenza vaccine. This is recommended every year. Tetanus, diphtheria, and acellular pertussis (Tdap, Td) vaccine. You may need a Td booster every 10 years. Zoster vaccine. You may need this after age 31. Pneumococcal 13-valent conjugate (PCV13) vaccine. One dose is recommended after age 25. Pneumococcal polysaccharide (PPSV23) vaccine. One dose is recommended after age 69. Talk to your health care provider about which screenings and vaccines you need and how often you need them. This information is not intended to replace advice given to you by your health care provider. Make sure you discuss any questions you have with your health care provider. Document Released: 01/06/2016 Document Revised: 08/29/2016 Document Reviewed: 10/11/2015 Elsevier Interactive Patient Education  2017 [employer].  Fall Prevention in the Home Falls can cause injuries. They can  happen to people of all ages. There are many things you can do to make your home safe and to help prevent falls. What can I do on  the outside of my home? Regularly fix the edges of walkways and driveways and fix any cracks. Remove anything that might make you trip as you walk through a door, such as a raised step or threshold. Trim any bushes or trees on the path to your home. Use bright outdoor lighting. Clear any walking paths of anything that might make someone trip, such as rocks or tools. Regularly check to see if handrails are loose or broken. Make sure that both sides of any steps have handrails. Any raised decks and porches should have guardrails on the edges. Have any leaves, snow, or ice cleared regularly. Use sand or salt on walking paths during winter. Clean up any spills in your garage right away. This includes oil or grease spills. What can I do in the bathroom? Use night lights. Install grab bars by the toilet and in the tub and shower. Do not use towel bars as grab bars. Use non-skid mats or decals in the tub or shower. If you need to sit down in the shower, use a plastic, non-slip stool. Keep the floor dry. Clean up any water that spills on the floor as soon as it happens. Remove soap buildup in the tub or shower regularly. Attach bath mats securely with double-sided non-slip rug tape. Do not have throw rugs and other things on the floor that can make you trip. What can I do in the bedroom? Use night lights. Make sure that you have a light by your bed that is easy to reach. Do not use any sheets or blankets that are too big for your bed. They should not hang down onto the floor. Have a firm chair that has side arms. You can use this for support while you get dressed. Do not have throw rugs and other things on the floor that can make you trip. What can I do in the kitchen? Clean up any spills right away. Avoid walking on wet floors. Keep items that you use a lot in easy-to-reach places. If you need to reach something above you, use a strong step stool that has a grab bar. Keep electrical cords out  of the way. Do not use floor polish or wax that makes floors slippery. If you must use wax, use non-skid floor wax. Do not have throw rugs and other things on the floor that can make you trip. What can I do with my stairs? Do not leave any items on the stairs. Make sure that there are handrails on both sides of the stairs and use them. Fix handrails that are broken or loose. Make sure that handrails are as long as the stairways. Check any carpeting to make sure that it is firmly attached to the stairs. Fix any carpet that is loose or worn. Avoid having throw rugs at the top or bottom of the stairs. If you do have throw rugs, attach them to the floor with carpet tape. Make sure that you have a light switch at the top of the stairs and the bottom of the stairs. If you do not have them, ask someone to add them for you. What else can I do to help prevent falls? Wear shoes that: Do not have high heels. Have rubber bottoms. Are comfortable and fit you well. Are closed at the toe. Do not wear sandals.  If you use a stepladder: Make sure that it is fully opened. Do not climb a closed stepladder. Make sure that both sides of the stepladder are locked into place. Ask someone to hold it for you, if possible. Clearly mark and make sure that you can see: Any grab bars or handrails. First and last steps. Where the edge of each step is. Use tools that help you move around (mobility aids) if they are needed. These include: Canes. Walkers. Scooters. Crutches. Turn on the lights when you go into a dark area. Replace any light bulbs as soon as they burn out. Set up your furniture so you have a clear path. Avoid moving your furniture around. If any of your floors are uneven, fix them. If there are any pets around you, be aware of where they are. Review your medicines with your doctor. Some medicines can make you feel dizzy. This can increase your chance of falling. Ask your doctor what other things that  you can do to help prevent falls. This information is not intended to replace advice given to you by your health care provider. Make sure you discuss any questions you have with your health care provider. Document Released: 10/06/2009 Document Revised: 05/17/2016 Document Reviewed: 01/14/2015 Elsevier Interactive Patient Education  2017 [employer].

## 2023-12-17 ENCOUNTER — Other Ambulatory Visit: Payer: Self-pay | Admitting: Internal Medicine

## 2023-12-17 LAB — COMPLETE METABOLIC PANEL WITH GFR
AG Ratio: 0.9 (calc) — ABNORMAL LOW (ref 1.0–2.5)
ALT: 16 U/L (ref 6–29)
AST: 13 U/L (ref 10–35)
Albumin: 3.4 g/dL — ABNORMAL LOW (ref 3.6–5.1)
Alkaline phosphatase (APISO): 86 U/L (ref 37–153)
BUN/Creatinine Ratio: 26 (calc) — ABNORMAL HIGH (ref 6–22)
BUN: 12 mg/dL (ref 7–25)
CO2: 28 mmol/L (ref 20–32)
Calcium: 9.2 mg/dL (ref 8.6–10.4)
Chloride: 102 mmol/L (ref 98–110)
Creat: 0.46 mg/dL — ABNORMAL LOW (ref 0.50–1.05)
Globulin: 3.6 g/dL (ref 1.9–3.7)
Glucose, Bld: 135 mg/dL — ABNORMAL HIGH (ref 65–99)
Potassium: 4.5 mmol/L (ref 3.5–5.3)
Sodium: 143 mmol/L (ref 135–146)
Total Bilirubin: 0.5 mg/dL (ref 0.2–1.2)
Total Protein: 7 g/dL (ref 6.1–8.1)
eGFR: 104 mL/min/{1.73_m2} (ref >=60)

## 2023-12-17 LAB — RHEUMATOID FACTOR: Rheumatoid fact SerPl-aCnc: 14 [IU]/mL — ABNORMAL HIGH (ref <14)

## 2023-12-17 LAB — CYCLIC CITRUL PEPTIDE ANTIBODY, IGG: Cyclic Citrullin Peptide Ab: <16 U

## 2023-12-17 LAB — ANTI-NUCLEAR AB-TITER (ANA TITER): ANA Titer 1: 1:80 {titer} — ABNORMAL HIGH

## 2023-12-17 LAB — URIC ACID: Uric Acid, Serum: 4 mg/dL (ref 2.5–7.0)

## 2023-12-17 LAB — ANA: Anti Nuclear Antibody (ANA): POSITIVE — AB

## 2023-12-17 LAB — SEDIMENTATION RATE: Sed Rate: 58 mm/h — ABNORMAL HIGH (ref 0–30)

## 2023-12-23 ENCOUNTER — Encounter: Payer: Self-pay | Admitting: Internal Medicine

## 2023-12-23 ENCOUNTER — Encounter: Payer: Self-pay | Admitting: Physical Therapy

## 2023-12-23 ENCOUNTER — Ambulatory Visit: Payer: Medicare Other | Admitting: Physical Therapy

## 2023-12-23 DIAGNOSIS — M6281 Muscle weakness (generalized): Secondary | ICD-10-CM

## 2023-12-23 DIAGNOSIS — M170 Bilateral primary osteoarthritis of knee: Secondary | ICD-10-CM

## 2023-12-23 DIAGNOSIS — R262 Difficulty in walking, not elsewhere classified: Secondary | ICD-10-CM

## 2023-12-23 DIAGNOSIS — R2681 Unsteadiness on feet: Secondary | ICD-10-CM

## 2023-12-23 DIAGNOSIS — R278 Other lack of coordination: Secondary | ICD-10-CM

## 2023-12-23 NOTE — Therapy (Signed)
OUTPATIENT PHYSICAL THERAPY LOWER EXTREMITY EVALUATION   Patient Name: Majesti Duhn MRN: [id number] DOB:[birth year], 67 y.o., female Today's Date: 12/23/2023  END OF SESSION:  PT End of Session - 12/23/23 1150     Visit Number 2    Date for PT Re-Evaluation 02/06/24    PT Start Time 1148    PT Stop Time 1228    PT Time Calculation (min) 40 min    Activity Tolerance Patient limited by pain    Behavior During Therapy [facility] for tasks assessed/performed            Past Medical History:  Diagnosis Date   Allergy    Anemia    Asthma    Depression    GERD (gastroesophageal reflux disease)    Hyperlipidemia    History reviewed. No pertinent surgical history. Patient Active Problem List   Diagnosis Date Noted   Back pain 11/17/2021   Pain in left knee 03/05/2018   Obesity, unspecified 10/01/2013   Allergic rhinitis 10/01/2013   GE reflux 08/12/2012   Depression 08/12/2012   Hyperlipidemia, mixed 08/12/2012    PCP: Margaree Mackintosh, MD   REFERRING PROVIDER: Ollen Gross, MD   REFERRING DIAG: M17.0 (ICD-10-CM) - Primary osteoarthritis of both knees   THERAPY DIAG:  Primary osteoarthritis of both knees  Other lack of coordination  Muscle weakness (generalized)  Difficulty in walking, not elsewhere classified  Unsteadiness on feet  Rationale for Evaluation and Treatment: Rehabilitation  ONSET DATE: 12/09/23  SUBJECTIVE:   SUBJECTIVE STATEMENT: Patient reports that she was not able to perform her HEP over the Holidays, but started it yesterday. She was sore and stiff this morning.   Eval-Patient reports that she has started taking Lasix due to her increased swelling. She arrives for PT to increase her leg strength in preparation for R TKR in Feb. Her husband is disabled, has been seen here before for therapy.  PERTINENT HISTORY: Per referring physician note: Dr reports bone on bone OA of R knee, B foot and lower leg swelling. She has developed weakness due  to pain, ROM 0-125 with crepitus. TKR planned for Feb and patient referred to primary Dr for the swelling issue. Pre-op PT for general strengthening in preparation for TKR in Feb. Husband disabled and cannot walk PAIN:  Are you having pain? Yes: NPRS scale: 4/10 Pain location: both knees Pain description: burning Aggravating factors: Any movement, walking Relieving factors: nothing is effective  PRECAUTIONS: Knee and Fall  RED FLAGS: None   WEIGHT BEARING RESTRICTIONS: No  FALLS:  Has patient fallen in last 6 months? No  LIVING ENVIRONMENT: Lives with: lives with their family Lives in: House/apartment Stairs: No Has following equipment at home: None  OCCUPATION: not working. Likes to garden, does lawn care, all indoor housework.  PLOF: Independent  PATIENT GOALS: patient would like to get ready for surgery  NEXT MD VISIT: Mid January  OBJECTIVE:  Note: Objective measures were completed at Evaluation unless otherwise noted.  DIAGNOSTIC FINDINGS:  Per report X rays reveal bone on bone in R knee.  COGNITION: Overall cognitive status: Within functional limits for tasks assessed     SENSATION: Not tested  EDEMA:  B Lower leg edema, patient has started Lasix  MUSCLE LENGTH: Hamstrings: WNL B   POSTURE: rounded shoulders and left pelvic obliquity  PALPATION: B knees edemetous, TTP  LOWER EXTREMITY ROM: WNL except as noted  Active ROM Right eval Left eval  Hip flexion    Hip extension  Hip abduction    Hip adduction    Hip internal rotation    Hip external rotation    Knee flexion 113 118  Knee extension -20 -5  Ankle dorsiflexion    Ankle plantarflexion    Ankle inversion    Ankle eversion     (Blank rows = not tested)  LOWER EXTREMITY MMT: R LE 4+/5 MMT, functional weakness noted with difficulty in sit to stand and gait  FUNCTIONAL TESTS:  30 seconds chair stand test Timed up and go (TUG): 15.3 sec, stiff legs, increased lat sway to advance  each leg 2 minute walk test: 200' 5x STS from elevated surface 17.2, used hands. Could not perform from regular height.  GAIT: Distance walked: I Assistive device utilized: None Level of assistance: Modified independence Comments: Upon entering gym, patient had great difficulty walking, with increased lateral trunk sway, antalgic steps B, slow and very effortful, unsteady. This did improve somewhat after assessment which involved joint movement and muscle activation, but still impaired.   TODAY'S TREATMENT:                                                                                                                              DATE:  12/23/23 NuStep L3 x 6 minutes Supine SLR on R x 10, easy, repeat with hip in ER, much more challenging, Quad set with heel elevated to promote extension ROM 5 sec holds x 10 Supine PROM for R knee ext, 4 x 5 sec holds Supine SAQ with light yellow Tband resistance 10 reps Clamshells against G tband resistance, x 10 reps  12/12/23 Education    PATIENT EDUCATION:  Education details: POC Person educated: Patient Education method: Explanation Education comprehension: verbalized understanding  HOME EXERCISE PROGRAM: Has general TKR exercise program from Dr. Catalina Pizza to continue this until next appointment and will update. [id number] to open it in our system- updated program 12/23/23 ASSESSMENT:  CLINICAL IMPRESSION: Patient reports continued pain in R knee. Updated her HEP to include some very gentle resistance to increase her challenge on the exercises which were too easy. Also introduced additional stretching for knee flex and ext, all very controlled activities to try to not aggravate the knee. She will perform the updated HEP and report on her tolerance.  Eval-Patient is a 67 y.o. who was seen today for physical therapy evaluation and treatment for end stage OA of R knee. TKR scheduled for Feb. Referred today for pre-op strengthening. She  demonstrates BLE weakness and knee pain, with stiffness, impaired balance, impaired safety and I with all functional mobility. She reports severe limitations in her normal daily activities. She is aware that PT will not eliminate all pain, but she would greatly benefit from PT to maximize her ROM and strength in not only her B knees, but ankles, hips, and trunk as well. She will benefit from Upper body strengthening as well in preparation for using a RW after surgery.   OBJECTIVE  IMPAIRMENTS: Abnormal gait, decreased activity tolerance, decreased balance, decreased coordination, decreased endurance, difficulty walking, decreased ROM, decreased strength, increased edema, impaired flexibility, and pain.   ACTIVITY LIMITATIONS: bending, sitting, standing, squatting, stairs, transfers, and locomotion level  PARTICIPATION LIMITATIONS: meal prep, cleaning, laundry, shopping, and community activity  PERSONAL FACTORS: Past/current experiences are also affecting patient's functional outcome.   REHAB POTENTIAL: Good  CLINICAL DECISION MAKING: Stable/uncomplicated  EVALUATION COMPLEXITY: Low   GOALS: Goals reviewed with patient? Yes  SHORT TERM GOALS: Target date: 12/29/23 I with initial HEP Baseline: Goal status: 12/23/23- her previous program was updated today. ongoing  LONG TERM GOALS: Target date: 02/06/24  I with final HEP Baseline:  Goal status: INITIAL  2.  5 x STS in < 12 sec, stable, no UE support Baseline:  Goal status: INITIAL  3.  TUG in < 12 sec, no unsteadiness Baseline:  Goal status: INITIAL  4.  Ambulate x at least 300' in 2 min walk test, no gait deviations or antalgic pattern. Baseline:  Goal status: INITIAL  PLAN:  PT FREQUENCY: 2x/week  PT DURATION: 10 weeks  PLANNED INTERVENTIONS: 97110-Therapeutic exercises, 97530- Therapeutic activity, [id number]- Neuromuscular re-education, 97535- Self Care, [id number]- Manual therapy, [id number]- Gait training, 97014- Electrical  stimulation (unattended), 97016- Vasopneumatic device, Patient/Family education, Balance training, Stair training, Joint mobilization, Cryotherapy, and Moist heat  PLAN FOR NEXT SESSION: Update HEP, with seated knee ext against yellow Tband resistance.   Iona Beard, DPT 12/23/2023, 12:29 PM

## 2023-12-27 ENCOUNTER — Ambulatory Visit: Payer: Medicare Other | Attending: Orthopedic Surgery | Admitting: Physical Therapy

## 2023-12-27 ENCOUNTER — Encounter: Payer: Self-pay | Admitting: Physical Therapy

## 2023-12-27 DIAGNOSIS — M170 Bilateral primary osteoarthritis of knee: Secondary | ICD-10-CM | POA: Insufficient documentation

## 2023-12-27 DIAGNOSIS — R262 Difficulty in walking, not elsewhere classified: Secondary | ICD-10-CM | POA: Diagnosis present

## 2023-12-27 DIAGNOSIS — M6281 Muscle weakness (generalized): Secondary | ICD-10-CM | POA: Insufficient documentation

## 2023-12-27 DIAGNOSIS — R2681 Unsteadiness on feet: Secondary | ICD-10-CM | POA: Insufficient documentation

## 2023-12-27 DIAGNOSIS — R278 Other lack of coordination: Secondary | ICD-10-CM | POA: Diagnosis present

## 2023-12-27 NOTE — Therapy (Signed)
 OUTPATIENT PHYSICAL THERAPY LOWER EXTREMITY EVALUATION   Patient Name: Donna Spears MRN: [id number] DOB:Nov 06, 1956, 68 y.o., female Today's Date: 12/27/2023  END OF SESSION:  PT End of Session - 12/27/23 1025     Visit Number 3    Date for PT Re-Evaluation 02/06/24    PT Start Time 1015    PT Stop Time 1055    PT Time Calculation (min) 40 min    Activity Tolerance Patient limited by pain    Behavior During Therapy [facility] for tasks assessed/performed             Past Medical History:  Diagnosis Date   Allergy    Anemia    Asthma    Depression    GERD (gastroesophageal reflux disease)    Hyperlipidemia    History reviewed. No pertinent surgical history. Patient Active Problem List   Diagnosis Date Noted   Back pain 11/17/2021   Pain in left knee 03/05/2018   Obesity, unspecified 10/01/2013   Allergic rhinitis 10/01/2013   GE reflux 08/12/2012   Depression 08/12/2012   Hyperlipidemia, mixed 08/12/2012    PCP: [name], MD   REFERRING PROVIDER: Melodi Lerner, MD   REFERRING DIAG: M17.0 (ICD-10-CM) - Primary osteoarthritis of both knees   THERAPY DIAG:  Primary osteoarthritis of both knees  Other lack of coordination  Muscle weakness (generalized)  Difficulty in walking, not elsewhere classified  Unsteadiness on feet  Rationale for Evaluation and Treatment: Rehabilitation  ONSET DATE: 12/09/23  SUBJECTIVE:   SUBJECTIVE STATEMENT: Patient reports that she has been very sore since her last treatment. Possibly due to the knee ext ROM.  Eval-Patient reports that she has started taking Lasix  due to her increased swelling. She arrives for PT to increase her leg strength in preparation for R TKR in Feb. Her husband is disabled, has been seen here before for therapy.  PERTINENT HISTORY: Per referring physician note: Dr reports bone on bone OA of R knee, B foot and lower leg swelling. She has developed weakness due to pain, ROM 0-125 with crepitus.  TKR planned for Feb and patient referred to primary Dr for the swelling issue. Pre-op PT for general strengthening in preparation for TKR in Feb. Husband disabled and cannot walk PAIN:  Are you having pain? Yes: NPRS scale: 4/10 Pain location: both knees Pain description: burning Aggravating factors: Any movement, walking Relieving factors: nothing is effective  PRECAUTIONS: Knee and Fall  RED FLAGS: None   WEIGHT BEARING RESTRICTIONS: No  FALLS:  Has patient fallen in last 6 months? No  LIVING ENVIRONMENT: Lives with: lives with their family Lives in: House/apartment Stairs: No Has following equipment at home: None  OCCUPATION: not working. Likes to garden, does lawn care, all indoor housework.  PLOF: Independent  PATIENT GOALS: patient would like to get ready for surgery  NEXT MD VISIT: Mid January  OBJECTIVE:  Note: Objective measures were completed at Evaluation unless otherwise noted.  DIAGNOSTIC FINDINGS:  Per report X rays reveal bone on bone in R knee.  COGNITION: Overall cognitive status: Within functional limits for tasks assessed     SENSATION: Not tested  EDEMA:  B Lower leg edema, patient has started Lasix   MUSCLE LENGTH: Hamstrings: WNL B   POSTURE: rounded shoulders and left pelvic obliquity  PALPATION: B knees edemetous, TTP  LOWER EXTREMITY ROM: WNL except as noted  Active ROM Right eval Left eval  Hip flexion    Hip extension    Hip abduction  Hip adduction    Hip internal rotation    Hip external rotation    Knee flexion 113 118  Knee extension -20 -5  Ankle dorsiflexion    Ankle plantarflexion    Ankle inversion    Ankle eversion     (Blank rows = not tested)  LOWER EXTREMITY MMT: R LE 4+/5 MMT, functional weakness noted with difficulty in sit to stand and gait  FUNCTIONAL TESTS:  30 seconds chair stand test Timed up and go (TUG): 15.3 sec, stiff legs, increased lat sway to advance each leg 2 minute walk test:  200' 5x STS from elevated surface 17.2, used hands. Could not perform from regular height.  GAIT: Distance walked: I Assistive device utilized: None Level of assistance: Modified independence Comments: Upon entering gym, patient had great difficulty walking, with increased lateral trunk sway, antalgic steps B, slow and very effortful, unsteady. This did improve somewhat after assessment which involved joint movement and muscle activation, but still impaired.   TODAY'S TREATMENT:                                                                                                                              DATE:  12/27/23 NuStep L3 x 5 minutes Supine with BLE over physioball- DKTC, bridge, bridge with B hip IR, bridge with ball roll side to side x 10 each L sidelying hip abd x 10 reps Prone hip ext with knee ext and then with knee flex, TKE, 10 each Standing TKE against ball on wall x 10 VASO med pressure, 10 min to R knee for pain and inflammation.  12/23/23 NuStep L3 x 6 minutes Supine SLR on R x 10, easy, repeat with hip in ER, much more challenging, Quad set with heel elevated to promote extension ROM 5 sec holds x 10 Supine PROM for R knee ext, 4 x 5 sec holds Supine SAQ with light yellow Tband resistance 10 reps Clamshells against G tband resistance, x 10 reps  12/12/23 Education    PATIENT EDUCATION:  Education details: POC Person educated: Patient Education method: Explanation Education comprehension: verbalized understanding  HOME EXERCISE PROGRAM: Has general TKR exercise program from Dr. Odette to continue this until next appointment and will update. [id number] to open it in our system- updated program 12/23/23 ASSESSMENT:  CLINICAL IMPRESSION: Patient reports continued pain in R knee. Exacerbated after her last treatment, possibly due to knee ext PROM. Had her perform additional strength and stability exercises for hips and trunk as well as Knee, updating HEP,  emphasized TKE to activate quad. Finished with VASO today to try to control her pain.  Eval-Patient is a 68 y.o. who was seen today for physical therapy evaluation and treatment for end stage OA of R knee. TKR scheduled for Feb. Referred today for pre-op strengthening. She demonstrates BLE weakness and knee pain, with stiffness, impaired balance, impaired safety and I with all functional mobility. She reports severe limitations in her normal  daily activities. She is aware that PT will not eliminate all pain, but she would greatly benefit from PT to maximize her ROM and strength in not only her B knees, but ankles, hips, and trunk as well. She will benefit from Upper body strengthening as well in preparation for using a RW after surgery.   OBJECTIVE IMPAIRMENTS: Abnormal gait, decreased activity tolerance, decreased balance, decreased coordination, decreased endurance, difficulty walking, decreased ROM, decreased strength, increased edema, impaired flexibility, and pain.   ACTIVITY LIMITATIONS: bending, sitting, standing, squatting, stairs, transfers, and locomotion level  PARTICIPATION LIMITATIONS: meal prep, cleaning, laundry, shopping, and community activity  PERSONAL FACTORS: Past/current experiences are also affecting patient's functional outcome.   REHAB POTENTIAL: Good  CLINICAL DECISION MAKING: Stable/uncomplicated  EVALUATION COMPLEXITY: Low   GOALS: Goals reviewed with patient? Yes  SHORT TERM GOALS: Target date: 12/29/23 I with initial HEP Baseline: Goal status: 12/27/23-met  LONG TERM GOALS: Target date: 02/06/24  I with final HEP Baseline:  Goal status: INITIAL  2.  5 x STS in < 12 sec, stable, no UE support Baseline:  Goal status: INITIAL  3.  TUG in < 12 sec, no unsteadiness Baseline:  Goal status: INITIAL  4.  Ambulate x at least 300' in 2 min walk test, no gait deviations or antalgic pattern. Baseline:  Goal status: INITIAL  PLAN:  PT FREQUENCY: 2x/week  PT  DURATION: 10 weeks  PLANNED INTERVENTIONS: 97110-Therapeutic exercises, 97530- Therapeutic activity, 97112- Neuromuscular re-education, 97535- Self Care, [id number]- Manual therapy, [phone]- Gait training, 97014- Electrical stimulation (unattended), 97016- Vasopneumatic device, Patient/Family education, Balance training, Stair training, Joint mobilization, Cryotherapy, and Moist heat  PLAN FOR NEXT SESSION: ** 2 more visits per insurance- make sure HEP is complete.   Devere CHRISTELLA Mean, DPT 12/27/2023, 11:02 AM

## 2023-12-30 ENCOUNTER — Ambulatory Visit: Payer: Medicare Other | Admitting: Cardiology

## 2023-12-31 ENCOUNTER — Encounter: Payer: Self-pay | Admitting: Physical Therapy

## 2023-12-31 ENCOUNTER — Other Ambulatory Visit: Payer: Medicare Other

## 2023-12-31 ENCOUNTER — Ambulatory Visit: Payer: Medicare Other | Admitting: Physical Therapy

## 2023-12-31 DIAGNOSIS — M170 Bilateral primary osteoarthritis of knee: Secondary | ICD-10-CM

## 2023-12-31 DIAGNOSIS — R2681 Unsteadiness on feet: Secondary | ICD-10-CM

## 2023-12-31 DIAGNOSIS — M6281 Muscle weakness (generalized): Secondary | ICD-10-CM

## 2023-12-31 DIAGNOSIS — R278 Other lack of coordination: Secondary | ICD-10-CM

## 2023-12-31 DIAGNOSIS — R262 Difficulty in walking, not elsewhere classified: Secondary | ICD-10-CM

## 2023-12-31 NOTE — Therapy (Signed)
 OUTPATIENT PHYSICAL THERAPY LOWER EXTREMITY EVALUATION   Patient Name: Donna Spears MRN: [id number] DOB:Apr 19, 1956, 68 y.o., female Today's Date: 12/31/2023  END OF SESSION:  PT End of Session - 12/31/23 1237     Visit Number 4    Date for PT Re-Evaluation 02/06/24    PT Start Time 1230    PT Stop Time 1310    PT Time Calculation (min) 40 min    Activity Tolerance Patient limited by pain    Behavior During Therapy [facility] for tasks assessed/performed              Past Medical History:  Diagnosis Date   Allergy    Anemia    Asthma    Depression    GERD (gastroesophageal reflux disease)    Hyperlipidemia    History reviewed. No pertinent surgical history. Patient Active Problem List   Diagnosis Date Noted   Back pain 11/17/2021   Pain in left knee 03/05/2018   Obesity, unspecified 10/01/2013   Allergic rhinitis 10/01/2013   GE reflux 08/12/2012   Depression 08/12/2012   Hyperlipidemia, mixed 08/12/2012    PCP: [name], MD   REFERRING PROVIDER: Melodi Lerner, MD   REFERRING DIAG: M17.0 (ICD-10-CM) - Primary osteoarthritis of both knees   THERAPY DIAG:  Primary osteoarthritis of both knees  Other lack of coordination  Muscle weakness (generalized)  Difficulty in walking, not elsewhere classified  Unsteadiness on feet  Rationale for Evaluation and Treatment: Rehabilitation  ONSET DATE: 12/09/23  SUBJECTIVE:   SUBJECTIVE STATEMENT: Patient reports that she has been very sore since her last treatment. Possibly due to the knee ext ROM.  Eval-Patient reports that she has started taking Lasix  due to her increased swelling. She arrives for PT to increase her leg strength in preparation for R TKR in Feb. Her husband is disabled, has been seen here before for therapy.  PERTINENT HISTORY: Per referring physician note: Dr reports bone on bone OA of R knee, B foot and lower leg swelling. She has developed weakness due to pain, ROM 0-125 with crepitus.  TKR planned for Feb and patient referred to primary Dr for the swelling issue. Pre-op PT for general strengthening in preparation for TKR in Feb. Husband disabled and cannot walk PAIN:  Are you having pain? Yes: NPRS scale: 4/10 Pain location: both knees Pain description: burning Aggravating factors: Any movement, walking Relieving factors: nothing is effective  PRECAUTIONS: Knee and Fall  RED FLAGS: None   WEIGHT BEARING RESTRICTIONS: No  FALLS:  Has patient fallen in last 6 months? No  LIVING ENVIRONMENT: Lives with: lives with their family Lives in: House/apartment Stairs: No Has following equipment at home: None  OCCUPATION: not working. Likes to garden, does lawn care, all indoor housework.  PLOF: Independent  PATIENT GOALS: patient would like to get ready for surgery  NEXT MD VISIT: Mid January  OBJECTIVE:  Note: Objective measures were completed at Evaluation unless otherwise noted.  DIAGNOSTIC FINDINGS:  Per report X rays reveal bone on bone in R knee.  COGNITION: Overall cognitive status: Within functional limits for tasks assessed     SENSATION: Not tested  EDEMA:  B Lower leg edema, patient has started Lasix   MUSCLE LENGTH: Hamstrings: WNL B   POSTURE: rounded shoulders and left pelvic obliquity  PALPATION: B knees edemetous, TTP  LOWER EXTREMITY ROM: WNL except as noted  Active ROM Right eval Left eval  Hip flexion    Hip extension    Hip abduction  Hip adduction    Hip internal rotation    Hip external rotation    Knee flexion 113 118  Knee extension -20 -5  Ankle dorsiflexion    Ankle plantarflexion    Ankle inversion    Ankle eversion     (Blank rows = not tested)  LOWER EXTREMITY MMT: R LE 4+/5 MMT, functional weakness noted with difficulty in sit to stand and gait  FUNCTIONAL TESTS:  30 seconds chair stand test Timed up and go (TUG): 15.3 sec, stiff legs, increased lat sway to advance each leg 2 minute walk test:  200' 5x STS from elevated surface 17.2, used hands. Could not perform from regular height.  GAIT: Distance walked: I Assistive device utilized: None Level of assistance: Modified independence Comments: Upon entering gym, patient had great difficulty walking, with increased lateral trunk sway, antalgic steps B, slow and very effortful, unsteady. This did improve somewhat after assessment which involved joint movement and muscle activation, but still impaired.   TODAY'S TREATMENT:                                                                                                                              DATE:  12/31/23 NuStep L3 x 5 minutes. Reviewed, updated HEP, see program for all exercises performed.  12/27/23 NuStep L3 x 5 minutes Supine with BLE over physioball- DKTC, bridge, bridge with B hip IR, bridge with ball roll side to side x 10 each L sidelying hip abd x 10 reps Prone hip ext with knee ext and then with knee flex, TKE, 10 each Standing TKE against ball on wall x 10 VASO med pressure, 10 min to R knee for pain and inflammation.  12/23/23 NuStep L3 x 6 minutes Supine SLR on R x 10, easy, repeat with hip in ER, much more challenging, Quad set with heel elevated to promote extension ROM 5 sec holds x 10 Supine PROM for R knee ext, 4 x 5 sec holds Supine SAQ with light yellow Tband resistance 10 reps Clamshells against G tband resistance, x 10 reps  12/12/23 Education    PATIENT EDUCATION:  Education details: POC Person educated: Patient Education method: Explanation Education comprehension: verbalized understanding  HOME EXERCISE PROGRAM: Has general TKR exercise program from Dr. Odette to continue this until next appointment and will update. [id number] to open it in our system- updated program 12/23/23 12/31/23- current HEP, SLR, SLE with Hip ER, SAQ, Quad set with heel elevated, bridge, pelvic tilt, Seated long kick with pulses,  ASSESSMENT:  CLINICAL  IMPRESSION: Patient reports continued pain in R knee. Treatment focused on completing her HEP so she can perform at home and make sure she understands all exercises prior to her last visit.  Eval-Patient is a 68 y.o. who was seen today for physical therapy evaluation and treatment for end stage OA of R knee. TKR scheduled for Feb. Referred today for pre-op strengthening. She demonstrates BLE weakness and knee pain,  with stiffness, impaired balance, impaired safety and I with all functional mobility. She reports severe limitations in her normal daily activities. She is aware that PT will not eliminate all pain, but she would greatly benefit from PT to maximize her ROM and strength in not only her B knees, but ankles, hips, and trunk as well. She will benefit from Upper body strengthening as well in preparation for using a RW after surgery.   OBJECTIVE IMPAIRMENTS: Abnormal gait, decreased activity tolerance, decreased balance, decreased coordination, decreased endurance, difficulty walking, decreased ROM, decreased strength, increased edema, impaired flexibility, and pain.   ACTIVITY LIMITATIONS: bending, sitting, standing, squatting, stairs, transfers, and locomotion level  PARTICIPATION LIMITATIONS: meal prep, cleaning, laundry, shopping, and community activity  PERSONAL FACTORS: Past/current experiences are also affecting patient's functional outcome.   REHAB POTENTIAL: Good  CLINICAL DECISION MAKING: Stable/uncomplicated  EVALUATION COMPLEXITY: Low   GOALS: Goals reviewed with patient? Yes  SHORT TERM GOALS: Target date: 12/29/23 I with initial HEP Baseline: Goal status: 12/27/23-met  LONG TERM GOALS: Target date: 02/06/24  I with final HEP Baseline:  Goal status: 12/31/23-Final HEP provided, will assess at home. ongoing  2.  5 x STS in < 12 sec, stable, no UE support Baseline:  Goal status: INITIAL  3.  TUG in < 12 sec, no unsteadiness Baseline:  Goal status: INITIAL  4.   Ambulate x at least 300' in 2 min walk test, no gait deviations or antalgic pattern. Baseline:  Goal status: INITIAL  PLAN:  PT FREQUENCY: 2x/week  PT DURATION: 10 weeks  PLANNED INTERVENTIONS: 97110-Therapeutic exercises, 97530- Therapeutic activity, [id number]- Neuromuscular re-education, 97535- Self Care, 02859- Manual therapy, [phone]- Gait training, 97014- Electrical stimulation (unattended), 97016- Vasopneumatic device, Patient/Family education, Balance training, Stair training, Joint mobilization, Cryotherapy, and Moist heat  PLAN FOR NEXT SESSION: 01/09/24-last visit, check HEP   Devere CHRISTELLA Mean, DPT 12/31/2023, 1:17 PM

## 2024-01-02 ENCOUNTER — Ambulatory Visit: Payer: Medicare Other | Admitting: Internal Medicine

## 2024-01-02 ENCOUNTER — Ambulatory Visit: Payer: Medicare Other | Admitting: Physical Therapy

## 2024-01-04 ENCOUNTER — Encounter: Payer: Self-pay | Admitting: Cardiovascular Disease

## 2024-01-04 NOTE — Progress Notes (Addendum)
  Cardiology Office Note:  .   Date:  01/06/2024  ID:  Donna Spears, DOB 12/28/1955, MRN [id number] PCP: [name], MD  [facility] Providers Cardiologist:  Aleene Passe, MD    History of Present Illness: .   Donna Spears is a 68 y.o. female  with hx of leg edema   Venous duplex of left leg is negative for DVT   She is scheduled for total knee replacement with Dr. Hiram On Feb. 3  She has lots of knee pain ,  she rode her husbands scooter to the visit to day   No regular exercise   Has had foot swelling for 2 months .  Was walking until 6 months ago .  Still eating processed meats, and some salt    She is not having any real cardiac issues.  I suspect that her leg edema is due to generalized and activity.  She was ambulating regularly but now has not done so in 6 months.  Will get an echocardiogram to ensure that she has normal left ventricular function.  If she does then we will clear her for her knee surgery.  Will plan on seeing her on an as-needed basis.      ROS:   Studies Reviewed: SABRA   EKG Interpretation Date/Time:  Monday January 06 2024 16:31:51 EST Ventricular Rate:  87 PR Interval:  136 QRS Duration:  76 QT Interval:  378 QTC Calculation: 454 R Axis:   47  Text Interpretation: Normal sinus rhythm with sinus arrhythmia Normal ECG No previous ECGs available Confirmed by Passe Aleene ([id number]) on 01/06/2024 4:36:42 PM     Risk Assessment/Calculations:             Physical Exam:   VS:  BP 138/86   Pulse 92   Ht 5' 4 (1.626 m)   Wt 166 lb 3.2 oz (75.4 kg)   SpO2 95%   BMI 28.53 kg/m    Wt Readings from Last 3 Encounters:  01/06/24 166 lb 3.2 oz (75.4 kg)  12/13/23 173 lb (78.5 kg)  12/10/23 175 lb (79.4 kg)    GEN: Well nourished, well developed in no acute distress NECK: No JVD; No carotid bruits CARDIAC: RRR, no murmurs, rubs, gallops RESPIRATORY:  Clear to auscultation without rales, wheezing or rhonchi  ABDOMEN: Soft, non-tender,  non-distended EXTREMITIES:  No edema; No deformity   ASSESSMENT AND PLAN: .    Leg edema :  She has had leg edema for the past 2 months.  She was ambulating but has stopped walking because of knee pain approximately 6 months ago. Still eating processed meats, and some salt    She is not having any real cardiac issues.  I suspect that her leg edema is due to generalized and activity.  She was ambulating regularly but now has not done so in 6 months.  Will get an echocardiogram to ensure that she has normal left ventricular function.  If she does then we will clear her for her knee surgery.  Will plan on seeing her on an as-needed basis.        Dispo: PRN   Signed, Aleene Passe, MD

## 2024-01-06 ENCOUNTER — Encounter: Payer: Self-pay | Admitting: Cardiovascular Disease

## 2024-01-06 ENCOUNTER — Ambulatory Visit: Payer: Medicare Other | Attending: Cardiovascular Disease | Admitting: Cardiovascular Disease

## 2024-01-06 VITALS — BP 138/86 | HR 92 | Ht 64.0 in | Wt 166.2 lb

## 2024-01-06 DIAGNOSIS — M25472 Effusion, left ankle: Secondary | ICD-10-CM

## 2024-01-06 DIAGNOSIS — R600 Localized edema: Secondary | ICD-10-CM | POA: Diagnosis not present

## 2024-01-06 DIAGNOSIS — M25471 Effusion, right ankle: Secondary | ICD-10-CM

## 2024-01-06 NOTE — Patient Instructions (Signed)
 Medication Instructions:  Your physician recommends that you continue on your current medications as directed. Please refer to the Current Medication list given to you today.  *If you need a refill on your cardiac medications before your next appointment, please call your pharmacy*  Lab Work: None ordered today.  Testing/Procedures: Your physician has requested that you have an echocardiogram in the next 1-2 weeks. Echocardiography is a painless test that uses sound waves to create images of your heart. It provides your doctor with information about the size and shape of your heart and how well your heart's chambers and valves are working. This procedure takes approximately one hour. There are no restrictions for this procedure. Please do NOT wear cologne, perfume, aftershave, or lotions (deodorant is allowed). Please arrive 15 minutes prior to your appointment time.  Please note: We ask at that you not bring children with you during ultrasound (echo/ vascular) testing. Due to room size and safety concerns, children are not allowed in the ultrasound rooms during exams. Our front office staff cannot provide observation of children in our lobby area while testing is being conducted. An adult accompanying a patient to their appointment will only be allowed in the ultrasound room at the discretion of the ultrasound technician under special circumstances. We apologize for any inconvenience.  Follow-Up: At [facility], you and your health needs are our priority.  As part of our continuing mission to provide you with exceptional heart care, we have created designated Provider Care Teams.  These Care Teams include your primary Cardiologist (physician) and Advanced Practice Providers (APPs -  Physician Assistants and Nurse Practitioners) who all work together to provide you with the care you need, when you need it.  Your next appointment:   As needed  The format for your next appointment:   In  Person  Provider:   Aleene Passe, MD

## 2024-01-07 ENCOUNTER — Ambulatory Visit: Payer: Medicare Other | Admitting: Physical Therapy

## 2024-01-07 NOTE — H&P (Signed)
 TOTAL KNEE ADMISSION H&P  Patient is being admitted for right total knee arthroplasty.  Subjective:  Chief Complaint: Right knee pain.  HPI: Donna Spears, 68 y.o. female has a history of pain and functional disability in the right knee due to arthritis and has failed non-surgical conservative treatments for greater than 12 weeks to include NSAID's and/or analgesics, corticosteriod injections, and activity modification. Onset of symptoms was gradual, starting  several  years ago with gradually worsening course since that time. The patient noted no past surgery on the right knee.  Patient currently rates pain in the right knee at 8 out of 10 with activity. Patient has night pain, worsening of pain with activity and weight bearing, pain with passive range of motion, and crepitus. Patient has evidence of  bone-on-bone in the patellofemoral compartment of the right knee with medial and lateral narrowing  by imaging studies.. There is no active infection.  Patient Active Problem List   Diagnosis Date Noted   Back pain 11/17/2021   Pain in left knee 03/05/2018   Obesity, unspecified 10/01/2013   Allergic rhinitis 10/01/2013   GE reflux 08/12/2012   Depression 08/12/2012   Hyperlipidemia, mixed 08/12/2012    Past Medical History:  Diagnosis Date   Allergy    Anemia    Asthma    Depression    GERD (gastroesophageal reflux disease)    Hyperlipidemia     No past surgical history on file.  Prior to Admission medications   Medication Sig Start Date End Date Taking? Authorizing Provider  atorvastatin  (LIPITOR) 40 MG tablet TAKE 1 TABLET BY MOUTH DAILY 08/06/23   [name], MD  clobetasol  cream (TEMOVATE ) 0.05 % Apply 1 application topically 2 (two) times daily. Patient taking differently: Apply 1 application  topically as needed. 06/23/19   Stover, Titorya, DPM  fluticasone  (FLONASE ) 50 MCG/ACT nasal spray USE 1 SPRAY IN BOTH NOSTRILS  TWICE DAILY 10/04/23   [name], MD   fluticasone -salmeterol (ADVAIR) 250-50 MCG/ACT AEPB USE 1 INHALATION BY MOUTH EVERY  12 HOURS 05/15/23   [name], MD  furosemide  (LASIX ) 40 MG tablet TAKE 1 TABLET BY MOUTH EVERY DAY 12/11/23   [name], MD  meloxicam  (MOBIC ) 15 MG tablet Take 1 tablet (15 mg total) by mouth daily. 10/22/23   [name], MD  metFORMIN  (GLUCOPHAGE ) 500 MG tablet TAKE 1 TABLET BY MOUTH TWICE  DAILY WITH MEALS 12/20/23   Baxley, Ronal PARAS, MD  Multiple Vitamins-Minerals (ALIVE WOMENS 50+ PO) Take by mouth.    [provider]  pantoprazole  (PROTONIX ) 40 MG tablet TAKE 1 TABLET BY MOUTH DAILY 06/02/23   [name], MD  venlafaxine  (EFFEXOR ) 75 MG tablet TAKE 1 TABLET BY MOUTH DAILY 07/20/23   [name], MD  mometasone -formoterol  (DULERA ) 200-5 MCG/ACT AERO Inhale 2 puffs into the lungs 2 (two) times daily. 05/06/13 09/10/14  Loreli Elyn SAILOR, MD    Allergies  Allergen Reactions   Azithromycin  Nausea And Vomiting   Erythromycin Diarrhea   Other     Social History   Socioeconomic History   Marital status: Married    Spouse name: Not on file   Number of children: Not on file   Years of education: Not on file   Highest education level: Not on file  Occupational History   Not on file  Tobacco Use   Smoking status: Never   Smokeless tobacco: Never  Substance and Sexual Activity   Alcohol use: No  Drug use: No   Sexual activity: Yes    Birth control/protection: None  Other Topics Concern   Not on file  Social History Narrative   Not on file   Social Drivers of Health   Financial Resource Strain: Low Risk  (12/05/2022)   Overall Financial Resource Strain (CARDIA)    Difficulty of Paying Living Expenses: Not hard at all  Food Insecurity: No Food Insecurity (12/05/2022)   Hunger Vital Sign    Worried About Running Out of Food in the Last Year: Never true    Ran Out of Food in the Last Year: Never true  Transportation Needs: No Transportation Needs (12/05/2022)   PRAPARE -  Administrator, Civil Service (Medical): No    Lack of Transportation (Non-Medical): No  Physical Activity: Insufficiently Active (12/05/2022)   Exercise Vital Sign    Days of Exercise per Week: 2 days    Minutes of Exercise per Session: 30 min  Stress: No Stress Concern Present (12/05/2022)   [employer] of Occupational Health - Occupational Stress Questionnaire    Feeling of Stress : Not at all  Social Connections: Moderately Integrated (12/05/2022)   Social Connection and Isolation Panel [NHANES]    Frequency of Communication with Friends and Family: More than three times a week    Frequency of Social Gatherings with Friends and Family: More than three times a week    Attends Religious Services: More than 4 times per year    Active Member of [employer] or Organizations: No    Attends Banker Meetings: Never    Marital Status: Married  Catering Manager Violence: Not At Risk (12/05/2022)   Humiliation, Afraid, Rape, and Kick questionnaire    Fear of Current or Ex-Partner: No    Emotionally Abused: No    Physically Abused: No    Sexually Abused: No    Tobacco Use: Low Risk  (01/06/2024)   Patient History    Smoking Tobacco Use: Never    Smokeless Tobacco Use: Never    Passive Exposure: Not on file   Social History   Substance and Sexual Activity  Alcohol Use No    Family History  Problem Relation Age of Onset   Heart disease Mother    Hypertension Mother    Hyperlipidemia Father    Heart disease Brother    Hypertension Brother     Review of Systems  Constitutional:  Negative for chills and fever.  HENT:  Negative for congestion, sore throat and tinnitus.   Eyes:  Negative for double vision, photophobia and pain.  Respiratory:  Negative for cough, shortness of breath and wheezing.   Cardiovascular:  Negative for chest pain, palpitations and orthopnea.  Gastrointestinal:  Negative for heartburn, nausea and vomiting.  Genitourinary:  Negative  for dysuria, frequency and urgency.  Musculoskeletal:  Positive for joint pain.  Neurological:  Negative for dizziness, weakness and headaches.    Objective:  Physical Exam: Well nourished and well developed.  General: Alert and oriented x3, cooperative and pleasant, no acute distress.  Head: normocephalic, atraumatic, neck supple.  Eyes: EOMI.  Musculoskeletal:  Right Knee Exam: Her right knee shows no effusion.  - Her range of motion is 0-125 degrees with crepitus on range of motion.  - She is tender medially and laterally.  - There is no instability.  - She has mild edema in both ankles.  - There is no gross instability.  Calves soft and nontender. Motor function intact in LE.  Strength 5/5 LE bilaterally. Neuro: Distal pulses 2+. Sensation to light touch intact in LE.  Vital signs in last 24 hours: Pulse Rate:  [92] 92 (01/13 1626) BP: (138)/(86) 138/86 (01/13 1626) SpO2:  [95 %] 95 % (01/13 1626) Weight:  [75.4 kg] 75.4 kg (01/13 1626)  Imaging Review Plain radiographs demonstrate severe degenerative joint disease of the right knee. The overall alignment is mild varus. The bone quality appears to be adequate for age and reported activity level.  Assessment/Plan:  End stage arthritis, right knee   The patient history, physical examination, clinical judgment of the provider and imaging studies are consistent with end stage degenerative joint disease of the right knee and total knee arthroplasty is deemed medically necessary. The treatment options including medical management, injection therapy arthroscopy and arthroplasty were discussed at length. The risks and benefits of total knee arthroplasty were presented and reviewed. The risks due to aseptic loosening, infection, stiffness, patella tracking problems, thromboembolic complications and other imponderables were discussed. The patient acknowledged the explanation, agreed to proceed with the plan and consent was signed.  Patient is being admitted for inpatient treatment for surgery, pain control, PT, OT, prophylactic antibiotics, VTE prophylaxis, progressive ambulation and ADLs and discharge planning. The patient is planning to be discharged  home .   Patient's anticipated LOS is less than 2 midnights, meeting these requirements: - Lives within 1 hour of care - Has a competent adult at home to recover with post-op recover - NO history of  - Chronic pain requiring opioids  - Diabetes  - Coronary Artery Disease  - Heart failure  - Heart attack  - Stroke  - DVT/VTE  - Cardiac arrhythmia  - Respiratory Failure/COPD  - Renal failure  - Anemia  - Advanced Liver disease  Therapy Plans: Outpatient therapy at [facility]  Disposition: Home with hired help at home Planned DVT Prophylaxis: Aspirin  81 mg BID DME Needed: Vannie PCP: Ronal Hailstone, MD (referred for cardiac clearance) Cardiologist: Aleene Passe, MD (appt 01/06/24) TXA: IV Allergies: NKDA Metal Allergy: None Anesthesia Concerns: None BMI: 32.1 Last HgbA1c: 6.8% (11/24) Pain Regimen: Oxycodone  Pharmacy: [facility] (have brought to room)   - Patient was instructed on what medications to stop prior to surgery. - Follow-up visit in 2 weeks with Dr. Melodi - Begin physical therapy following surgery - Pre-operative lab work as pre-surgical testing - Prescriptions will be provided in hospital at time of discharge  Roxie Mess, PA-C Orthopedic Surgery [facility] [place]

## 2024-01-09 ENCOUNTER — Ambulatory Visit: Payer: Medicare Other | Admitting: Physical Therapy

## 2024-01-09 ENCOUNTER — Encounter: Payer: Self-pay | Admitting: Physical Therapy

## 2024-01-09 DIAGNOSIS — R262 Difficulty in walking, not elsewhere classified: Secondary | ICD-10-CM

## 2024-01-09 DIAGNOSIS — R278 Other lack of coordination: Secondary | ICD-10-CM

## 2024-01-09 DIAGNOSIS — M170 Bilateral primary osteoarthritis of knee: Secondary | ICD-10-CM

## 2024-01-09 DIAGNOSIS — R2681 Unsteadiness on feet: Secondary | ICD-10-CM

## 2024-01-09 DIAGNOSIS — M6281 Muscle weakness (generalized): Secondary | ICD-10-CM

## 2024-01-09 NOTE — Therapy (Signed)
OUTPATIENT PHYSICAL THERAPY LOWER EXTREMITY EVALUATION  PHYSICAL THERAPY DISCHARGE SUMMARY  Visits from Start of Care: 5  Current functional level related to goals / functional outcomes: All goals met   Remaining deficits: L knee degenerative changes, pain, weakness   Education / Equipment: HEP   Patient agrees to discharge. Patient goals were met. Patient is being discharged due to being pleased with the current functional level.   Patient Name: Donna Spears MRN: [id number] DOB:04-24-1956, 68 y.o., female Today's Date: 01/09/2024  END OF SESSION:  PT End of Session - 01/09/24 1320     Visit Number 5    Date for PT Re-Evaluation 02/06/24    PT Start Time 1312    PT Stop Time 1355    PT Time Calculation (min) 43 min    Activity Tolerance Patient limited by pain    Behavior During Therapy [facility] for tasks assessed/performed            Past Medical History:  Diagnosis Date   Allergy    Anemia    Asthma    Depression    GERD (gastroesophageal reflux disease)    Hyperlipidemia    History reviewed. No pertinent surgical history. Patient Active Problem List   Diagnosis Date Noted   Back pain 11/17/2021   Pain in left knee 03/05/2018   Obesity, unspecified 10/01/2013   Allergic rhinitis 10/01/2013   GE reflux 08/12/2012   Depression 08/12/2012   Hyperlipidemia, mixed 08/12/2012    PCP: Margaree Mackintosh, MD   REFERRING PROVIDER: Ollen Gross, MD   REFERRING DIAG: M17.0 (ICD-10-CM) - Primary osteoarthritis of both knees   THERAPY DIAG:  Primary osteoarthritis of both knees  Other lack of coordination  Muscle weakness (generalized)  Difficulty in walking, not elsewhere classified  Unsteadiness on feet  Rationale for Evaluation and Treatment: Rehabilitation  ONSET DATE: 12/09/23  SUBJECTIVE:   SUBJECTIVE STATEMENT: Patient reports no problems after last treatment.  Eval-Patient reports that she has started taking Lasix due to her increased  swelling. She arrives for PT to increase her leg strength in preparation for R TKR in Feb. Her husband is disabled, has been seen here before for therapy.  PERTINENT HISTORY: Per referring physician note: Dr reports bone on bone OA of R knee, B foot and lower leg swelling. She has developed weakness due to pain, ROM 0-125 with crepitus. TKR planned for Feb and patient referred to primary Dr for the swelling issue. Pre-op PT for general strengthening in preparation for TKR in Feb. Husband disabled and cannot walk PAIN:  Are you having pain? Yes: NPRS scale: 4/10 Pain location: both knees Pain description: burning Aggravating factors: Any movement, walking Relieving factors: nothing is effective  PRECAUTIONS: Knee and Fall  RED FLAGS: None   WEIGHT BEARING RESTRICTIONS: No  FALLS:  Has patient fallen in last 6 months? No  LIVING ENVIRONMENT: Lives with: lives with their family Lives in: House/apartment Stairs: No Has following equipment at home: None  OCCUPATION: not working. Likes to garden, does lawn care, all indoor housework.  PLOF: Independent  PATIENT GOALS: patient would like to get ready for surgery  NEXT MD VISIT: Mid January  OBJECTIVE:  Note: Objective measures were completed at Evaluation unless otherwise noted.  DIAGNOSTIC FINDINGS:  Per report X rays reveal bone on bone in R knee.  COGNITION: Overall cognitive status: Within functional limits for tasks assessed     SENSATION: Not tested  EDEMA:  B Lower leg edema, patient has started Lasix  MUSCLE LENGTH: Hamstrings: WNL B   POSTURE: rounded shoulders and left pelvic obliquity  PALPATION: B knees edemetous, TTP  LOWER EXTREMITY ROM: WNL except as noted  Active ROM Right eval Left eval  Hip flexion    Hip extension    Hip abduction    Hip adduction    Hip internal rotation    Hip external rotation    Knee flexion 113 118  Knee extension -20 -5  Ankle dorsiflexion    Ankle  plantarflexion    Ankle inversion    Ankle eversion     (Blank rows = not tested)  LOWER EXTREMITY MMT: R LE 4+/5 MMT, functional weakness noted with difficulty in sit to stand and gait  FUNCTIONAL TESTS:  30 seconds chair stand test Timed up and go (TUG): 15.3 sec, stiff legs, increased lat sway to advance each leg 2 minute walk test: 200' 5x STS from elevated surface 17.2, used hands. Could not perform from regular height.  GAIT: Distance walked: I Assistive device utilized: None Level of assistance: Modified independence Comments: Upon entering gym, patient had great difficulty walking, with increased lateral trunk sway, antalgic steps B, slow and very effortful, unsteady. This did improve somewhat after assessment which involved joint movement and muscle activation, but still impaired.   TODAY'S TREATMENT:                                                                                                                              DATE:  01/09/24 NuStep L5 x 6 minutes Reviewed all HEP exercises, removed several, demonstrated some to ensure she understands them. Added some upper body stabilization exercises including seated press ups, shoulder ext, rows, ER against R tband. Reviewed goals for D/C  12/31/23 NuStep L3 x 5 minutes. Reviewed, updated HEP, see program for all exercises performed.  12/27/23 NuStep L3 x 5 minutes Supine with BLE over physioball- DKTC, bridge, bridge with B hip IR, bridge with ball roll side to side x 10 each L sidelying hip abd x 10 reps Prone hip ext with knee ext and then with knee flex, TKE, 10 each Standing TKE against ball on wall x 10 VASO med pressure, 10 min to R knee for pain and inflammation.  12/23/23 NuStep L3 x 6 minutes Supine SLR on R x 10, easy, repeat with hip in ER, much more challenging, Quad set with heel elevated to promote extension ROM 5 sec holds x 10 Supine PROM for R knee ext, 4 x 5 sec holds Supine SAQ with light yellow  Tband resistance 10 reps Clamshells against G tband resistance, x 10 reps  12/12/23 Education    PATIENT EDUCATION:  Education details: POC Person educated: Patient Education method: Explanation Education comprehension: verbalized understanding  HOME EXERCISE PROGRAM: Has general TKR exercise program from Dr. Catalina Pizza to continue this until next appointment and will update. [id number]-unable to open it in our system- updated program 12/23/23 12/31/23- current HEP, SLR, SLE  with Hip ER, SAQ, Quad set with heel elevated, bridge, pelvic tilt, Seated long kick with pulses,  ASSESSMENT:  CLINICAL IMPRESSION: Reviewed HEP and updated with upper body stability exercises. Reviewed goals, answered all questions. D/C PT services. She will perform HEP until TKR surgery on 01/27/24  Eval-Patient is a 68 y.o. who was seen today for physical therapy evaluation and treatment for end stage OA of R knee. TKR scheduled for Feb. Referred today for pre-op strengthening. She demonstrates BLE weakness and knee pain, with stiffness, impaired balance, impaired safety and I with all functional mobility. She reports severe limitations in her normal daily activities. She is aware that PT will not eliminate all pain, but she would greatly benefit from PT to maximize her ROM and strength in not only her B knees, but ankles, hips, and trunk as well. She will benefit from Upper body strengthening as well in preparation for using a RW after surgery.   OBJECTIVE IMPAIRMENTS: Abnormal gait, decreased activity tolerance, decreased balance, decreased coordination, decreased endurance, difficulty walking, decreased ROM, decreased strength, increased edema, impaired flexibility, and pain.   ACTIVITY LIMITATIONS: bending, sitting, standing, squatting, stairs, transfers, and locomotion level  PARTICIPATION LIMITATIONS: meal prep, cleaning, laundry, shopping, and community activity  PERSONAL FACTORS: Past/current experiences are also  affecting patient's functional outcome.   REHAB POTENTIAL: Good  CLINICAL DECISION MAKING: Stable/uncomplicated  EVALUATION COMPLEXITY: Low   GOALS: Goals reviewed with patient? Yes  SHORT TERM GOALS: Target date: 12/29/23 I with initial HEP Baseline: Goal status: 12/27/23-met  LONG TERM GOALS: Target date: 02/06/24  I with final HEP Baseline:  Goal status: 01/09/24-met  2.  5 x STS in < 12 sec, stable, no UE support Baseline:  Goal status: met  3.  TUG in < 12 sec, no unsteadiness Baseline:  Goal status: met  4.  Ambulate x at least 300' in 2 min walk test, no gait deviations or antalgic pattern. Baseline:  Goal status: 01/09/24-Patient reports walking x 10 minutes straight at home, met.  PLAN:  PT FREQUENCY: 2x/week  PT DURATION: 10 weeks  PLANNED INTERVENTIONS: 97110-Therapeutic exercises, 97530- Therapeutic activity, 97112- Neuromuscular re-education, 97535- Self Care, 40981- Manual therapy, [id number]- Gait training, 97014- Electrical stimulation (unattended), 97016- Vasopneumatic device, Patient/Family education, Balance training, Stair training, Joint mobilization, Cryotherapy, and Moist heat  PLAN FOR NEXT SESSION: 01/09/24-last visit, check HEP   Iona Beard, DPT 01/09/2024, 1:55 PM

## 2024-01-10 ENCOUNTER — Encounter (HOSPITAL_COMMUNITY): Payer: Self-pay

## 2024-01-10 ENCOUNTER — Encounter: Payer: Self-pay | Admitting: Internal Medicine

## 2024-01-10 ENCOUNTER — Ambulatory Visit (INDEPENDENT_AMBULATORY_CARE_PROVIDER_SITE_OTHER): Payer: Medicare Other | Admitting: Internal Medicine

## 2024-01-10 VITALS — BP 120/80 | HR 88 | Ht 63.0 in

## 2024-01-10 DIAGNOSIS — M25811 Other specified joint disorders, right shoulder: Secondary | ICD-10-CM

## 2024-01-10 DIAGNOSIS — M19042 Primary osteoarthritis, left hand: Secondary | ICD-10-CM

## 2024-01-10 DIAGNOSIS — M19041 Primary osteoarthritis, right hand: Secondary | ICD-10-CM

## 2024-01-10 DIAGNOSIS — M26622 Arthralgia of left temporomandibular joint: Secondary | ICD-10-CM | POA: Diagnosis not present

## 2024-01-10 NOTE — Progress Notes (Signed)
Patient Care Team: Margaree Mackintosh, MD as PCP - General (Internal Medicine) Nahser, Deloris Ping, MD as PCP - Cardiology (Cardiology)  Visit Date: 01/10/24  Subjective:   Chief Complaint  Patient presents with   Arm Pain    Right arm   Patient SW:FUXNA Furrow,Female DOB:11/11/1956,68 y.o. TFT:[id number]   68 y.o. Female presents today for acute visit with Right Arm Pain. Endorses reduced range of motion. 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 She is also having difficulty fully opening her hands, and has been having some left-sided TMJ pain.   Labs Reviewed from 12/13/23:  Cyclic Citrullin Peptide Ab <70 Anti Nuclear Antibody POSITIVE Sed Rate 58 Uric Acid, Serum 4.0  Rheumatoid fact SerPl-aCnc 14 Past Medical History:  Diagnosis Date   Allergy    Anemia    Asthma    Depression    Diabetes mellitus without complication (HCC)    GERD (gastroesophageal reflux disease)    Hyperlipidemia     Family History  Problem Relation Age of Onset   Heart disease Mother    Hypertension Mother    Hyperlipidemia Father    Heart disease Brother    Hypertension Brother    Social Hx: reviewed and unchanged  Review of Systems  Constitutional:  Negative for fever and malaise/fatigue.  HENT:  Negative for congestion.   Eyes:  Negative for blurred vision.  Respiratory:  Negative for cough and shortness of breath.   Cardiovascular:  Negative for chest pain, palpitations and leg swelling.  Gastrointestinal:  Negative for vomiting.  Musculoskeletal:  Negative for back pain.       (+) Reduced ROM & Right Upper Arm Pain (+) Left-sided Jaw Pain  Skin:  Negative for rash.  Neurological:  Negative for loss of consciousness and headaches.     Objective:  Vitals: BP 120/80   Pulse 88   Ht 5\' 3"  (1.6 m)   SpO2 98%   BMI 29.44 kg/m   Physical Exam Vitals and nursing note reviewed.  Constitutional:      General: She is not in acute distress.    Appearance: Normal appearance. She  is not toxic-appearing.  HENT:     Head: Normocephalic and atraumatic.  Pulmonary:     Effort: Pulmonary effort is normal.  Musculoskeletal:     Comments:  Tenderness on palpation to upper right arm Reduced ROM noted lifting up above head  Skin:    General: Skin is warm and dry.  Neurological:     Mental Status: She is alert and oriented to person, place, and time. Mental status is at baseline.  Psychiatric:        Mood and Affect: Mood normal.        Behavior: Behavior normal.        Thought Content: Thought content normal.        Judgment: Judgment normal.     Results:  Studies Obtained And Personally Reviewed By Me: Labs:     Component Value Date/Time   NA 143 12/13/2023 1108   K 4.5 12/13/2023 1108   CL 102 12/13/2023 1108   CO2 28 12/13/2023 1108   GLUCOSE 135 (H) 12/13/2023 1108   BUN 12 12/13/2023 1108   CREATININE 0.46 (L) 12/13/2023 1108   CALCIUM 9.2 12/13/2023 1108   PROT 7.0 12/13/2023 1108   ALBUMIN 3.9 01/17/2017 1023   AST 13 12/13/2023 1108   ALT 16 12/13/2023 1108   ALKPHOS 87 01/17/2017 1023   BILITOT 0.5 12/13/2023  1108   GFRNONAA 95 09/19/2020 1116   GFRAA 110 09/19/2020 1116    Lab Results  Component Value Date   WBC 6.3 11/07/2023   HGB 11.2 (L) 11/07/2023   HCT 35.5 11/07/2023   MCV 86.4 11/07/2023   PLT 355 11/07/2023   Lab Results  Component Value Date   CHOL 184 03/14/2023   HDL 54 03/14/2023   LDLCALC 104 (H) 03/14/2023   TRIG 147 03/14/2023   CHOLHDL 3.4 03/14/2023   Lab Results  Component Value Date   HGBA1C 6.8 (H) 11/07/2023    Lab Results  Component Value Date   TSH 1.64 11/07/2023   Assessment & Plan:  Frozen Shoulder: Right arm, possibly related to being the primary caregiver for her disabled husband. Offered Depo-Medrol injection for pain relief - patient declined. Instructed on swinging exercises with 1 pound weight. Discussed physical therapy - patient would like to wait until after her procedure in February and  will revisit if necessary.   Addressed her concerns for Left-sided TMJ Pain. Suspect she probably grinds her teeth unknowingly.  Reviewed her labs from 12/13/23 (Cyclic Citrullin Peptide Ab, Anti Nuclear Antibody, Sed Rate, Uric Acid, Serum, Rheumatoid fact SerPl-aCnc). Patient expressed concern about Joint Stiffness in Hands Bilaterally causing difficulty with flexion.         I,Emily Lagle,acting as a Neurosurgeon for Margaree Mackintosh, MD.,have documented all relevant documentation on the behalf of Margaree Mackintosh, MD,as directed by  Margaree Mackintosh, MD while in the presence of Margaree Mackintosh, MD.   I, Margaree Mackintosh, MD, have reviewed all documentation for this visit. The documentation on 01/24/24 for the exam, diagnosis, procedures, and orders are all accurate and complete.

## 2024-01-10 NOTE — Progress Notes (Signed)
COVID Vaccine Completed:  Yes  Date of COVID positive in last 90 days:  PCP - Marlan Palau, MD Cardiologist - Kristeen Miss, MD  Chest x-ray -  EKG - 01-06-24 Epic Stress Test -  ECHO - Scheduled for 01-14-24 Cardiac Cath -  Pacemaker/ICD device last checked: Spinal Cord Stimulator:  Bowel Prep -   Sleep Study -  CPAP -   Last A1c is 6.8 Fasting Blood Sugar -  Checks Blood Sugar _____ times a day  Last dose of GLP1 agonist-  N/A GLP1 instructions:  Hold 7 days before surgery    Last dose of SGLT-2 inhibitors-  N/A SGLT-2 instructions:  Hold 3 days before surgery    Blood Thinner Instructions:  Time Aspirin Instructions: Last Dose:  Activity level:  Can go up a flight of stairs and perform activities of daily living without stopping and without symptoms of chest pain or shortness of breath.  Able to exercise without symptoms  Unable to go up a flight of stairs without symptoms of     Anesthesia review:  Evaluated by cardiology for leg swelling, waiting for echo for clearance  Patient denies shortness of breath, fever, cough and chest pain at PAT appointment  Patient verbalized understanding of instructions that were given to them at the PAT appointment. Patient was also instructed that they will need to review over the PAT instructions again at home before surgery.

## 2024-01-10 NOTE — Patient Instructions (Addendum)
SURGICAL WAITING ROOM VISITATION Patients having surgery or a procedure may have no more than 2 support people in the waiting area - these visitors may rotate.    Children under the age of 53 must have an adult with them who is not the patient.  Due to an increase in RSV and influenza rates and associated hospitalizations, children ages 96 and under may not visit patients in [facility] hospitals.   If the patient needs to stay at the hospital during part of their recovery, the visitor guidelines for inpatient rooms apply. Pre-op nurse will coordinate an appropriate time for 1 support person to accompany patient in pre-op.  This support person may not rotate.    Please refer to the [facility] website for the visitor guidelines for Inpatients (after your surgery is over and you are in a regular room).       Your procedure is scheduled on: 01-27-24   Report to [facility] Main Entrance    Report to admitting at 5:15 AM   Call this number if you have problems the morning of surgery [phone]   Do not eat food :After Midnight.   After Midnight you may have the following liquids until 4:15 AM DAY OF SURGERY  Water Non-Citrus Juices (without pulp, NO RED-Apple, White grape, White cranberry) Black Coffee (NO MILK/CREAM OR CREAMERS, sugar ok)  Clear Tea (NO MILK/CREAM OR CREAMERS, sugar ok) regular and decaf                             Plain Jell-O (NO RED)                                           Fruit ices (not with fruit pulp, NO RED)                                     Popsicles (NO RED)                                                               Sports drinks like Gatorade (NO RED)                   The day of surgery:  Drink ONE (1) Pre-Surgery  G2 by 4:15 AM the morning of surgery. Drink in one sitting. Do not sip.  This drink was given to you during your hospital  pre-op appointment visit. Nothing else to drink after completing the Pre-Surgery G2.          If  you have questions, please contact your surgeon's office.   FOLLOW  ANY ADDITIONAL PRE OP INSTRUCTIONS YOU RECEIVED FROM YOUR SURGEON'S OFFICE!!!     Oral Hygiene is also important to reduce your risk of infection.                                    Remember - BRUSH YOUR TEETH THE MORNING OF SURGERY WITH YOUR REGULAR TOOTHPASTE  Do NOT smoke after Midnight   Take these medicines the morning of surgery with A SIP OF WATER:   None  Okay to use inhalers and nasal spray  Stop all vitamins and herbal supplements 7 days before surgery  DO NOT TAKE ANY ORAL DIABETIC MEDICATIONS DAY OF YOUR SURGERY (do not take Metformin the morning of surgery)  Bring CPAP mask and tubing day of surgery.                              You may not have any metal on your body including hair pins, jewelry, and body piercing             Do not wear make-up, lotions, powders, perfumes or deodorant  Do not wear nail polish including gel and S&S, artificial/acrylic nails, or any other type of covering on natural nails including finger and toenails. If you have artificial nails, gel coating, etc. that needs to be removed by a nail salon please have this removed prior to surgery or surgery may need to be canceled/ delayed if the surgeon/ anesthesia feels like they are unable to be safely monitored.   Do not shave  48 hours prior to surgery.      Do not bring valuables to the hospital. [facility] IS NOT RESPONSIBLE   FOR VALUABLES.   Contacts, dentures or bridgework may not be worn into surgery.   Bring small overnight bag day of surgery.   DO NOT BRING YOUR HOME MEDICATIONS TO THE HOSPITAL. PHARMACY WILL DISPENSE MEDICATIONS LISTED ON YOUR MEDICATION LIST TO YOU DURING YOUR ADMISSION IN THE HOSPITAL!   Special Instructions: Bring a copy of your healthcare power of attorney and living will documents the day of surgery if you haven't scanned them before.              Please read over the following fact sheets  you were given: IF YOU HAVE QUESTIONS ABOUT YOUR PRE-OP INSTRUCTIONS PLEASE CALL [phone] Gwen  If you received a COVID test during your pre-op visit  it is requested that you wear a mask when out in public, stay away from anyone that may not be feeling well and notify your surgeon if you develop symptoms. If you test positive for Covid or have been in contact with anyone that has tested positive in the last 10 days please notify you surgeon.    Pre-operative 5 CHG Bath Instructions   You can play a key role in reducing the risk of infection after surgery. Your skin needs to be as free of germs as possible. You can reduce the number of germs on your skin by washing with CHG (chlorhexidine gluconate) soap before surgery. CHG is an antiseptic soap that kills germs and continues to kill germs even after washing.   DO NOT use if you have an allergy to chlorhexidine/CHG or antibacterial soaps. If your skin becomes reddened or irritated, stop using the CHG and notify one of our RNs at [phone].   Please shower with the CHG soap starting 4 days before surgery using the following schedule:     Please keep in mind the following:  DO NOT shave, including legs and underarms, starting the day of your first shower.   You may shave your face at any point before/day of surgery.  Place clean sheets on your bed the day you start using CHG soap. Use a clean washcloth (not used since being washed)  for each shower. DO NOT sleep with pets once you start using the CHG.   CHG Shower Instructions:  If you choose to wash your hair and private area, wash first with your normal shampoo/soap.  After you use shampoo/soap, rinse your hair and body thoroughly to remove shampoo/soap residue.  Turn the water OFF and apply about 3 tablespoons (45 ml) of CHG soap to a CLEAN washcloth.  Apply CHG soap ONLY FROM YOUR NECK DOWN TO YOUR TOES (washing for 3-5 minutes)  DO NOT use CHG soap on face, private areas, open  wounds, or sores.  Pay special attention to the area where your surgery is being performed.  If you are having back surgery, having someone wash your back for you may be helpful. Wait 2 minutes after CHG soap is applied, then you may rinse off the CHG soap.  Pat dry with a clean towel  Put on clean clothes/pajamas   If you choose to wear lotion, please use ONLY the CHG-compatible lotions on the back of this paper.     Additional instructions for the day of surgery: DO NOT APPLY any lotions, deodorants, cologne, or perfumes.   Put on clean/comfortable clothes.  Brush your teeth.  Ask your nurse before applying any prescription medications to the skin.      CHG Compatible Lotions   Aveeno Moisturizing lotion  Cetaphil Moisturizing Cream  Cetaphil Moisturizing Lotion  Clairol Herbal Essence Moisturizing Lotion, Dry Skin  Clairol Herbal Essence Moisturizing Lotion, Extra Dry Skin  Clairol Herbal Essence Moisturizing Lotion, Normal Skin  Curel Age Defying Therapeutic Moisturizing Lotion with Alpha Hydroxy  Curel Extreme Care Body Lotion  Curel Soothing Hands Moisturizing Hand Lotion  Curel Therapeutic Moisturizing Cream, Fragrance-Free  Curel Therapeutic Moisturizing Lotion, Fragrance-Free  Curel Therapeutic Moisturizing Lotion, Original Formula  Eucerin Daily Replenishing Lotion  Eucerin Dry Skin Therapy Plus Alpha Hydroxy Crme  Eucerin Dry Skin Therapy Plus Alpha Hydroxy Lotion  Eucerin Original Crme  Eucerin Original Lotion  Eucerin Plus Crme Eucerin Plus Lotion  Eucerin TriLipid Replenishing Lotion  Keri Anti-Bacterial Hand Lotion  Keri Deep Conditioning Original Lotion Dry Skin Formula Softly Scented  Keri Deep Conditioning Original Lotion, Fragrance Free Sensitive Skin Formula  Keri Lotion Fast Absorbing Fragrance Free Sensitive Skin Formula  Keri Lotion Fast Absorbing Softly Scented Dry Skin Formula  Keri Original Lotion  Keri Skin Renewal Lotion Keri Silky Smooth  Lotion  Keri Silky Smooth Sensitive Skin Lotion  Nivea Body Creamy Conditioning Oil  Nivea Body Extra Enriched Lotion  Nivea Body Original Lotion  Nivea Body Sheer Moisturizing Lotion Nivea Crme  Nivea Skin Firming Lotion  NutraDerm 30 Skin Lotion  NutraDerm Skin Lotion  NutraDerm Therapeutic Skin Cream  NutraDerm Therapeutic Skin Lotion  ProShield Protective Hand Cream  Provon moisturizing lotion   PATIENT SIGNATURE_________________________________  NURSE SIGNATURE__________________________________  ________________________________________________________________________    Rogelia Mire  An incentive spirometer is a tool that can help keep your lungs clear and active. This tool measures how well you are filling your lungs with each breath. Taking long deep breaths may help reverse or decrease the chance of developing breathing (pulmonary) problems (especially infection) following: A long period of time when you are unable to move or be active. BEFORE THE PROCEDURE  If the spirometer includes an indicator to show your best effort, your nurse or respiratory therapist will set it to a desired goal. If possible, sit up straight or lean slightly forward. Try not to slouch. Hold the incentive spirometer in an  upright position. INSTRUCTIONS FOR USE  Sit on the edge of your bed if possible, or sit up as far as you can in bed or on a chair. Hold the incentive spirometer in an upright position. Breathe out normally. Place the mouthpiece in your mouth and seal your lips tightly around it. Breathe in slowly and as deeply as possible, raising the piston or the ball toward the top of the column. Hold your breath for 3-5 seconds or for as long as possible. Allow the piston or ball to fall to the bottom of the column. Remove the mouthpiece from your mouth and breathe out normally. Rest for a few seconds and repeat Steps 1 through 7 at least 10 times every 1-2 hours when you are awake.  Take your time and take a few normal breaths between deep breaths. The spirometer may include an indicator to show your best effort. Use the indicator as a goal to work toward during each repetition. After each set of 10 deep breaths, practice coughing to be sure your lungs are clear. If you have an incision (the cut made at the time of surgery), support your incision when coughing by placing a pillow or rolled up towels firmly against it. Once you are able to get out of bed, walk around indoors and cough well. You may stop using the incentive spirometer when instructed by your caregiver.  RISKS AND COMPLICATIONS Take your time so you do not get dizzy or light-headed. If you are in pain, you may need to take or ask for pain medication before doing incentive spirometry. It is harder to take a deep breath if you are having pain. AFTER USE Rest and breathe slowly and easily. It can be helpful to keep track of a log of your progress. Your caregiver can provide you with a simple table to help with this. If you are using the spirometer at home, follow these instructions: SEEK MEDICAL CARE IF:  You are having difficultly using the spirometer. You have trouble using the spirometer as often as instructed. Your pain medication is not giving enough relief while using the spirometer. You develop fever of 100.5 F (38.1 C) or higher. SEEK IMMEDIATE MEDICAL CARE IF:  You cough up bloody sputum that had not been present before. You develop fever of 102 F (38.9 C) or greater. You develop worsening pain at or near the incision site. MAKE SURE YOU:  Understand these instructions. Will watch your condition. Will get help right away if you are not doing well or get worse. Document Released: 04/22/2007 Document Revised: 03/03/2012 Document Reviewed: 06/23/2007 [facility] Patient Information 2014 [place], [place].   ________________________________________________________________________

## 2024-01-14 ENCOUNTER — Encounter: Payer: Self-pay | Admitting: Cardiovascular Disease

## 2024-01-14 ENCOUNTER — Ambulatory Visit: Payer: Medicare Other | Admitting: Physical Therapy

## 2024-01-14 ENCOUNTER — Ambulatory Visit (HOSPITAL_COMMUNITY): Payer: Medicare Other | Attending: Cardiology

## 2024-01-14 DIAGNOSIS — E785 Hyperlipidemia, unspecified: Secondary | ICD-10-CM | POA: Diagnosis not present

## 2024-01-14 DIAGNOSIS — M25472 Effusion, left ankle: Secondary | ICD-10-CM | POA: Insufficient documentation

## 2024-01-14 DIAGNOSIS — I358 Other nonrheumatic aortic valve disorders: Secondary | ICD-10-CM | POA: Insufficient documentation

## 2024-01-14 DIAGNOSIS — I5189 Other ill-defined heart diseases: Secondary | ICD-10-CM | POA: Insufficient documentation

## 2024-01-14 DIAGNOSIS — M25471 Effusion, right ankle: Secondary | ICD-10-CM | POA: Diagnosis present

## 2024-01-14 DIAGNOSIS — E119 Type 2 diabetes mellitus without complications: Secondary | ICD-10-CM | POA: Insufficient documentation

## 2024-01-14 DIAGNOSIS — R600 Localized edema: Secondary | ICD-10-CM

## 2024-01-14 LAB — ECHOCARDIOGRAM COMPLETE
Area-P 1/2: 2.91 cm2
Est EF: 65
S' Lateral: 2.2 cm

## 2024-01-15 ENCOUNTER — Encounter (HOSPITAL_COMMUNITY)
Admission: RE | Admit: 2024-01-15 | Discharge: 2024-01-15 | Disposition: A | Discharge status: Home / Self Care | Payer: Medicare Other | Source: Ambulatory Visit | Attending: Orthopedic Surgery | Admitting: Orthopedic Surgery

## 2024-01-15 ENCOUNTER — Other Ambulatory Visit: Payer: Self-pay

## 2024-01-15 ENCOUNTER — Encounter (HOSPITAL_COMMUNITY): Payer: Self-pay

## 2024-01-15 VITALS — Ht 64.0 in | Wt 165.0 lb

## 2024-01-15 DIAGNOSIS — E119 Type 2 diabetes mellitus without complications: Secondary | ICD-10-CM

## 2024-01-15 DIAGNOSIS — Z01818 Encounter for other preprocedural examination: Secondary | ICD-10-CM

## 2024-01-15 HISTORY — DX: Type 2 diabetes mellitus without complications: E11.9

## 2024-01-15 HISTORY — DX: Unspecified osteoarthritis, unspecified site: M19.90

## 2024-01-15 HISTORY — DX: Anxiety disorder, unspecified: F41.9

## 2024-01-16 ENCOUNTER — Ambulatory Visit: Payer: Medicare Other | Admitting: Physical Therapy

## 2024-01-20 ENCOUNTER — Encounter (HOSPITAL_COMMUNITY)
Admission: RE | Admit: 2024-01-20 | Discharge: 2024-01-20 | Disposition: A | Discharge status: Home / Self Care | Payer: Medicare Other | Source: Ambulatory Visit | Attending: Orthopedic Surgery

## 2024-01-20 DIAGNOSIS — Z01812 Encounter for preprocedural laboratory examination: Secondary | ICD-10-CM | POA: Diagnosis present

## 2024-01-20 DIAGNOSIS — Z01818 Encounter for other preprocedural examination: Secondary | ICD-10-CM

## 2024-01-20 DIAGNOSIS — E119 Type 2 diabetes mellitus without complications: Secondary | ICD-10-CM | POA: Diagnosis not present

## 2024-01-20 LAB — CBC
HCT: 34.7 % — ABNORMAL LOW (ref 36.0–46.0)
Hemoglobin: 10.8 g/dL — ABNORMAL LOW (ref 12.0–15.0)
MCH: 26.2 pg (ref 26.0–34.0)
MCHC: 31.1 g/dL (ref 30.0–36.0)
MCV: 84.2 fL (ref 80.0–100.0)
Platelets: 348 10*3/uL (ref 150–400)
RBC: 4.12 MIL/uL (ref 3.87–5.11)
RDW: 15.2 % (ref 11.5–15.5)
WBC: 9.2 10*3/uL (ref 4.0–10.5)
nRBC: 0 % (ref 0.0–0.2)

## 2024-01-20 LAB — BASIC METABOLIC PANEL
Anion gap: 12 (ref 5–15)
BUN: 14 mg/dL (ref 8–23)
CO2: 24 mmol/L (ref 22–32)
Calcium: 9.3 mg/dL (ref 8.9–10.3)
Chloride: 104 mmol/L (ref 98–111)
Creatinine, Ser: 0.33 mg/dL — ABNORMAL LOW (ref 0.44–1.00)
GFR, Estimated: >60 mL/min (ref >60)
Glucose, Bld: 145 mg/dL — ABNORMAL HIGH (ref 70–99)
Potassium: 3.5 mmol/L (ref 3.5–5.1)
Sodium: 140 mmol/L (ref 135–145)

## 2024-01-20 LAB — GLUCOSE, CAPILLARY: Glucose-Capillary: 130 mg/dL — ABNORMAL HIGH (ref 70–99)

## 2024-01-20 LAB — SURGICAL PCR SCREEN
MRSA, PCR: NEGATIVE
Staphylococcus aureus: NEGATIVE

## 2024-01-20 LAB — HEMOGLOBIN A1C
Hgb A1c MFr Bld: 6.4 % — ABNORMAL HIGH (ref 4.8–5.6)
Mean Plasma Glucose: 136.98 mg/dL

## 2024-01-21 ENCOUNTER — Ambulatory Visit: Payer: Medicare Other | Admitting: Physical Therapy

## 2024-01-21 NOTE — Anesthesia Preprocedure Evaluation (Addendum)
Anesthesia Evaluation  Patient identified by MRN, date of birth, ID band Patient awake    Reviewed: Allergy & Precautions, H&P , NPO status , Patient's Chart, lab work & pertinent test results  Airway Mallampati: IV  TM Distance: >3 FB Neck ROM: Full    Dental no notable dental hx. (+) Teeth Intact, Dental Advisory Given   Pulmonary asthma    Pulmonary exam normal breath sounds clear to auscultation       Cardiovascular Exercise Tolerance: Good negative cardio ROS  Rhythm:Regular Rate:Normal     Neuro/Psych   Anxiety Depression    negative neurological ROS     GI/Hepatic Neg liver ROS,GERD  Medicated,,  Endo/Other  diabetes, Type 2, Oral Hypoglycemic Agents    Renal/GU negative Renal ROS  negative genitourinary   Musculoskeletal  (+) Arthritis , Osteoarthritis,    Abdominal   Peds  Hematology  (+) Blood dyscrasia, anemia   Anesthesia Other Findings   Reproductive/Obstetrics negative OB ROS                             Anesthesia Physical Anesthesia Plan  ASA: 2  Anesthesia Plan: Spinal   Post-op Pain Management: Regional block* and Ofirmev IV (intra-op)*   Induction: Intravenous  PONV Risk Score and Plan: 3 and Ondansetron, Dexamethasone and Propofol infusion  Airway Management Planned: Natural Airway and Simple Face Mask  Additional Equipment:   Intra-op Plan:   Post-operative Plan:   Informed Consent: I have reviewed the patients History and Physical, chart, labs and discussed the procedure including the risks, benefits and alternatives for the proposed anesthesia with the patient or authorized representative who has indicated his/her understanding and acceptance.     Dental advisory given  Plan Discussed with: CRNA  Anesthesia Plan Comments: (See PAT note from 1/22 by Sherlie Ban PA-C )        Anesthesia Quick Evaluation

## 2024-01-21 NOTE — Progress Notes (Signed)
Case: [id number] Date/Time: 01/27/24 0700   Procedure: TOTAL KNEE ARTHROPLASTY (Right: Knee)   Anesthesia type: Choice   Pre-op diagnosis: right knee osteoarthritis   Location: [facility] ROOM 09 / [facility]   Surgeons: Ollen Gross, MD       DISCUSSION: Donna Spears is a 68 year old female who presents to PAT prior to surgery above.  Past medical history significant for asthma, GERD, prediabetes, arthritis, anxiety, depression, anemia  Patient was sent to cardiology for preop clearance prior to surgery due to leg edema.  Seen on 01/06/2024.  Leg edema thought to be due to inactivity.  Echo was obtained which showed normal LV function.  She was advised to follow-up as needed.  VS: Ht 5\' 4"  (1.626 m)   Wt 74.8 kg   BMI 28.32 kg/m   PROVIDERS: Margaree Mackintosh, MD   LABS: Labs reviewed: Acceptable for surgery. Anemia slowly down trending from prior values. 10.8 on 01/20/24 down from 12.8 on 10/17/23. (all labs ordered are listed, but only abnormal results are displayed)  Labs Reviewed - No data to display   IMAGES:   EKG 01/06/2024 Normal sinus rhythm, rate 87  CV:  Echo 01/14/2024:   IMPRESSIONS    1. Valsalva performed (for hyperdynamic ventricle) with no evidence of inducible LVOT gradient. Left ventricular ejection fraction, by estimation, is 65%. Left ventricular ejection fraction by 3D volume is 64 %. The left ventricle has normal function. The left ventricle has no regional wall motion abnormalities. Left ventricular diastolic parameters are consistent with Grade I diastolic dysfunction (impaired relaxation). The average left ventricular global longitudinal strain is -21.7 %. The global longitudinal strain is normal.  2. Right ventricular systolic function is hyperdynamic. The right ventricular size is normal. There is normal pulmonary artery systolic pressure.  3. The mitral valve is normal in structure. No evidence of mitral valve regurgitation. No evidence of mitral  stenosis.  4. The aortic valve was not well visualized. There is mild calcification of the aortic valve. Aortic valve regurgitation is not visualized. Aortic valve sclerosis is present, with no evidence of aortic valve stenosis.  5. The inferior vena cava is normal in size with greater than 50% respiratory variability, suggesting right atrial pressure of 3 mmHg.  Past Medical History:  Diagnosis Date   Allergy    Anemia    Anxiety    Arthritis    Asthma    Depression    Diabetes mellitus without complication (HCC)    GERD (gastroesophageal reflux disease)    Hyperlipidemia     Past Surgical History:  Procedure Laterality Date   TRIGGER FINGER RELEASE      MEDICATIONS:  atorvastatin (LIPITOR) 40 MG tablet   Cholecalciferol (VITAMIN D-3 PO)   clobetasol cream (TEMOVATE) 0.05 %   fluticasone (FLONASE) 50 MCG/ACT nasal spray   fluticasone-salmeterol (ADVAIR) 250-50 MCG/ACT AEPB   furosemide (LASIX) 40 MG tablet   meloxicam (MOBIC) 15 MG tablet   metFORMIN (GLUCOPHAGE) 500 MG tablet   Multiple Vitamin (MULTIVITAMIN WITH MINERALS) TABS tablet   pantoprazole (PROTONIX) 40 MG tablet   TURMERIC PO   venlafaxine (EFFEXOR) 75 MG tablet   No current facility-administered medications for this encounter.   Donna Spears [facility] Surgical Short Stay/[facility] Phone [phone] 01/21/2024 2:05 PM

## 2024-01-23 ENCOUNTER — Ambulatory Visit: Payer: Medicare Other | Admitting: Physical Therapy

## 2024-01-24 NOTE — Patient Instructions (Addendum)
Exercises demonstrated for right shoulder impingement. May need night guard if grinding teeth at night. No evidence for SLE, gout, or Rheumatoid arthritis on lab

## 2024-01-27 ENCOUNTER — Other Ambulatory Visit: Payer: Self-pay

## 2024-01-27 ENCOUNTER — Ambulatory Visit (HOSPITAL_COMMUNITY): Payer: Medicare Other | Admitting: Medical

## 2024-01-27 ENCOUNTER — Encounter (HOSPITAL_COMMUNITY): Payer: Self-pay | Admitting: Orthopedic Surgery

## 2024-01-27 ENCOUNTER — Ambulatory Visit (HOSPITAL_COMMUNITY): Payer: Medicare Other | Admitting: Anesthesiology

## 2024-01-27 ENCOUNTER — Encounter (HOSPITAL_COMMUNITY)
Admission: RE | Disposition: A | Discharge status: Home / Self Care | Payer: Self-pay | Source: Ambulatory Visit | Attending: Orthopedic Surgery

## 2024-01-27 ENCOUNTER — Observation Stay (HOSPITAL_COMMUNITY)
Admission: RE | Admit: 2024-01-27 | Discharge: 2024-01-28 | Disposition: A | Discharge status: Home / Self Care | Payer: Medicare Other | Source: Ambulatory Visit | Attending: Orthopedic Surgery | Admitting: Orthopedic Surgery

## 2024-01-27 DIAGNOSIS — Z79899 Other long term (current) drug therapy: Secondary | ICD-10-CM | POA: Diagnosis not present

## 2024-01-27 DIAGNOSIS — M179 Osteoarthritis of knee, unspecified: Principal | ICD-10-CM | POA: Diagnosis present

## 2024-01-27 DIAGNOSIS — Z7984 Long term (current) use of oral hypoglycemic drugs: Secondary | ICD-10-CM | POA: Insufficient documentation

## 2024-01-27 DIAGNOSIS — J45909 Unspecified asthma, uncomplicated: Secondary | ICD-10-CM | POA: Diagnosis not present

## 2024-01-27 DIAGNOSIS — E785 Hyperlipidemia, unspecified: Secondary | ICD-10-CM | POA: Diagnosis not present

## 2024-01-27 DIAGNOSIS — M1711 Unilateral primary osteoarthritis, right knee: Secondary | ICD-10-CM | POA: Diagnosis not present

## 2024-01-27 DIAGNOSIS — E119 Type 2 diabetes mellitus without complications: Secondary | ICD-10-CM | POA: Insufficient documentation

## 2024-01-27 HISTORY — PX: TOTAL KNEE ARTHROPLASTY: SHX125

## 2024-01-27 LAB — GLUCOSE, CAPILLARY
Glucose-Capillary: 125 mg/dL — ABNORMAL HIGH (ref 70–99)
Glucose-Capillary: 134 mg/dL — ABNORMAL HIGH (ref 70–99)
Glucose-Capillary: 173 mg/dL — ABNORMAL HIGH (ref 70–99)
Glucose-Capillary: 174 mg/dL — ABNORMAL HIGH (ref 70–99)
Glucose-Capillary: 168 mg/dL — ABNORMAL HIGH (ref 70–99)

## 2024-01-27 SURGERY — ARTHROPLASTY, KNEE, TOTAL
Anesthesia: Spinal | Site: Knee | Laterality: Right

## 2024-01-27 MED ORDER — BUPIVACAINE-EPINEPHRINE (PF) 0.5% -1:200000 IJ SOLN
INTRAMUSCULAR | Status: DC | PRN
  Administered 2024-01-27: 20 mL via PERINEURAL

## 2024-01-27 MED ORDER — SODIUM CHLORIDE (PF) 0.9 % IJ SOLN
INTRAMUSCULAR | Status: AC
  Filled 2024-01-27: qty 50

## 2024-01-27 MED ORDER — POLYETHYLENE GLYCOL 3350 17 G PO PACK
17.0000 g | PACK | Freq: Every day | ORAL | Status: DC | PRN
Start: 2024-01-27 — End: 2024-01-28

## 2024-01-27 MED ORDER — FUROSEMIDE 40 MG PO TABS
40.0000 mg | ORAL_TABLET | Freq: Every day | ORAL | Status: DC
Start: 2024-01-28 — End: 2024-01-28
  Administered 2024-01-28: 40 mg via ORAL
  Filled 2024-01-27: qty 1

## 2024-01-27 MED ORDER — DOCUSATE SODIUM 100 MG PO CAPS
100.0000 mg | ORAL_CAPSULE | Freq: Two times a day (BID) | ORAL | Status: DC
  Administered 2024-01-27 – 2024-01-28 (×3): 100 mg via ORAL
  Filled 2024-01-27 (×3): qty 1

## 2024-01-27 MED ORDER — ONDANSETRON HCL 4 MG PO TABS
4.0000 mg | ORAL_TABLET | Freq: Four times a day (QID) | ORAL | Status: DC | PRN
Start: 2024-01-27 — End: 2024-01-28

## 2024-01-27 MED ORDER — DEXAMETHASONE SODIUM PHOSPHATE 10 MG/ML IJ SOLN
8.0000 mg | Freq: Once | INTRAMUSCULAR | Status: AC
  Administered 2024-01-27: 8 mg via INTRAVENOUS

## 2024-01-27 MED ORDER — ATORVASTATIN CALCIUM 40 MG PO TABS
40.0000 mg | ORAL_TABLET | Freq: Every day | ORAL | Status: DC
Start: 2024-01-28 — End: 2024-01-28

## 2024-01-27 MED ORDER — OXYCODONE HCL 5 MG PO TABS
10.0000 mg | ORAL_TABLET | ORAL | Status: DC | PRN
Start: 2024-01-27 — End: 2024-01-28
  Administered 2024-01-27: 10 mg via ORAL

## 2024-01-27 MED ORDER — MOMETASONE FURO-FORMOTEROL FUM 200-5 MCG/ACT IN AERO
2.0000 | INHALATION_SPRAY | Freq: Two times a day (BID) | RESPIRATORY_TRACT | Status: DC
  Administered 2024-01-28: 2 via RESPIRATORY_TRACT
  Filled 2024-01-27: qty 8.8

## 2024-01-27 MED ORDER — 0.9 % SODIUM CHLORIDE (POUR BTL) OPTIME
TOPICAL | Status: DC | PRN
  Administered 2024-01-27: 1000 mL

## 2024-01-27 MED ORDER — INSULIN ASPART 100 UNIT/ML IJ SOLN: 0.0000 [IU] | INTRAMUSCULAR | Status: DC | PRN

## 2024-01-27 MED ORDER — OXYCODONE HCL 5 MG PO TABS
5.0000 mg | ORAL_TABLET | ORAL | Status: DC | PRN
Start: 2024-01-27 — End: 2024-01-28
  Administered 2024-01-27 – 2024-01-28 (×3): 10 mg via ORAL
  Filled 2024-01-27 (×4): qty 2

## 2024-01-27 MED ORDER — INSULIN ASPART 100 UNIT/ML IJ SOLN: 0.0000 [IU] | Freq: Every day | INTRAMUSCULAR | Status: DC

## 2024-01-27 MED ORDER — MIDAZOLAM HCL 5 MG/5ML IJ SOLN
INTRAMUSCULAR | Status: DC | PRN
  Administered 2024-01-27: 2 mg via INTRAVENOUS

## 2024-01-27 MED ORDER — SODIUM CHLORIDE (PF) 0.9 % IJ SOLN
INTRAMUSCULAR | Status: AC
Start: 2024-01-27 — End: ?
  Filled 2024-01-27: qty 10

## 2024-01-27 MED ORDER — VENLAFAXINE HCL 75 MG PO TABS
75.0000 mg | ORAL_TABLET | Freq: Every day | ORAL | Status: DC
  Administered 2024-01-27: 75 mg via ORAL
  Filled 2024-01-27: qty 1

## 2024-01-27 MED ORDER — PHENYLEPHRINE HCL (PRESSORS) 10 MG/ML IV SOLN
INTRAVENOUS | Status: DC | PRN
  Administered 2024-01-27 (×2): 160 ug via INTRAVENOUS

## 2024-01-27 MED ORDER — PROPOFOL 1000 MG/100ML IV EMUL
INTRAVENOUS | Status: AC
  Filled 2024-01-27: qty 100

## 2024-01-27 MED ORDER — TRANEXAMIC ACID-NACL 1000-0.7 MG/100ML-% IV SOLN
1000.0000 mg | INTRAVENOUS | Status: AC
  Administered 2024-01-27: 1000 mg via INTRAVENOUS
  Filled 2024-01-27: qty 100

## 2024-01-27 MED ORDER — FLUTICASONE PROPIONATE 50 MCG/ACT NA SUSP
1.0000 | Freq: Every day | NASAL | Status: DC
  Administered 2024-01-28: 1 via NASAL
  Filled 2024-01-27: qty 16

## 2024-01-27 MED ORDER — PANTOPRAZOLE SODIUM 40 MG PO TBEC
40.0000 mg | DELAYED_RELEASE_TABLET | Freq: Every day | ORAL | Status: DC
Start: 2024-01-28 — End: 2024-01-28
  Administered 2024-01-27: 40 mg via ORAL
  Filled 2024-01-27: qty 1

## 2024-01-27 MED ORDER — GABAPENTIN 300 MG PO CAPS
300.0000 mg | ORAL_CAPSULE | Freq: Three times a day (TID) | ORAL | Status: DC
  Administered 2024-01-27 – 2024-01-28 (×4): 300 mg via ORAL
  Filled 2024-01-27 (×4): qty 1

## 2024-01-27 MED ORDER — ONDANSETRON HCL 4 MG/2ML IJ SOLN
4.0000 mg | Freq: Four times a day (QID) | INTRAMUSCULAR | Status: DC | PRN
Start: 2024-01-27 — End: 2024-01-28

## 2024-01-27 MED ORDER — POVIDONE-IODINE 10 % EX SWAB: 2.0000 | Freq: Once | CUTANEOUS | Status: DC

## 2024-01-27 MED ORDER — DIPHENHYDRAMINE HCL 12.5 MG/5ML PO ELIX
12.5000 mg | ORAL_SOLUTION | ORAL | Status: DC | PRN
Start: 2024-01-27 — End: 2024-01-28

## 2024-01-27 MED ORDER — BUPIVACAINE LIPOSOME 1.3 % IJ SUSP
INTRAMUSCULAR | Status: DC | PRN
  Administered 2024-01-27: 20 mL

## 2024-01-27 MED ORDER — SODIUM CHLORIDE 0.9 % IR SOLN
Status: DC | PRN
  Administered 2024-01-27: 1000 mL

## 2024-01-27 MED ORDER — SODIUM CHLORIDE 0.9 % IV SOLN: INTRAVENOUS | Status: DC

## 2024-01-27 MED ORDER — STERILE WATER FOR IRRIGATION IR SOLN
Status: DC | PRN
  Administered 2024-01-27: 1000 mL

## 2024-01-27 MED ORDER — ONDANSETRON HCL 4 MG/2ML IJ SOLN
INTRAMUSCULAR | Status: DC | PRN
  Administered 2024-01-27: 4 mg via INTRAVENOUS

## 2024-01-27 MED ORDER — MENTHOL 3 MG MT LOZG: 1.0000 | LOZENGE | OROMUCOSAL | Status: DC | PRN

## 2024-01-27 MED ORDER — VENLAFAXINE HCL 75 MG PO TABS: 75.0000 mg | ORAL_TABLET | Freq: Every day | ORAL | Status: DC

## 2024-01-27 MED ORDER — BUPIVACAINE IN DEXTROSE 0.75-8.25 % IT SOLN
INTRATHECAL | Status: DC | PRN
  Administered 2024-01-27: 1.6 mL via INTRATHECAL

## 2024-01-27 MED ORDER — FLEET ENEMA RE ENEM
1.0000 | ENEMA | Freq: Once | RECTAL | Status: DC | PRN
Start: 2024-01-27 — End: 2024-01-28

## 2024-01-27 MED ORDER — PHENYLEPHRINE 80 MCG/ML (10ML) SYRINGE FOR IV PUSH (FOR BLOOD PRESSURE SUPPORT)
PREFILLED_SYRINGE | INTRAVENOUS | Status: AC
  Filled 2024-01-27: qty 10

## 2024-01-27 MED ORDER — SODIUM CHLORIDE (PF) 0.9 % IJ SOLN
INTRAMUSCULAR | Status: DC | PRN
  Administered 2024-01-27: 60 mL

## 2024-01-27 MED ORDER — INSULIN ASPART 100 UNIT/ML IJ SOLN
0.0000 [IU] | Freq: Three times a day (TID) | INTRAMUSCULAR | Status: DC
  Administered 2024-01-27 (×2): 3 [IU] via SUBCUTANEOUS

## 2024-01-27 MED ORDER — METHOCARBAMOL 500 MG PO TABS
500.0000 mg | ORAL_TABLET | Freq: Four times a day (QID) | ORAL | Status: DC | PRN
  Administered 2024-01-27 – 2024-01-28 (×2): 500 mg via ORAL
  Filled 2024-01-27 (×2): qty 1

## 2024-01-27 MED ORDER — ASPIRIN 81 MG PO CHEW
81.0000 mg | CHEWABLE_TABLET | Freq: Two times a day (BID) | ORAL | Status: DC
  Administered 2024-01-28: 81 mg via ORAL
  Filled 2024-01-27: qty 1

## 2024-01-27 MED ORDER — HYDROMORPHONE HCL 1 MG/ML IJ SOLN
0.5000 mg | INTRAMUSCULAR | Status: DC | PRN
  Administered 2024-01-27: 0.5 mg via INTRAVENOUS
  Filled 2024-01-27: qty 1

## 2024-01-27 MED ORDER — METHOCARBAMOL 1000 MG/10ML IJ SOLN: 500.0000 mg | Freq: Four times a day (QID) | INTRAMUSCULAR | Status: DC | PRN

## 2024-01-27 MED ORDER — LACTATED RINGERS IV SOLN: INTRAVENOUS | Status: DC

## 2024-01-27 MED ORDER — PHENYLEPHRINE HCL-NACL 20-0.9 MG/250ML-% IV SOLN
INTRAVENOUS | Status: AC
  Filled 2024-01-27: qty 250

## 2024-01-27 MED ORDER — LACTATED RINGERS IV SOLN: INTRAVENOUS | Status: DC | PRN

## 2024-01-27 MED ORDER — BUPIVACAINE LIPOSOME 1.3 % IJ SUSP: 20.0000 mL | Freq: Once | INTRAMUSCULAR | Status: DC

## 2024-01-27 MED ORDER — ACETAMINOPHEN 10 MG/ML IV SOLN
1000.0000 mg | Freq: Four times a day (QID) | INTRAVENOUS | Status: DC
  Administered 2024-01-27: 1000 mg via INTRAVENOUS
  Filled 2024-01-27: qty 100

## 2024-01-27 MED ORDER — BISACODYL 10 MG RE SUPP: 10.0000 mg | Freq: Every day | RECTAL | Status: DC | PRN

## 2024-01-27 MED ORDER — FENTANYL CITRATE (PF) 100 MCG/2ML IJ SOLN
INTRAMUSCULAR | Status: AC
  Filled 2024-01-27: qty 2

## 2024-01-27 MED ORDER — CEFAZOLIN SODIUM-DEXTROSE 2-4 GM/100ML-% IV SOLN
2.0000 g | Freq: Four times a day (QID) | INTRAVENOUS | Status: AC
  Administered 2024-01-27 (×2): 2 g via INTRAVENOUS
  Filled 2024-01-27 (×2): qty 100

## 2024-01-27 MED ORDER — ONDANSETRON HCL 4 MG/2ML IJ SOLN
INTRAMUSCULAR | Status: AC
  Filled 2024-01-27: qty 2

## 2024-01-27 MED ORDER — ACETAMINOPHEN 500 MG PO TABS
1000.0000 mg | ORAL_TABLET | Freq: Four times a day (QID) | ORAL | Status: AC
  Administered 2024-01-27 – 2024-01-28 (×3): 1000 mg via ORAL
  Filled 2024-01-27 (×3): qty 2

## 2024-01-27 MED ORDER — CEFAZOLIN SODIUM-DEXTROSE 2-4 GM/100ML-% IV SOLN
2.0000 g | INTRAVENOUS | Status: AC
  Administered 2024-01-27: 2 g via INTRAVENOUS
  Filled 2024-01-27: qty 100

## 2024-01-27 MED ORDER — ORAL CARE MOUTH RINSE: 15.0000 mL | Freq: Once | OROMUCOSAL | Status: AC

## 2024-01-27 MED ORDER — PHENOL 1.4 % MT LIQD: 1.0000 | OROMUCOSAL | Status: DC | PRN

## 2024-01-27 MED ORDER — PROPOFOL 500 MG/50ML IV EMUL
INTRAVENOUS | Status: DC | PRN
  Administered 2024-01-27: 80 ug/kg/min via INTRAVENOUS

## 2024-01-27 MED ORDER — BUPIVACAINE LIPOSOME 1.3 % IJ SUSP
INTRAMUSCULAR | Status: AC
  Filled 2024-01-27: qty 20

## 2024-01-27 MED ORDER — METOCLOPRAMIDE HCL 5 MG PO TABS: 5.0000 mg | ORAL_TABLET | Freq: Three times a day (TID) | ORAL | Status: DC | PRN

## 2024-01-27 MED ORDER — HYDROMORPHONE HCL 1 MG/ML IJ SOLN: 0.2500 mg | INTRAMUSCULAR | Status: DC | PRN

## 2024-01-27 MED ORDER — MIDAZOLAM HCL 2 MG/2ML IJ SOLN
INTRAMUSCULAR | Status: AC
  Filled 2024-01-27: qty 2

## 2024-01-27 MED ORDER — ACETAMINOPHEN 325 MG PO TABS
325.0000 mg | ORAL_TABLET | Freq: Four times a day (QID) | ORAL | Status: DC | PRN
Start: 2024-01-28 — End: 2024-01-28

## 2024-01-27 MED ORDER — DEXAMETHASONE SODIUM PHOSPHATE 10 MG/ML IJ SOLN
INTRAMUSCULAR | Status: AC
  Filled 2024-01-27: qty 1

## 2024-01-27 MED ORDER — CHLORHEXIDINE GLUCONATE 0.12 % MT SOLN
15.0000 mL | Freq: Once | OROMUCOSAL | Status: AC
  Administered 2024-01-27: 15 mL via OROMUCOSAL

## 2024-01-27 MED ORDER — FENTANYL CITRATE (PF) 100 MCG/2ML IJ SOLN
INTRAMUSCULAR | Status: DC | PRN
  Administered 2024-01-27 (×2): 50 ug via INTRAVENOUS

## 2024-01-27 MED ORDER — DEXAMETHASONE SODIUM PHOSPHATE 10 MG/ML IJ SOLN
10.0000 mg | Freq: Once | INTRAMUSCULAR | Status: AC
  Administered 2024-01-28: 10 mg via INTRAVENOUS
  Filled 2024-01-27: qty 1

## 2024-01-27 MED ORDER — PROPOFOL 10 MG/ML IV BOLUS
INTRAVENOUS | Status: DC | PRN
  Administered 2024-01-27: 20 mg via INTRAVENOUS
  Administered 2024-01-27: 60 mg via INTRAVENOUS

## 2024-01-27 MED ORDER — METOCLOPRAMIDE HCL 5 MG/ML IJ SOLN
5.0000 mg | Freq: Three times a day (TID) | INTRAMUSCULAR | Status: DC | PRN
Start: 2024-01-27 — End: 2024-01-28

## 2024-01-27 SURGICAL SUPPLY — 44 items
ATTUNE MED DOME PAT 38 KNEE (Knees) IMPLANT
ATTUNE PS FEM RT SZ 5 CEM KNEE (Femur) IMPLANT
ATTUNE PSRP INSE SZ5 7 KNEE (Insert) IMPLANT
BAG COUNTER SPONGE SURGICOUNT (BAG) IMPLANT
BAG ZIPLOCK 12X15 (MISCELLANEOUS) ×1 IMPLANT
BASEPLATE TIBIAL ROTATING SZ 4 (Knees) IMPLANT
BLADE SAG 18X100X1.27 (BLADE) ×1 IMPLANT
BLADE SAW SGTL 11.0X1.19X90.0M (BLADE) ×1 IMPLANT
BNDG ELASTIC 6INX 5YD STR LF (GAUZE/BANDAGES/DRESSINGS) ×1 IMPLANT
BOWL SMART MIX CTS (DISPOSABLE) ×1 IMPLANT
CEMENT HV SMART SET (Cement) ×2 IMPLANT
COVER SURGICAL LIGHT HANDLE (MISCELLANEOUS) ×1 IMPLANT
CUFF TRNQT CYL 34X4.125X (TOURNIQUET CUFF) ×1 IMPLANT
DERMABOND ADVANCED .7 DNX12 (GAUZE/BANDAGES/DRESSINGS) ×1 IMPLANT
DRAPE U-SHAPE 47X51 STRL (DRAPES) ×1 IMPLANT
DRSG AQUACEL AG ADV 3.5X10 (GAUZE/BANDAGES/DRESSINGS) ×1 IMPLANT
DURAPREP 26ML APPLICATOR (WOUND CARE) ×1 IMPLANT
ELECT REM PT RETURN 15FT ADLT (MISCELLANEOUS) ×1 IMPLANT
GLOVE BIO SURGEON STRL SZ 6.5 (GLOVE) IMPLANT
GLOVE BIO SURGEON STRL SZ7 (GLOVE) IMPLANT
GLOVE BIO SURGEON STRL SZ8 (GLOVE) ×1 IMPLANT
GLOVE BIOGEL PI IND STRL 7.0 (GLOVE) IMPLANT
GLOVE BIOGEL PI IND STRL 8 (GLOVE) ×1 IMPLANT
GOWN STRL REUS W/ TWL LRG LVL3 (GOWN DISPOSABLE) ×1 IMPLANT
HOLDER FOLEY CATH W/STRAP (MISCELLANEOUS) IMPLANT
IMMOBILIZER KNEE 20 (SOFTGOODS) ×1 IMPLANT
IMMOBILIZER KNEE 20 THIGH 36 (SOFTGOODS) ×1 IMPLANT
KIT TURNOVER KIT A (KITS) IMPLANT
MANIFOLD NEPTUNE II (INSTRUMENTS) ×1 IMPLANT
NS IRRIG 1000ML POUR BTL (IV SOLUTION) ×1 IMPLANT
PACK TOTAL KNEE CUSTOM (KITS) ×1 IMPLANT
PADDING CAST COTTON 6X4 STRL (CAST SUPPLIES) ×2 IMPLANT
PIN STEINMAN FIXATION KNEE (PIN) IMPLANT
PROTECTOR NERVE ULNAR (MISCELLANEOUS) ×1 IMPLANT
SET HNDPC FAN SPRY TIP SCT (DISPOSABLE) ×1 IMPLANT
SUT MNCRL AB 4-0 PS2 18 (SUTURE) ×1 IMPLANT
SUT STRATAFIX 0 PDS 27 VIOLET (SUTURE) ×1 IMPLANT
SUT VIC AB 2-0 CT1 TAPERPNT 27 (SUTURE) ×3 IMPLANT
SUTURE STRATFX 0 PDS 27 VIOLET (SUTURE) ×1 IMPLANT
TRAY FOLEY MTR SLVR 16FR STAT (SET/KITS/TRAYS/PACK) IMPLANT
TRAY FOLEY SLVR 14FR TEMP STAT (SET/KITS/TRAYS/PACK) IMPLANT
TUBE SUCTION HIGH CAP CLEAR NV (SUCTIONS) ×1 IMPLANT
WATER STERILE IRR 1000ML POUR (IV SOLUTION) ×2 IMPLANT
WRAP KNEE MAXI GEL POST OP (GAUZE/BANDAGES/DRESSINGS) ×1 IMPLANT

## 2024-01-27 NOTE — Anesthesia Postprocedure Evaluation (Signed)
Anesthesia Post Note  Patient: Donna Spears  Procedure(s) Performed: TOTAL KNEE ARTHROPLASTY (Right: Knee)     Patient location during evaluation: PACU Anesthesia Type: Spinal and Regional Level of consciousness: oriented and awake and alert Pain management: pain level controlled Vital Signs Assessment: post-procedure vital signs reviewed and stable Respiratory status: spontaneous breathing, respiratory function stable and patient connected to nasal cannula oxygen Cardiovascular status: blood pressure returned to baseline and stable Postop Assessment: no headache, no backache, no apparent nausea or vomiting, spinal receding and patient able to bend at knees Anesthetic complications: no  No notable events documented.  Last Vitals:  Vitals:   01/27/24 0945 01/27/24 0946  BP: 115/63 115/63  Pulse: 88 87  Resp: 16 (!) 22  Temp:    SpO2: 96% 97%    Last Pain:  Vitals:   01/27/24 0946  TempSrc:   PainSc: 0-No pain                 Dorris Pierre,W. EDMOND

## 2024-01-27 NOTE — Transfer of Care (Signed)
Immediate Anesthesia Transfer of Care Note  Patient: Donna Spears  Procedure(s) Performed: TOTAL KNEE ARTHROPLASTY (Right: Knee)  Patient Location: PACU  Anesthesia Type:MAC and Spinal  Level of Consciousness: awake and alert   Airway & Oxygen Therapy: Patient Spontanous Breathing and Patient connected to face mask oxygen  Post-op Assessment: Report given to RN and Post -op Vital signs reviewed and stable  Post vital signs: Reviewed and stable  Last Vitals:  Vitals Value Taken Time  BP 117/62 01/27/24 0839  Temp    Pulse 85 01/27/24 0842  Resp 16 01/27/24 0842  SpO2 95 % 01/27/24 0842  Vitals shown include unfiled device data.  Last Pain:  Vitals:   01/27/24 0603  TempSrc:   PainSc: 0-No pain         Complications: No notable events documented.

## 2024-01-27 NOTE — Interval H&P Note (Signed)
History and Physical Interval Note:  01/27/2024 6:30 AM  Donna Spears  has presented today for surgery, with the diagnosis of right knee osteoarthritis.  The various methods of treatment have been discussed with the patient and family. After consideration of risks, benefits and other options for treatment, the patient has consented to  Procedure(s): TOTAL KNEE ARTHROPLASTY (Right) as a surgical intervention.  The patient's history has been reviewed, patient examined, no change in status, stable for surgery.  I have reviewed the patient's chart and labs.  Questions were answered to the patient's satisfaction.     Homero Fellers Chaka Jefferys

## 2024-01-27 NOTE — Evaluation (Signed)
Physical Therapy Evaluation Patient Details Name: Donna Spears MRN: [id number] DOB: January 13, 1956 Today's Date: 01/27/2024  History of Present Illness  68 yo female s/p  R TKA on 01/27/24. PMH: depression, GERD, HLD  Clinical Impression  Pt is s/p TKA resulting in the deficits listed below (see PT Problem List).  Pt amb 30' with RW and min assist.  C/o some dizziness with amb, resolved end of session. Anticipate steady progress in acute setting  Pt will benefit from acute skilled PT to increase their independence and safety with mobility to allow discharge.          If plan is discharge home, recommend the following: A little help with walking and/or transfers;A little help with bathing/dressing/bathroom;Assistance with cooking/housework;Help with stairs or ramp for entrance   Can travel by private vehicle        Equipment Recommendations Rolling walker (2 wheels)  Recommendations for Other Services       Functional Status Assessment Patient has had a recent decline in their functional status and demonstrates the ability to make significant improvements in function in a reasonable and predictable amount of time.     Precautions / Restrictions Precautions Precautions: Fall;Knee Required Braces or Orthoses: Knee Immobilizer - Right Knee Immobilizer - Right: Discontinue once straight leg raise with < 10 degree lag Restrictions Weight Bearing Restrictions Per Provider Order: No Other Position/Activity Restrictions: WBAT      Mobility  Bed Mobility Overal bed mobility: Needs Assistance Bed Mobility: Supine to Sit     Supine to sit: Min assist     General bed mobility comments: assist with RLE, incr time, cues to self assist    Transfers Overall transfer level: Needs assistance Equipment used: Rolling walker (2 wheels) Transfers: Sit to/from Stand Sit to Stand: Min assist, Mod assist           General transfer comment: assist to rise and stabilize, cues for proper hand  placement and RLE position    Ambulation/Gait Ambulation/Gait assistance: Contact guard assist, Min assist Gait Distance (Feet): 30 Feet Assistive device: Rolling walker (2 wheels) Gait Pattern/deviations: Step-to pattern, Decreased stance time - right       General Gait Details: cues for sequence, RW position  Stairs            Wheelchair Mobility     Tilt Bed    Modified Rankin (Stroke Patients Only)       Balance Overall balance assessment: Needs assistance Sitting-balance support: Feet supported, Single extremity supported Sitting balance-Leahy Scale: Fair   Postural control: Posterior lean Standing balance support: During functional activity, Reliant on assistive device for balance Standing balance-Leahy Scale: Poor                               Pertinent Vitals/Pain Pain Assessment Pain Assessment: 0-10 Pain Score: 6  Pain Location: right knee Pain Descriptors / Indicators: Aching, Sore Pain Intervention(s): Limited activity within patient's tolerance, Monitored during session, Premedicated before session, RN gave pain meds during session, Ice applied    Home Living Family/patient expects to be discharged to:: Private residence Living Arrangements: Spouse/significant other;Children;Other relatives Available Help at Discharge: Family Type of Home: House Home Access: Ramped entrance     Alternate Level Stairs-Number of Steps: has chair lift Home Layout: Two level Home Equipment: None      Prior Function Prior Level of Function : Independent/Modified Independent  Extremity/Trunk Assessment   Upper Extremity Assessment Upper Extremity Assessment: Overall WFL for tasks assessed    Lower Extremity Assessment Lower Extremity Assessment: RLE deficits/detail RLE Deficits / Details: ankle WFL, knee extension and hip flexion 2+/5       Communication   Communication Communication: No apparent difficulties   Cognition Arousal: Alert Behavior During Therapy: WFL for tasks assessed/performed Overall Cognitive Status: Within Functional Limits for tasks assessed                                          General Comments      Exercises Total Joint Exercises Ankle Circles/Pumps: AROM, Both, 5 reps Quad Sets: 5 reps, Both, AROM   Assessment/Plan    PT Assessment Patient needs continued PT services  PT Problem List Decreased strength;Decreased activity tolerance;Decreased knowledge of use of DME;Decreased mobility;Pain       PT Treatment Interventions DME instruction;Therapeutic exercise;Gait training;Functional mobility training;Therapeutic activities;Patient/family education;Stair training    PT Goals (Current goals can be found in the Care Plan section)  Acute Rehab PT Goals PT Goal Formulation: With patient Time For Goal Achievement: 02/03/24 Potential to Achieve Goals: Good    Frequency 7X/week     Co-evaluation               AM-PAC PT "6 Clicks" Mobility  Outcome Measure Help needed turning from your back to your side while in a flat bed without using bedrails?: A Little Help needed moving from lying on your back to sitting on the side of a flat bed without using bedrails?: A Little Help needed moving to and from a bed to a chair (including a wheelchair)?: A Little Help needed standing up from a chair using your arms (e.g., wheelchair or bedside chair)?: A Little Help needed to walk in hospital room?: A Little Help needed climbing 3-5 steps with a railing? : A Little 6 Click Score: 18    End of Session Equipment Utilized During Treatment: Gait belt;Right knee immobilizer Activity Tolerance: Patient tolerated treatment well Patient left: in chair;with call bell/phone within reach;with chair alarm set;with family/visitor present Nurse Communication: Mobility status PT Visit Diagnosis: Other abnormalities of gait and mobility (R26.89)    Time:  1610-9604 PT Time Calculation (min) (ACUTE ONLY): 33 min   Charges:   PT Evaluation $PT Eval Low Complexity: 1 Low PT Treatments $Gait Training: 8-22 mins PT General Charges $$ ACUTE PT VISIT: 1 Visit         Richelle Glick, PT  Acute Rehab Dept ([facility]) [phone]  01/27/2024   [facility] 01/27/2024, 2:54 PM

## 2024-01-27 NOTE — Progress Notes (Signed)
Orthopedic Tech Progress Note Patient Details:  Donna Spears [birth year] [id number]  CPM Right Knee CPM Right Knee: On Right Knee Flexion (Degrees): 40 Right Knee Extension (Degrees): 10  Post Interventions Patient Tolerated: Poor  [name] 01/27/2024, 10:35 AM

## 2024-01-27 NOTE — Discharge Instructions (Signed)
Gaynelle Arabian, MD Total Joint Specialist [facility] [place] [address], [address] [place], [place] [address] [phone]  TOTAL KNEE REPLACEMENT POSTOPERATIVE DIRECTIONS    Knee Rehabilitation, Guidelines Following Surgery  Results after knee surgery are often greatly improved when you follow the exercise, range of motion and muscle strengthening exercises prescribed by your doctor. Safety measures are also important to protect the knee from further injury. If any of these exercises cause you to have increased pain or swelling in your knee joint, decrease the amount until you are comfortable again and slowly increase them. If you have problems or questions, call your caregiver or physical therapist for advice.   BLOOD CLOT PREVENTION Take an 81 mg Aspirin two times a day for three weeks following surgery. Then take an 81 mg Aspirin once a day for three weeks. Then discontinue Aspirin. You may resume your vitamins/supplements upon discharge from the hospital. Do not take any NSAIDs (Advil, Aleve, Ibuprofen, Meloxicam, etc.) until you are 3 weeks out from surgery.  HOME CARE INSTRUCTIONS  Remove items at home which could result in a fall. This includes throw rugs or furniture in walking pathways.  ICE to the affected knee as much as tolerated. Icing helps control swelling. If the swelling is well controlled you will be more comfortable and rehab easier. Continue to use ice on the knee for pain and swelling from surgery. You may notice swelling that will progress down to the foot and ankle. This is normal after surgery. Elevate the leg when you are not up walking on it.    Continue to use the breathing machine which will help keep your temperature down. It is common for your temperature to cycle up and down following surgery, especially at night when you are not up moving around and exerting yourself. The breathing machine keeps your lungs expanded and your temperature down. Do  not place pillow under the operative knee, focus on keeping the knee straight while resting  DIET You may resume your previous home diet once you are discharged from the hospital.  DRESSING / WOUND CARE / SHOWERING Keep your bulky bandage on for 2 days. On the third post-operative day you may remove the Ace bandage and gauze. There is a waterproof adhesive bandage on your skin which will stay in place until your first follow-up appointment. Once you remove this you will not need to place another bandage You may begin showering 3 days following surgery, but do not submerge the incision under water.  ACTIVITY For the first 5 days, the key is rest and control of pain and swelling Do your home exercises twice a day starting on post-operative day 3. On the days you go to physical therapy, just do the home exercises once that day. You should rest, ice and elevate the leg for 50 minutes out of every hour. Get up and walk/stretch for 10 minutes per hour. After 5 days you can increase your activity slowly as tolerated. Walk with your walker as instructed. Use the walker until you are comfortable transitioning to a cane. Walk with the cane in the opposite hand of the operative leg. You may discontinue the cane once you are comfortable and walking steadily. Avoid periods of inactivity such as sitting longer than an hour when not asleep. This helps prevent blood clots.  You may discontinue the knee immobilizer once you are able to perform a straight leg raise while lying down. You may resume a sexual relationship in one month  or when given the OK by your doctor.  You may return to work once you are cleared by your doctor.  Do not drive a car for 6 weeks or until released by your surgeon.  Do not drive while taking narcotics.  TED HOSE STOCKINGS Wear the elastic stockings on both legs for three weeks following surgery during the day. You may remove them at night for sleeping.  WEIGHT BEARING Weight  bearing as tolerated with assist device (walker, cane, etc) as directed, use it as long as suggested by your surgeon or therapist, typically at least 4-6 weeks.  POSTOPERATIVE CONSTIPATION PROTOCOL Constipation - defined medically as fewer than three stools per week and severe constipation as less than one stool per week.  One of the most common issues patients have following surgery is constipation.  Even if you have a regular bowel pattern at home, your normal regimen is likely to be disrupted due to multiple reasons following surgery.  Combination of anesthesia, postoperative narcotics, change in appetite and fluid intake all can affect your bowels.  In order to avoid complications following surgery, here are some recommendations in order to help you during your recovery period.  Colace (docusate) - Pick up an over-the-counter form of Colace or another stool softener and take twice a day as long as you are requiring postoperative pain medications.  Take with a full glass of water daily.  If you experience loose stools or diarrhea, hold the colace until you stool forms back up. If your symptoms do not get better within 1 week or if they get worse, check with your doctor. Dulcolax (bisacodyl) - Pick up over-the-counter and take as directed by the product packaging as needed to assist with the movement of your bowels.  Take with a full glass of water.  Use this product as needed if not relieved by Colace only.  MiraLax (polyethylene glycol) - Pick up over-the-counter to have on hand. MiraLax is a solution that will increase the amount of water in your bowels to assist with bowel movements.  Take as directed and can mix with a glass of water, juice, soda, coffee, or tea. Take if you go more than two days without a movement. Do not use MiraLax more than once per day. Call your doctor if you are still constipated or irregular after using this medication for 7 days in a row.  If you continue to have problems  with postoperative constipation, please contact the office for further assistance and recommendations.  If you experience "the worst abdominal pain ever" or develop nausea or vomiting, please contact the office immediatly for further recommendations for treatment.  ITCHING If you experience itching with your medications, try taking only a single pain pill, or even half a pain pill at a time.  You can also use Benadryl over the counter for itching or also to help with sleep.   MEDICATIONS See your medication summary on the "After Visit Summary" that the nursing staff will review with you prior to discharge.  You may have some home medications which will be placed on hold until you complete the course of blood thinner medication.  It is important for you to complete the blood thinner medication as prescribed by your surgeon.  Continue your approved medications as instructed at time of discharge.  PRECAUTIONS If you experience chest pain or shortness of breath - call 911 immediately for transfer to the hospital emergency department.  If you develop a fever greater that 101 F,  purulent drainage from wound, increased redness or drainage from wound, foul odor from the wound/dressing, or calf pain - CONTACT YOUR SURGEON.                                                   FOLLOW-UP APPOINTMENTS Make sure you keep all of your appointments after your operation with your surgeon and caregivers. You should call the office at the above phone number and make an appointment for approximately two weeks after the date of your surgery or on the date instructed by your surgeon outlined in the "After Visit Summary".  RANGE OF MOTION AND STRENGTHENING EXERCISES  Rehabilitation of the knee is important following a knee injury or an operation. After just a few days of immobilization, the muscles of the thigh which control the knee become weakened and shrink (atrophy). Knee exercises are designed to build up the tone and  strength of the thigh muscles and to improve knee motion. Often times heat used for twenty to thirty minutes before working out will loosen up your tissues and help with improving the range of motion but do not use heat for the first two weeks following surgery. These exercises can be done on a training (exercise) mat, on the floor, on a table or on a bed. Use what ever works the best and is most comfortable for you Knee exercises include:  Leg Lifts - While your knee is still immobilized in a splint or cast, you can do straight leg raises. Lift the leg to 60 degrees, hold for 3 sec, and slowly lower the leg. Repeat 10-20 times 2-3 times daily. Perform this exercise against resistance later as your knee gets better.  Quad and Hamstring Sets - Tighten up the muscle on the front of the thigh (Quad) and hold for 5-10 sec. Repeat this 10-20 times hourly. Hamstring sets are done by pushing the foot backward against an object and holding for 5-10 sec. Repeat as with quad sets.  Leg Slides: Lying on your back, slowly slide your foot toward your buttocks, bending your knee up off the floor (only go as far as is comfortable). Then slowly slide your foot back down until your leg is flat on the floor again. Angel Wings: Lying on your back spread your legs to the side as far apart as you can without causing discomfort.  A rehabilitation program following serious knee injuries can speed recovery and prevent re-injury in the future due to weakened muscles. Contact your doctor or a physical therapist for more information on knee rehabilitation.   POST-OPERATIVE OPIOID TAPER INSTRUCTIONS: It is important to wean off of your opioid medication as soon as possible. If you do not need pain medication after your surgery it is ok to stop day one. Opioids include: Codeine, Hydrocodone(Norco, Vicodin), Oxycodone(Percocet, oxycontin) and hydromorphone amongst others.  Long term and even short term use of opiods can  cause: Increased pain response Dependence Constipation Depression Respiratory depression And more.  Withdrawal symptoms can include Flu like symptoms Nausea, vomiting And more Techniques to manage these symptoms Hydrate well Eat regular healthy meals Stay active Use relaxation techniques(deep breathing, meditating, yoga) Do Not substitute Alcohol to help with tapering If you have been on opioids for less than two weeks and do not have pain than it is ok to stop all together.  Plan  to wean off of opioids This plan should start within one week post op of your joint replacement. Maintain the same interval or time between taking each dose and first decrease the dose.  Cut the total daily intake of opioids by one tablet each day Next start to increase the time between doses. The last dose that should be eliminated is the evening dose.   IF YOU ARE TRANSFERRED TO A SKILLED REHAB FACILITY If the patient is transferred to a skilled rehab facility following release from the hospital, a list of the current medications will be sent to the facility for the patient to continue.  When discharged from the skilled rehab facility, please have the facility set up the patient's Home Health Physical Therapy prior to being released. Also, the skilled facility will be responsible for providing the patient with their medications at time of release from the facility to include their pain medication, the muscle relaxants, and their blood thinner medication. If the patient is still at the rehab facility at time of the two week follow up appointment, the skilled rehab facility will also need to assist the patient in arranging follow up appointment in our office and any transportation needs.  MAKE SURE YOU:  Understand these instructions.  Get help right away if you are not doing well or get worse.   DENTAL ANTIBIOTICS:  In most cases prophylactic antibiotics for Dental procdeures after total joint surgery are  not necessary.  Exceptions are as follows:  1. History of prior total joint infection  2. Severely immunocompromised (Organ Transplant, cancer chemotherapy, Rheumatoid biologic meds such as Humera)  3. Poorly controlled diabetes (A1C &gt; 8.0, blood glucose over 200)  If you have one of these conditions, contact your surgeon for an antibiotic prescription, prior to your dental procedure.    Pick up stool softner and laxative for home use following surgery while on pain medications. Do not submerge incision under water. Please use good hand washing techniques while changing dressing each day. May shower starting three days after surgery. Please use a clean towel to pat the incision dry following showers. Continue to use ice for pain and swelling after surgery. Do not use any lotions or creams on the incision until instructed by your surgeon.  

## 2024-01-27 NOTE — Op Note (Signed)
OPERATIVE REPORT-TOTAL KNEE ARTHROPLASTY   Pre-operative diagnosis- Osteoarthritis  Right knee(s)  Post-operative diagnosis- Osteoarthritis Right knee(s)  Procedure-  Right  Total Knee Arthroplasty  Surgeon- Gus Rankin. Dorette Hartel, MD  Assistant- Weston Brass, PA-C   Anesthesia-   Adductor canal block and Spinal  EBL- 25 ml   Drains None  Tourniquet time-  Total Tourniquet Time Documented: Thigh (Right) - 32 minutes Total: Thigh (Right) - 32 minutes     Complications- None  Condition-PACU - hemodynamically stable.   Brief Clinical Note  Donna Spears is a 68 y.o. year old female with end stage OA of her right knee with progressively worsening pain and dysfunction. She has constant pain, with activity and at rest and significant functional deficits with difficulties even with ADLs. She has had extensive non-op management including analgesics, injections of cortisone and viscosupplements, and home exercise program, but remains in significant pain with significant dysfunction.Radiographs show bone on bone arthritis medial and patellofemoral. She presents now for right Total Knee Arthroplasty.     Procedure in detail---   The patient is brought into the operating room and positioned supine on the operating table. After successful administration of   adductor canal block and spinal ,   a tourniquet is placed high on the  Right thigh(s) and the lower extremity is prepped and draped in the usual sterile fashion. Time out is performed by the operating team and then the  Right lower extremity is wrapped in Esmarch, knee flexed and the tourniquet inflated to 300 mmHg.       A midline incision is made with a ten blade through the subcutaneous tissue to the level of the extensor mechanism. A fresh blade is used to make a medial parapatellar arthrotomy. Soft tissue over the proximal medial tibia is subperiosteally elevated to the joint line with a knife and into the semimembranosus bursa with a  Cobb elevator. Soft tissue over the proximal lateral tibia is elevated with attention being paid to avoiding the patellar tendon on the tibial tubercle. The patella is everted, knee flexed 90 degrees and the ACL and PCL are removed. Findings are bone on bone medial and patellofemoral with large global osteophytes.        The drill is used to create a starting hole in the distal femur and the canal is thoroughly irrigated with sterile saline to remove the fatty contents. The 5 degree Right  valgus alignment guide is placed into the femoral canal and the distal femoral cutting block is pinned to remove 9 mm off the distal femur. Resection is made with an oscillating saw.      The tibia is subluxed forward and the menisci are removed. The extramedullary alignment guide is placed referencing proximally at the medial aspect of the tibial tubercle and distally along the second metatarsal axis and tibial crest. The block is pinned to remove 2mm off the more deficient medial  side. Resection is made with an oscillating saw. Size 4is the most appropriate size for the tibia and the proximal tibia is prepared with the modular drill and keel punch for that size.      The femoral sizing guide is placed and size 5 is most appropriate. Rotation is marked off the epicondylar axis and confirmed by creating a rectangular flexion gap at 90 degrees. The size 5 cutting block is pinned in this rotation and the anterior, posterior and chamfer cuts are made with the oscillating saw. The intercondylar block is then placed and that cut  is made.      Trial size 4 tibial component, trial size 5 posterior stabilized femur and a 7  mm posterior stabilized rotating platform insert trial is placed. Full extension is achieved with excellent varus/valgus and anterior/posterior balance throughout full range of motion. The patella is everted and thickness measured to be 22  mm. Free hand resection is taken to 12 mm, a 38 template is placed, lug  holes are drilled, trial patella is placed, and it tracks normally. Osteophytes are removed off the posterior femur with the trial in place. All trials are removed and the cut bone surfaces prepared with pulsatile lavage. Cement is mixed and once ready for implantation, the size 4 tibial implant, size  5 posterior stabilized femoral component, and the size 38 patella are cemented in place and the patella is held with the clamp. The trial insert is placed and the knee held in full extension. The Exparel (20 ml mixed with 60 ml saline) is injected into the extensor mechanism, posterior capsule, medial and lateral gutters and subcutaneous tissues.  All extruded cement is removed and once the cement is hard the permanent 7 mm posterior stabilized rotating platform insert is placed into the tibial tray.      The wound is copiously irrigated with saline solution and the extensor mechanism closed with # 0 Stratofix suture. The tourniquet is released for a total tourniquet time of 32  minutes. Flexion against gravity is 140 degrees and the patella tracks normally. Subcutaneous tissue is closed with 2.0 vicryl and subcuticular with running 4.0 Monocryl. The incision is cleaned and dried and steri-strips and a bulky sterile dressing are applied. The limb is placed into a knee immobilizer and the patient is awakened and transported to recovery in stable condition.      Please note that a surgical assistant was a medical necessity for this procedure in order to perform it in a safe and expeditious manner. Surgical assistant was necessary to retract the ligaments and vital neurovascular structures to prevent injury to them and also necessary for proper positioning of the limb to allow for anatomic placement of the prosthesis.   Gus Rankin Dontai Pember, MD    01/27/2024, 8:17 AM

## 2024-01-27 NOTE — Anesthesia Procedure Notes (Signed)
Anesthesia Regional Block: Adductor canal block   Pre-Anesthetic Checklist: , timeout performed,  Correct Patient, Correct Site, Correct Laterality,  Correct Procedure, Correct Position, site marked,  Risks and benefits discussed,  Pre-op evaluation,  At surgeon's request and post-op pain management  Laterality: Right  Prep: Maximum Sterile Barrier Precautions used, chloraprep       Needles:  Injection technique: Single-shot  Needle Type: Echogenic Stimulator Needle     Needle Length: 9cm  Needle Gauge: 21     Additional Needles:   Procedures:,,,, ultrasound used (permanent image in chart),,    Narrative:  Start time: 01/27/2024 6:40 AM End time: 01/27/2024 6:50 AM Injection made incrementally with aspirations every 5 mL.  Performed by: Personally  Anesthesiologist: Gaynelle Adu, MD  Additional Notes:

## 2024-01-27 NOTE — Progress Notes (Signed)
Orthopedic Tech Progress Note Patient Details:  Donna Spears [birth year] [id number]  CPM Right Knee CPM Right Knee: Off Right Knee Flexion (Degrees): 40 Right Knee Extension (Degrees): 10  Post Interventions Patient Tolerated: Poor  [name] 01/27/2024, 2:26 PM

## 2024-01-27 NOTE — Anesthesia Procedure Notes (Signed)
Spinal  Patient location during procedure: OR Start time: 01/27/2024 7:15 AM End time: 01/27/2024 7:18 AM Reason for block: surgical anesthesia Staffing Performed: anesthesiologist  Anesthesiologist: Gaynelle Adu, MD Performed by: Gaynelle Adu, MD Authorized by: Gaynelle Adu, MD   Preanesthetic Checklist Completed: patient identified, IV checked, risks and benefits discussed, surgical consent, monitors and equipment checked, pre-op evaluation and timeout performed Spinal Block Patient position: sitting Prep: DuraPrep Patient monitoring: cardiac monitor, continuous pulse ox and blood pressure Approach: midline Location: L3-4 Injection technique: single-shot Needle Needle type: Pencan  Needle gauge: 24 G Needle length: 9 cm Assessment Sensory level: T8 Events: CSF return Additional Notes Functioning IV was confirmed and monitors were applied. Sterile prep and drape, including hand hygiene and sterile gloves were used. The patient was positioned and the spine was prepped. The skin was anesthetized with lidocaine.  Free flow of clear CSF was obtained prior to injecting local anesthetic into the CSF.  The spinal needle aspirated freely following injection.  The needle was carefully withdrawn.  The patient tolerated the procedure well.

## 2024-01-27 NOTE — Progress Notes (Signed)
Orthopedic Tech Progress Note Patient Details:  Donna Spears 02/11/1956 [id number]  Patient ID: [name], female   DOB: March 03, 1956, 69 y.o.   MRN: [id number] Unable to place patient in CPM at this time due to them being on a stretcher and not a hospital bed. [name] 01/27/2024, 9:21 AM

## 2024-01-28 ENCOUNTER — Other Ambulatory Visit (HOSPITAL_COMMUNITY): Payer: Self-pay

## 2024-01-28 ENCOUNTER — Encounter (HOSPITAL_COMMUNITY): Payer: Self-pay | Admitting: Orthopedic Surgery

## 2024-01-28 DIAGNOSIS — M1711 Unilateral primary osteoarthritis, right knee: Secondary | ICD-10-CM | POA: Diagnosis not present

## 2024-01-28 LAB — CBC
HCT: 30.1 % — ABNORMAL LOW (ref 36.0–46.0)
Hemoglobin: 9.2 g/dL — ABNORMAL LOW (ref 12.0–15.0)
MCH: 25.6 pg — ABNORMAL LOW (ref 26.0–34.0)
MCHC: 30.6 g/dL (ref 30.0–36.0)
MCV: 83.8 fL (ref 80.0–100.0)
Platelets: 354 10*3/uL (ref 150–400)
RBC: 3.59 MIL/uL — ABNORMAL LOW (ref 3.87–5.11)
RDW: 15 % (ref 11.5–15.5)
WBC: 7.9 10*3/uL (ref 4.0–10.5)
nRBC: 0 % (ref 0.0–0.2)

## 2024-01-28 LAB — BASIC METABOLIC PANEL
Anion gap: 10 (ref 5–15)
BUN: 12 mg/dL (ref 8–23)
CO2: 27 mmol/L (ref 22–32)
Calcium: 8.7 mg/dL — ABNORMAL LOW (ref 8.9–10.3)
Chloride: 105 mmol/L (ref 98–111)
Creatinine, Ser: 0.43 mg/dL — ABNORMAL LOW (ref 0.44–1.00)
GFR, Estimated: >60 mL/min (ref >60)
Glucose, Bld: 106 mg/dL — ABNORMAL HIGH (ref 70–99)
Potassium: 3.4 mmol/L — ABNORMAL LOW (ref 3.5–5.1)
Sodium: 142 mmol/L (ref 135–145)

## 2024-01-28 LAB — GLUCOSE, CAPILLARY: Glucose-Capillary: 99 mg/dL (ref 70–99)

## 2024-01-28 MED ORDER — OXYCODONE HCL 5 MG PO TABS
5.0000 mg | ORAL_TABLET | Freq: Four times a day (QID) | ORAL | 0 refills | Status: DC | PRN
  Filled 2024-01-28: qty 42, 6d supply, fill #0

## 2024-01-28 MED ORDER — ONDANSETRON HCL 4 MG PO TABS
4.0000 mg | ORAL_TABLET | Freq: Four times a day (QID) | ORAL | 0 refills | Status: DC | PRN
  Filled 2024-01-28: qty 20, 5d supply, fill #0

## 2024-01-28 MED ORDER — ASPIRIN 81 MG PO CHEW
81.0000 mg | CHEWABLE_TABLET | Freq: Two times a day (BID) | ORAL | 0 refills | Status: AC
  Filled 2024-01-28: qty 40, 20d supply, fill #0

## 2024-01-28 MED ORDER — METHOCARBAMOL 500 MG PO TABS
500.0000 mg | ORAL_TABLET | Freq: Four times a day (QID) | ORAL | 0 refills | Status: DC | PRN
  Filled 2024-01-28: qty 40, 10d supply, fill #0

## 2024-01-28 MED ORDER — GABAPENTIN 300 MG PO CAPS
300.0000 mg | ORAL_CAPSULE | ORAL | 0 refills | Status: DC
  Filled 2024-01-28: qty 84, 30d supply, fill #0

## 2024-01-28 NOTE — Care Management Obs Status (Signed)
MEDICARE OBSERVATION STATUS NOTIFICATION   Patient Details  Name: Donna Spears MRN: [id number] Date of Birth: 05/22/1956   Medicare Observation Status Notification Given:  Hart Robinsons, LCSW 01/28/2024, 10:53 AM

## 2024-01-28 NOTE — Progress Notes (Signed)
 TOC meds in a secure bag delivered to pt in room by this RN

## 2024-01-28 NOTE — Progress Notes (Signed)
 Physical Therapy Treatment Patient Details Name: Donna Spears MRN: [id number] DOB: Feb 15, 1956 Today's Date: 01/28/2024   History of Present Illness 68 yo female s/p  R TKA on 01/27/24. PMH: depression, GERD, HLD    PT Comments  Pt progressing very well, pain controlled with activity. Meeting PT goals, has ramp/handicapped accessible home. Reviewed mobility as below and TKA HEP. Daughter, Rosina, present for session. Pt is ready to d/c with family assist prn   If plan is discharge home, recommend the following: A little help with walking and/or transfers;A little help with bathing/dressing/bathroom;Assistance with cooking/housework;Help with stairs or ramp for entrance   Can travel by private vehicle        Equipment Recommendations  Rolling walker (2 wheels)    Recommendations for Other Services       Precautions / Restrictions Precautions Precautions: Fall;Knee Precaution Comments: reviewed reasoning Restrictions Weight Bearing Restrictions Per Provider Order: No RLE Weight Bearing Per Provider Order: Weight bearing as tolerated     Mobility  Bed Mobility               General bed mobility comments: in recliner    Transfers Overall transfer level: Needs assistance Equipment used: Rolling walker (2 wheels) Transfers: Sit to/from Stand Sit to Stand: Contact guard assist           General transfer comment: CGA to rise and stabilize, cues for proper hand placement and RLE position    Ambulation/Gait Ambulation/Gait assistance: Contact guard assist, Supervision Gait Distance (Feet): 80 Feet Assistive device: Rolling walker (2 wheels) Gait Pattern/deviations: Step-to pattern, Decreased stance time - right       General Gait Details: cues for sequence, RW position; CGA to supervision for safety   Stairs             Wheelchair Mobility     Tilt Bed    Modified Rankin (Stroke Patients Only)       Balance           Standing balance support:  No upper extremity supported, During functional activity, Reliant on assistive device for balance Standing balance-Leahy Scale: Fair                              Cognition Arousal: Alert Behavior During Therapy: WFL for tasks assessed/performed Overall Cognitive Status: Within Functional Limits for tasks assessed                                          Exercises Total Joint Exercises Ankle Circles/Pumps: AROM, Both, 10 reps Quad Sets: AROM, Strengthening, Both, 10 reps Heel Slides: AAROM, 10 reps, Right Hip ABduction/ADduction: AROM, Right (2x) Straight Leg Raises: AAROM, AROM, Right, 10 reps, Strengthening    General Comments        Pertinent Vitals/Pain Pain Assessment Pain Assessment: 0-10 Pain Score: 4  Pain Location: right knee with activity Pain Descriptors / Indicators: Aching, Sore Pain Intervention(s): Limited activity within patient's tolerance, Monitored during session, Premedicated before session, Repositioned, Ice applied    Home Living                          Prior Function            PT Goals (current goals can now be found in the care plan section) Acute Rehab PT  Goals PT Goal Formulation: With patient Time For Goal Achievement: 02/03/24 Potential to Achieve Goals: Good Progress towards PT goals: Progressing toward goals    Frequency    7X/week      PT Plan      Co-evaluation              AM-PAC PT 6 Clicks Mobility   Outcome Measure  Help needed turning from your back to your side while in a flat bed without using bedrails?: A Little Help needed moving from lying on your back to sitting on the side of a flat bed without using bedrails?: A Little Help needed moving to and from a bed to a chair (including a wheelchair)?: A Little Help needed standing up from a chair using your arms (e.g., wheelchair or bedside chair)?: A Little Help needed to walk in hospital room?: A Little Help needed  climbing 3-5 steps with a railing? : A Little 6 Click Score: 18    End of Session Equipment Utilized During Treatment: Gait belt Activity Tolerance: Patient tolerated treatment well Patient left: in chair;with call bell/phone within reach;with chair alarm set;with family/visitor present   PT Visit Diagnosis: Other abnormalities of gait and mobility (R26.89)     Time: 8955-8881 PT Time Calculation (min) (ACUTE ONLY): 34 min  Charges:    $Gait Training: 8-22 mins $Therapeutic Exercise: 8-22 mins PT General Charges $$ ACUTE PT VISIT: 1 Visit                     Rexene, PT  Acute Rehab Dept [facility]) [phone]  01/28/2024    [facility] 01/28/2024, 11:29 AM

## 2024-01-28 NOTE — Progress Notes (Signed)
   Subjective: 1 Day Post-Op Procedure(s) (LRB): TOTAL KNEE ARTHROPLASTY (Right) Patient seen in rounds by Dr. Lequita Halt. Patient is well, and has had no acute complaints or problems. Denies SOB or chest pain. Denies calf pain. Foley cath removed this AM. Patient reports pain as moderate. Worked with physical therapy yesterday and ambulated 30'. We will continue physical therapy today.  Objective: Vital signs in last 24 hours: Temp:  [97.5 F (36.4 C)-98.4 F (36.9 C)] 97.7 F (36.5 C) (02/04 0541) Pulse Rate:  [74-90] 74 (02/04 0541) Resp:  [13-22] 17 (02/04 0541) BP: (109-143)/(60-75) 124/66 (02/04 0541) SpO2:  [89 %-98 %] 95 % (02/04 0541)  Intake/Output from previous day:  Intake/Output Summary (Last 24 hours) at 01/28/2024 0759 Last data filed at 01/28/2024 0541 Gross per 24 hour  Intake 3255.13 ml  Output 1450 ml  Net 1805.13 ml     Intake/Output this shift: No intake/output data recorded.  Labs: Recent Labs    01/28/24 0338  HGB 9.2*   Recent Labs    01/28/24 0338  WBC 7.9  RBC 3.59*  HCT 30.1*  PLT 354   Recent Labs    01/28/24 0338  NA 142  K 3.4*  CL 105  CO2 27  BUN 12  CREATININE 0.43*  GLUCOSE 106*  CALCIUM 8.7*   No results for input(s): "LABPT", "INR" in the last 72 hours.  Exam: General - Patient is Alert and Oriented Extremity - Neurologically intact Neurovascular intact Sensation intact distally Dorsiflexion/Plantar flexion intact Dressing - dressing C/D/I Motor Function - intact, moving foot and toes well on exam.  Past Medical History:  Diagnosis Date   Allergy    Anemia    Anxiety    Arthritis    Asthma    Depression    Diabetes mellitus without complication (HCC)    GERD (gastroesophageal reflux disease)    Hyperlipidemia     Assessment/Plan: 1 Day Post-Op Procedure(s) (LRB): TOTAL KNEE ARTHROPLASTY (Right) Principal Problem:   OA (osteoarthritis) of knee Active Problems:   Primary osteoarthritis of right  knee  Estimated body mass index is 28.38 kg/m as calculated from the following:   Height as of this encounter: 5\' 4"  (1.626 m).   Weight as of this encounter: 75 kg. Advance diet Up with therapy D/C IV fluids   Patient's anticipated LOS is less than 2 midnights, meeting these requirements: - Lives within 1 hour of care - Has a competent adult at home to recover with post-op - NO history of  - Chronic pain requiring opiods  - Diabetes  - Coronary Artery Disease  - Heart failure  - Heart attack  - Stroke  - DVT/VTE  - Cardiac arrhythmia  - Respiratory Failure/COPD  - Renal failure  - Anemia  - Advanced Liver disease  DVT Prophylaxis - Aspirin Weight bearing as tolerated.  Continue physical therapy. Expected discharge home today pending progress and if meeting patient goals. Scheduled for OPPT at [facility]. Follow-up in clinic in 2 weeks.  The PDMP database was reviewed today prior to any opioid medications being prescribed to this patient.  R. Arcola Jansky, PA-C Orthopedic Surgery 01/28/2024, 7:59 AM

## 2024-01-28 NOTE — Plan of Care (Signed)
Patient discharging home with daughter via private vehicle following AVS and discharge education. Patient and family verbalize understanding. Haydee Salter, RN 01/28/24 11:59 AM

## 2024-01-28 NOTE — Plan of Care (Signed)
  Problem: Education: Goal: Knowledge of General Education information will improve Description: Including pain rating scale, medication(s)/side effects and non-pharmacologic comfort measures Outcome: Progressing   Problem: Health Behavior/Discharge Planning: Goal: Ability to manage health-related needs will improve Outcome: Progressing   Problem: Clinical Measurements: Goal: Ability to maintain clinical measurements within normal limits will improve Outcome: Progressing Goal: Will remain free from infection Outcome: Progressing Goal: Diagnostic test results will improve Outcome: Progressing Goal: Respiratory complications will improve Outcome: Progressing Goal: Cardiovascular complication will be avoided Outcome: Progressing   Problem: Activity: Goal: Risk for activity intolerance will decrease Outcome: Progressing   Problem: Nutrition: Goal: Adequate nutrition will be maintained Outcome: Completed/Met   Problem: Coping: Goal: Level of anxiety will decrease Outcome: Progressing   Problem: Elimination: Goal: Will not experience complications related to bowel motility Outcome: Progressing Goal: Will not experience complications related to urinary retention Outcome: Progressing   Problem: Pain Managment: Goal: General experience of comfort will improve and/or be controlled Outcome: Progressing   Problem: Safety: Goal: Ability to remain free from injury will improve Outcome: Progressing   Problem: Skin Integrity: Goal: Risk for impaired skin integrity will decrease Outcome: Progressing   Problem: Education: Goal: Knowledge of the prescribed therapeutic regimen will improve Outcome: Progressing Goal: Individualized Educational Video(s) Outcome: Completed/Met   Problem: Activity: Goal: Ability to avoid complications of mobility impairment will improve Outcome: Progressing Goal: Range of joint motion will improve Outcome: Adequate for Discharge   Problem:  Clinical Measurements: Goal: Postoperative complications will be avoided or minimized Outcome: Progressing   Problem: Pain Management: Goal: Pain level will decrease with appropriate interventions Outcome: Progressing   Problem: Skin Integrity: Goal: Will show signs of wound healing Outcome: Progressing

## 2024-01-28 NOTE — Discharge Summary (Signed)
 Physician Discharge Summary   Patient ID: Donna Spears MRN: [id number] DOB/AGE: Jul 09, 1956 68 y.o.  Admit date: 01/27/2024 Discharge date: 01/28/2024  Primary Diagnosis: Osteoarthritis right knee   Admission Diagnoses:  Past Medical History:  Diagnosis Date   Allergy    Anemia    Anxiety    Arthritis    Asthma    Depression    Diabetes mellitus without complication (HCC)    GERD (gastroesophageal reflux disease)    Hyperlipidemia    Discharge Diagnoses:   Principal Problem:   OA (osteoarthritis) of knee Active Problems:   Primary osteoarthritis of right knee  Estimated body mass index is 28.38 kg/m as calculated from the following:   Height as of this encounter: 5' 4 (1.626 m).   Weight as of this encounter: 75 kg.  Procedure:  Procedure(s) (LRB): TOTAL KNEE ARTHROPLASTY (Right)   Consults: None  HPI: Donna Spears is a 68 y.o. year old female with end stage OA of her right knee with progressively worsening pain and dysfunction. She has constant pain, with activity and at rest and significant functional deficits with difficulties even with ADLs. She has had extensive non-op management including analgesics, injections of cortisone and viscosupplements, and home exercise program, but remains in significant pain with significant dysfunction.Radiographs show bone on bone arthritis medial and patellofemoral. She presents now for right Total Knee Arthroplasty.   Laboratory Data: Admission on 01/27/2024, Discharged on 01/28/2024  Component Date Value Ref Range Status   Glucose-Capillary 01/27/2024 125 (H)  70 - 99 mg/dL Final   Glucose reference range applies only to samples taken after fasting for at least 8 hours.   Glucose-Capillary 01/27/2024 134 (H)  70 - 99 mg/dL Final   Glucose reference range applies only to samples taken after fasting for at least 8 hours.   Glucose-Capillary 01/27/2024 173 (H)  70 - 99 mg/dL Final   Glucose reference range applies only to  samples taken after fasting for at least 8 hours.   WBC 01/28/2024 7.9  4.0 - 10.5 K/uL Final   RBC 01/28/2024 3.59 (L)  3.87 - 5.11 MIL/uL Final   Hemoglobin 01/28/2024 9.2 (L)  12.0 - 15.0 g/dL Final   HCT 97/95/7974 30.1 (L)  36.0 - 46.0 % Final   MCV 01/28/2024 83.8  80.0 - 100.0 fL Final   MCH 01/28/2024 25.6 (L)  26.0 - 34.0 pg Final   MCHC 01/28/2024 30.6  30.0 - 36.0 g/dL Final   RDW 97/95/7974 15.0  11.5 - 15.5 % Final   Platelets 01/28/2024 354  150 - 400 K/uL Final   nRBC 01/28/2024 0.0  0.0 - 0.2 % Final   Performed at [facility], [address] [place], [place] [address]   Sodium 01/28/2024 142  135 - 145 mmol/L Final   Potassium 01/28/2024 3.4 (L)  3.5 - 5.1 mmol/L Final   Chloride 01/28/2024 105  98 - 111 mmol/L Final   CO2 01/28/2024 27  22 - 32 mmol/L Final   Glucose, Bld 01/28/2024 106 (H)  70 - 99 mg/dL Final   Glucose reference range applies only to samples taken after fasting for at least 8 hours.   BUN 01/28/2024 12  8 - 23 mg/dL Final   Creatinine, Ser 01/28/2024 0.43 (L)  0.44 - 1.00 mg/dL Final   Calcium  01/28/2024 8.7 (L)  8.9 - 10.3 mg/dL Final   GFR, Estimated 01/28/2024 >60  >60 mL/min Final   Comment: (NOTE) Calculated using the CKD-EPI Creatinine  Equation (2021)    Anion gap 01/28/2024 10  5 - 15 Final   Performed at [facility], [address], [place], [place] [address]   Glucose-Capillary 01/27/2024 174 (H)  70 - 99 mg/dL Final   Glucose reference range applies only to samples taken after fasting for at least 8 hours.   Glucose-Capillary 01/27/2024 168 (H)  70 - 99 mg/dL Final   Glucose reference range applies only to samples taken after fasting for at least 8 hours.   Glucose-Capillary 01/28/2024 99  70 - 99 mg/dL Final   Glucose reference range applies only to samples taken after fasting for at least 8 hours.  Hospital Outpatient Visit on 01/20/2024  Component Date Value Ref Range Status   MRSA, PCR  01/20/2024 NEGATIVE  NEGATIVE Final   Staphylococcus aureus 01/20/2024 NEGATIVE  NEGATIVE Final   Comment: (NOTE) The Xpert SA Assay (FDA approved for NASAL specimens in patients 34 years of age and older), is one component of a comprehensive surveillance program. It is not intended to diagnose infection nor to guide or monitor treatment. Performed at [facility], [address] [place], [place] [address]    Hgb A1c MFr Bld 01/20/2024 6.4 (H)  4.8 - 5.6 % Final   Comment: (NOTE) Pre diabetes:          5.7%-6.4%  Diabetes:              >6.4%  Glycemic control for   <7.0% adults with diabetes    Mean Plasma Glucose 01/20/2024 136.98  mg/dL Final   Performed at [facility], [address] [place], [place] [address]   Sodium 01/20/2024 140  135 - 145 mmol/L Final   Potassium 01/20/2024 3.5  3.5 - 5.1 mmol/L Final   Chloride 01/20/2024 104  98 - 111 mmol/L Final   CO2 01/20/2024 24  22 - 32 mmol/L Final   Glucose, Bld 01/20/2024 145 (H)  70 - 99 mg/dL Final   Glucose reference range applies only to samples taken after fasting for at least 8 hours.   BUN 01/20/2024 14  8 - 23 mg/dL Final   Creatinine, Ser 01/20/2024 0.33 (L)  0.44 - 1.00 mg/dL Final   Calcium  01/20/2024 9.3  8.9 - 10.3 mg/dL Final   GFR, Estimated 01/20/2024 >60  >60 mL/min Final   Comment: (NOTE) Calculated using the CKD-EPI Creatinine Equation (2021)    Anion gap 01/20/2024 12  5 - 15 Final   Performed at [facility], [address], [place], [place] [address]   WBC 01/20/2024 9.2  4.0 - 10.5 K/uL Final   RBC 01/20/2024 4.12  3.87 - 5.11 MIL/uL Final   Hemoglobin 01/20/2024 10.8 (L)  12.0 - 15.0 g/dL Final   HCT 98/72/7974 34.7 (L)  36.0 - 46.0 % Final   MCV 01/20/2024 84.2  80.0 - 100.0 fL Final   MCH 01/20/2024 26.2  26.0 - 34.0 pg Final   MCHC 01/20/2024 31.1  30.0 - 36.0 g/dL Final   RDW 98/72/7974 15.2  11.5 - 15.5 % Final   Platelets 01/20/2024 348  150 -  400 K/uL Final   nRBC 01/20/2024 0.0  0.0 - 0.2 % Final   Performed at [facility], [address], [place], [place] [address]   Glucose-Capillary 01/20/2024 130 (H)  70 - 99 mg/dL Final   Glucose reference range applies only to samples taken after fasting for at least 8 hours.  Appointment on 01/14/2024  Component Date Value Ref Range Status   S' Lateral 01/14/2024 2.20  cm Final   Area-P 1/2 01/14/2024 2.91  cm2 Final   Est EF 01/14/2024 65   Final  Office Visit on 12/13/2023  Component Date Value Ref Range Status   Glucose, Bld 12/13/2023 135 (H)  65 - 99 mg/dL Final   Comment: .            Fasting reference interval . For someone without known diabetes, a glucose value >125 mg/dL indicates that they may have diabetes and this should be confirmed with a follow-up test. .    BUN 12/13/2023 12  7 - 25 mg/dL Final   Creat 87/79/7975 0.46 (L)  0.50 - 1.05 mg/dL Final   eGFR 87/79/7975 104  > OR = 60 mL/min/1.83m2 Final   BUN/Creatinine Ratio 12/13/2023 26 (H)  6 - 22 (calc) Final   Sodium 12/13/2023 143  135 - 146 mmol/L Final   Potassium 12/13/2023 4.5  3.5 - 5.3 mmol/L Final   Chloride 12/13/2023 102  98 - 110 mmol/L Final   CO2 12/13/2023 28  20 - 32 mmol/L Final   Calcium  12/13/2023 9.2  8.6 - 10.4 mg/dL Final   Total Protein 87/79/7975 7.0  6.1 - 8.1 g/dL Final   Albumin 87/79/7975 3.4 (L)  3.6 - 5.1 g/dL Final   Globulin 87/79/7975 3.6  1.9 - 3.7 g/dL (calc) Final   AG Ratio 12/13/2023 0.9 (L)  1.0 - 2.5 (calc) Final   Total Bilirubin 12/13/2023 0.5  0.2 - 1.2 mg/dL Final   Alkaline phosphatase (APISO) 12/13/2023 86  37 - 153 U/L Final   AST 12/13/2023 13  10 - 35 U/L Final   ALT 12/13/2023 16  6 - 29 U/L Final   Rheumatoid fact SerPl-aCnc 12/13/2023 14 (H)  <14 IU/mL Final   Uric Acid, Serum 12/13/2023 4.0  2.5 - 7.0 mg/dL Final   Comment: Therapeutic target for gout patients: <6.0 mg/dL .    Sed Rate 12/13/2023 58 (H)  0 - 30 mm/h Final   Anti  Nuclear Antibody (ANA) 12/13/2023 POSITIVE (A)  NEGATIVE Final   Comment: ANA IFA is a first line screen for detecting the presence of up to approximately 150 autoantibodies in various autoimmune diseases. A positive ANA IFA result is suggestive of autoimmune disease and reflexes to titer and pattern. Further laboratory testing may be considered if clinically indicated. . For additional information, please refer to http://education.[facility].com/faq/FAQ177 (This link is being provided for informational/ educational purposes only.) .    Cyclic Citrullin Peptide Ab 87/79/7975 <16  UNITS Final   Comment: Reference Range Negative:            <20 Weak Positive:       20-39 Moderate Positive:   40-59 Strong Positive:     >59 .    ANA Titer 1 12/13/2023 1:80 (H)  titer Final   Comment: A low level ANA titer may be present in pre-clinical autoimmune diseases and normal individuals.                 Reference Range                 <1:40        Negative                 1:40-1:80    Low Antibody Level                 >  1:80        Elevated Antibody Level .    ANA Pattern 1 12/13/2023 Nuclear, Homogeneous (A)   Final   Comment: Homogeneous pattern is associated with systemic lupus erythematosus (SLE), drug-induced lupus and juvenile idiopathic arthritis. . AC-1: Homogeneous . International Consensus on ANA Patterns ([email or web address])      X-Rays:ECHOCARDIOGRAM COMPLETE Result Date: 01/14/2024    ECHOCARDIOGRAM REPORT   Patient Name:   Donna Spears   Date of Exam: 01/14/2024 Medical Rec #:  [id number]     Height:       63.0 in Accession #:    [id number]    Weight:       166.2 lb Date of Birth:  14-May-1956     BSA:          1.787 m Patient Age:    67 years      BP:           120/80 mmHg Patient Gender: F             HR:           80 bpm. Exam Location:  [address] Procedure: 2D Echo, Color Doppler, Cardiac Doppler, 3D Echo and Strain Analysis Indications:     Bilateral Leg Edema R60.0  History:        Patient has no prior history of Echocardiogram examinations.                 Risk Factors:Dyslipidemia and Diabetes.  Sonographer:    Augustin Seals RDCS Referring Phys: [phone] PHILIP J NAHSER IMPRESSIONS  1. Valsalva performed (for hyperdynamic ventricle) with no evidence of inducible LVOT gradient. Left ventricular ejection fraction, by estimation, is 65%. Left ventricular ejection fraction by 3D volume is 64 %. The left ventricle has normal function. The left ventricle has no regional wall motion abnormalities. Left ventricular diastolic parameters are consistent with Grade I diastolic dysfunction (impaired relaxation). The average left ventricular global longitudinal strain is -21.7 %. The global longitudinal strain is normal.  2. Right ventricular systolic function is hyperdynamic. The right ventricular size is normal. There is normal pulmonary artery systolic pressure.  3. The mitral valve is normal in structure. No evidence of mitral valve regurgitation. No evidence of mitral stenosis.  4. The aortic valve was not well visualized. There is mild calcification of the aortic valve. Aortic valve regurgitation is not visualized. Aortic valve sclerosis is present, with no evidence of aortic valve stenosis.  5. The inferior vena cava is normal in size with greater than 50% respiratory variability, suggesting right atrial pressure of 3 mmHg. Comparison(s): No prior Echocardiogram. FINDINGS  Left Ventricle: Valsalva performed (for hyperdynamic ventricle) with no evidence of inducible LVOT gradient. Left ventricular ejection fraction, by estimation, is 65%. Left ventricular ejection fraction by 3D volume is 64 %. The left ventricle has normal function. The left ventricle has no regional wall motion abnormalities. The average left ventricular global longitudinal strain is -21.7 %. The global longitudinal strain is normal. The left ventricular internal cavity size was normal in  size. There is no left ventricular hypertrophy. Left ventricular diastolic parameters are consistent with Grade I diastolic dysfunction (impaired relaxation). Right Ventricle: The right ventricular size is normal. No increase in right ventricular wall thickness. Right ventricular systolic function is hyperdynamic. There is normal pulmonary artery systolic pressure. The tricuspid regurgitant velocity is 2.58 m/s, and with an assumed right atrial pressure of 3 mmHg, the estimated right ventricular systolic pressure is 29.6 mmHg. Left Atrium:  Left atrial size was normal in size. Right Atrium: Right atrial size was normal in size. Pericardium: There is no evidence of pericardial effusion. Mitral Valve: The mitral valve is normal in structure. No evidence of mitral valve regurgitation. No evidence of mitral valve stenosis. Tricuspid Valve: The tricuspid valve is normal in structure. Tricuspid valve regurgitation is not demonstrated. No evidence of tricuspid stenosis. Aortic Valve: The aortic valve was not well visualized. There is mild calcification of the aortic valve. Aortic valve regurgitation is not visualized. Aortic valve sclerosis is present, with no evidence of aortic valve stenosis. Pulmonic Valve: The pulmonic valve was not well visualized. Pulmonic valve regurgitation is not visualized. No evidence of pulmonic stenosis. Aorta: The aortic root, ascending aorta and aortic arch are all structurally normal, with no evidence of dilitation or obstruction. Venous: The inferior vena cava is normal in size with greater than 50% respiratory variability, suggesting right atrial pressure of 3 mmHg. IAS/Shunts: The atrial septum is grossly normal.  LEFT VENTRICLE PLAX 2D LVIDd:         4.10 cm         Diastology LVIDs:         2.20 cm         LV e' medial:    6.64 cm/s LV PW:         0.90 cm         LV E/e' medial:  13.7 LV IVS:        1.00 cm         LV e' lateral:   7.29 cm/s LVOT diam:     2.10 cm         LV E/e'  lateral: 12.4 LV SV:         107 LV SV Index:   60              2D LVOT Area:     3.46 cm        Longitudinal                                Strain                                2D Strain GLS  -21.7 %                                Avg:                                 3D Volume EF                                LV 3D EF:    Left                                             ventricul  ar                                             ejection                                             fraction                                             by 3D                                             volume is                                             64 %.                                 3D Volume EF:                                3D EF:        64 %                                LV EDV:       118 ml                                LV ESV:       43 ml                                LV SV:        75 ml RIGHT VENTRICLE RV Basal diam:  3.80 cm RV Mid diam:    3.60 cm RV S prime:     18.60 cm/s TAPSE (M-mode): 2.2 cm LEFT ATRIUM             Index        RIGHT ATRIUM           Index LA diam:        3.40 cm 1.90 cm/m   RA Area:     14.00 cm LA Vol (A2C):   46.8 ml 26.18 ml/m  RA Volume:   29.50 ml  16.50 ml/m LA Vol (A4C):   45.3 ml 25.34 ml/m LA Biplane Vol: 47.2 ml 26.41 ml/m  AORTIC VALVE LVOT Vmax:   144.00 cm/s LVOT Vmean:  97.800 cm/s LVOT VTI:    0.308 m  AORTA Ao Root diam: 2.80 cm Ao Asc diam:  3.40 cm MITRAL VALVE  TRICUSPID VALVE MV Area (PHT): 2.91 cm     TR Peak grad:   26.6 mmHg MV Decel Time: 261 msec     TR Vmax:        258.00 cm/s MV E velocity: 90.70 cm/s MV A velocity: 134.00 cm/s  SHUNTS MV E/A ratio:  0.68         Systemic VTI:  0.31 m                             Systemic Diam: 2.10 cm Stanly Leavens MD Electronically signed by Stanly Leavens MD Signature Date/Time: 01/14/2024/12:05:19 PM    Final     EKG: Orders placed or performed in  visit on 01/06/24   EKG 12-Lead     Hospital Course: Jasie Meleski is a 68 y.o. who was [facility]. They were brought to the operating room on 01/27/2024 and underwent Procedure(s): TOTAL KNEE ARTHROPLASTY.  Patient tolerated the procedure well and was later transferred to the recovery room and then to the orthopaedic floor for postoperative care. They were given PO and IV analgesics for pain control following their surgery. They were given 24 hours of postoperative antibiotics of  Anti-infectives (From admission, onward)    Start     Dose/Rate Route Frequency Ordered Stop   01/27/24 1300  ceFAZolin  (ANCEF ) IVPB 2g/100 mL premix        2 g 200 mL/hr over 30 Minutes Intravenous Every 6 hours 01/27/24 1018 01/27/24 1930   01/27/24 0600  ceFAZolin  (ANCEF ) IVPB 2g/100 mL premix        2 g 200 mL/hr over 30 Minutes Intravenous On call to O.R. 01/27/24 9451 01/27/24 0708     and started on DVT prophylaxis in the form of Aspirin .   PT and OT were ordered for total joint protocol. Discharge planning consulted to help with postop disposition and equipment needs.  Patient had a fair night on the evening of surgery. They started to get up OOB with therapy on POD #0. Pt was seen during rounds and was ready to go home pending progress with therapy. She worked with therapy on POD #1 and was meeting her goals. Pt was discharged to home later that day in stable condition.  Diet: Regular diet Activity: WBAT Follow-up: in 2 weeks Disposition: Home Discharged Condition: stable   Discharge Instructions     Call MD / Call 911   Complete by: As directed    If you experience chest pain or shortness of breath, CALL 911 and be transported to the hospital emergency room.  If you develope a fever above 101 F, pus (white drainage) or increased drainage or redness at the wound, or calf pain, call your surgeon's office.   Change dressing   Complete by: As directed    You may remove the  bulky bandage (ACE wrap and gauze) two days after surgery. You will have an adhesive waterproof bandage underneath. Leave this in place until your first follow-up appointment.   Constipation Prevention   Complete by: As directed    Drink plenty of fluids.  Prune juice may be helpful.  You may use a stool softener, such as Colace (over the counter) 100 mg twice a day.  Use MiraLax  (over the counter) for constipation as needed.   Diet - low sodium heart healthy   Complete by: As directed    Do not put a pillow under the knee.  Place it under the heel.   Complete by: As directed    Driving restrictions   Complete by: As directed    No driving for two weeks   Post-operative opioid taper instructions:   Complete by: As directed    POST-OPERATIVE OPIOID TAPER INSTRUCTIONS: It is important to wean off of your opioid medication as soon as possible. If you do not need pain medication after your surgery it is ok to stop day one. Opioids include: Codeine, Hydrocodone (Norco, Vicodin), Oxycodone (Percocet, oxycontin ) and hydromorphone  amongst others.  Long term and even short term use of opiods can cause: Increased pain response Dependence Constipation Depression Respiratory depression And more.  Withdrawal symptoms can include Flu like symptoms Nausea, vomiting And more Techniques to manage these symptoms Hydrate well Eat regular healthy meals Stay active Use relaxation techniques(deep breathing, meditating, yoga) Do Not substitute Alcohol to help with tapering If you have been on opioids for less than two weeks and do not have pain than it is ok to stop all together.  Plan to wean off of opioids This plan should start within one week post op of your joint replacement. Maintain the same interval or time between taking each dose and first decrease the dose.  Cut the total daily intake of opioids by one tablet each day Next start to increase the time between doses. The last dose that should  be eliminated is the evening dose.      TED hose   Complete by: As directed    Use stockings (TED hose) for three weeks on both leg(s).  You may remove them at night for sleeping.   Weight bearing as tolerated   Complete by: As directed       Allergies as of 01/28/2024       Reactions   Azithromycin  Nausea And Vomiting   Erythromycin Diarrhea   Other         Medication List     STOP taking these medications    meloxicam  15 MG tablet Commonly known as: MOBIC        TAKE these medications    Aspirin  Low Dose 81 MG chewable tablet Generic drug: aspirin  Chew 1 tablet (81 mg total) by mouth 2 (two) times daily for 20 days. Then take one 81 mg aspirin  once a day for three weeks. Then discontinue aspirin . Start taking on: January 30, 2024   atorvastatin  40 MG tablet Commonly known as: LIPITOR TAKE 1 TABLET BY MOUTH DAILY What changed: when to take this   clobetasol  cream 0.05 % Commonly known as: TEMOVATE  Apply 1 application topically 2 (two) times daily.   fluticasone  50 MCG/ACT nasal spray Commonly known as: FLONASE  USE 1 SPRAY IN BOTH NOSTRILS  TWICE DAILY   fluticasone -salmeterol 250-50 MCG/ACT Aepb Commonly known as: ADVAIR USE 1 INHALATION BY MOUTH EVERY  12 HOURS   furosemide  40 MG tablet Commonly known as: LASIX  TAKE 1 TABLET BY MOUTH EVERY DAY   gabapentin  300 MG capsule Commonly known as: NEURONTIN  Take 1 capsule (300 mg total) by mouth as directed. Take a 300 mg capsule three times a day for two weeks following surgery.Then take a 300 mg capsule two times a day for two weeks. Then take a 300 mg capsule once a day for two weeks. Then discontinue.   metFORMIN  500 MG tablet Commonly known as: GLUCOPHAGE  TAKE 1 TABLET BY MOUTH TWICE  DAILY WITH MEALS   methocarbamol  500 MG tablet Commonly known as: ROBAXIN  Take 1 tablet (500 mg total)  by mouth every 6 (six) hours as needed for muscle spasms. Please deliver to room 1331 prior to discharge.    multivitamin with minerals Tabs tablet Take 1 tablet by mouth at bedtime.   ondansetron  4 MG tablet Commonly known as: ZOFRAN  Take 1 tablet (4 mg total) by mouth every 6 (six) hours as needed for nausea.   oxyCODONE  5 MG immediate release tablet Commonly known as: Oxy IR/ROXICODONE  Take 1-2 tablets (5-10 mg total) by mouth every 6 (six) hours as needed for severe pain (pain score 7-10).   pantoprazole  40 MG tablet Commonly known as: PROTONIX  TAKE 1 TABLET BY MOUTH DAILY What changed: when to take this   TURMERIC PO Take 1 capsule by mouth at bedtime.   venlafaxine  75 MG tablet Commonly known as: EFFEXOR  TAKE 1 TABLET BY MOUTH DAILY What changed: when to take this   VITAMIN D -3 PO Take 1 tablet by mouth at bedtime.               Discharge Care Instructions  (From admission, onward)           Start     Ordered   01/28/24 0000  Weight bearing as tolerated        01/28/24 0801   01/28/24 0000  Change dressing       Comments: You may remove the bulky bandage (ACE wrap and gauze) two days after surgery. You will have an adhesive waterproof bandage underneath. Leave this in place until your first follow-up appointment.   01/28/24 0801            Follow-up Information     Melodi Lerner, MD. Schedule an appointment as soon as possible for a visit in 2 day(s).   Specialty: Orthopedic Surgery Contact information: [address] [place] [address] [phone]                 Signed: R. Zelda Kobs, PA-C Orthopedic Surgery 01/28/2024, 12:30 PM

## 2024-01-28 NOTE — TOC Transition Note (Signed)
 Transition of Care [facility]) - Discharge Note   Patient Details  Name: Jezabella Schriever MRN: [id number] Date of Birth: [birth year]  Transition of Care [facility]) CM/SW Contact:  NORMAN ASPEN, LCSW Phone Number: 01/28/2024, 10:53 AM   Clinical Narrative:     Met with pt today and confirming she has received RW to room via [employer].  OPPT already arranged with [facility].  No further TOC needs.   Final next level of care: OP Rehab Barriers to Discharge: No Barriers Identified   Patient Goals and CMS Choice Patient states their goals for this hospitalization and ongoing recovery are:: return home          Discharge Placement                       Discharge Plan and Services Additional resources added to the After Visit Summary for                  DME Arranged: Walker rolling DME Agency: [employer]                  Social Drivers of Health (SDOH) Interventions SDOH Screenings   Food Insecurity: No Food Insecurity (01/27/2024)  Housing: Low Risk  (01/27/2024)  Transportation Needs: No Transportation Needs (01/27/2024)  Utilities: Not At Risk (01/27/2024)  Alcohol Screen: Low Risk  (12/05/2022)  Depression (PHQ2-9): Low Risk  (12/13/2023)  Financial Resource Strain: Low Risk  (12/05/2022)  Physical Activity: Insufficiently Active (12/05/2022)  Social Connections: Moderately Integrated (01/27/2024)  Stress: No Stress Concern Present (12/05/2022)  Tobacco Use: Low Risk  (01/27/2024)     Readmission Risk Interventions     No data to display

## 2024-02-18 ENCOUNTER — Other Ambulatory Visit (HOSPITAL_COMMUNITY): Payer: Self-pay

## 2024-03-10 ENCOUNTER — Other Ambulatory Visit: Payer: Self-pay | Admitting: Internal Medicine

## 2024-04-16 ENCOUNTER — Other Ambulatory Visit: Payer: Self-pay | Admitting: Internal Medicine

## 2024-04-16 DIAGNOSIS — K219 Gastro-esophageal reflux disease without esophagitis: Secondary | ICD-10-CM

## 2024-05-11 ENCOUNTER — Encounter: Payer: Self-pay | Admitting: Internal Medicine

## 2024-05-11 ENCOUNTER — Ambulatory Visit (INDEPENDENT_AMBULATORY_CARE_PROVIDER_SITE_OTHER): Admitting: Internal Medicine

## 2024-05-11 VITALS — BP 140/90 | HR 78 | Ht 63.0 in | Wt 164.0 lb

## 2024-05-11 DIAGNOSIS — M2540 Effusion, unspecified joint: Secondary | ICD-10-CM

## 2024-05-11 DIAGNOSIS — M19042 Primary osteoarthritis, left hand: Secondary | ICD-10-CM

## 2024-05-11 DIAGNOSIS — E782 Mixed hyperlipidemia: Secondary | ICD-10-CM

## 2024-05-11 DIAGNOSIS — Z8659 Personal history of other mental and behavioral disorders: Secondary | ICD-10-CM

## 2024-05-11 DIAGNOSIS — M25541 Pain in joints of right hand: Secondary | ICD-10-CM

## 2024-05-11 DIAGNOSIS — M25542 Pain in joints of left hand: Secondary | ICD-10-CM

## 2024-05-11 DIAGNOSIS — Z1231 Encounter for screening mammogram for malignant neoplasm of breast: Secondary | ICD-10-CM

## 2024-05-11 DIAGNOSIS — F439 Reaction to severe stress, unspecified: Secondary | ICD-10-CM

## 2024-05-11 DIAGNOSIS — Z96651 Presence of right artificial knee joint: Secondary | ICD-10-CM

## 2024-05-11 DIAGNOSIS — R609 Edema, unspecified: Secondary | ICD-10-CM | POA: Diagnosis not present

## 2024-05-11 DIAGNOSIS — M19041 Primary osteoarthritis, right hand: Secondary | ICD-10-CM

## 2024-05-11 DIAGNOSIS — E7439 Other disorders of intestinal carbohydrate absorption: Secondary | ICD-10-CM

## 2024-05-11 NOTE — Progress Notes (Signed)
 Patient Care Team: Sylvan Evener, MD as PCP - General (Internal Medicine) Nahser, Lela Purple, MD as PCP - Cardiology (Cardiology)  Visit Date: 05/11/24  Subjective:   Chief Complaint  Patient presents with   hand stiffness   Vitals:   05/11/24 1547 05/11/24 1554 05/11/24 1559  BP: (!) 150/90 (!) 150/90 (!) 140/90   Patient AV:WUJWJ Neena Baller Lenhoff,Female DOB:[birth year],68 y.o. XBJ:[id number]   68 y.o.Female presents today for visit with Bilateral Hand Stiffness. Patient has a past medical history of Arthritis; Carpal Tunnel; Essential Tremor; S/p Right Knee Arthroplasty 01/27/2024 by Dr. France Ina, which she says she has recovered well from this though is having some issues with her knee not straightening and has been fitted with a brace. Today says that she has been having swelling in her hands and decreased ROM as she cannot close her hands, affecting her ability to cook. Denies numbness.   Hx right lumbar radiculopathy Sept 2022. MRI LS spine  MRI was recommended to patient. Subsequently was seen at [facility] and treated conservatively.  12/13/2023 RA Labs: CCP  <16 ANA  POSITIVE Sed Rate  58 Uric Acid  4.0 Rheumatoid Factor  14 Past Medical History:  Diagnosis Date   Allergy    Anemia    Anxiety    Arthritis    Asthma    Depression    Diabetes mellitus without complication (HCC)    GERD (gastroesophageal reflux disease)    Hyperlipidemia     Allergies  Allergen Reactions   Azithromycin  Nausea And Vomiting   Erythromycin Diarrhea   Other     Family History  Problem Relation Age of Onset   Heart disease Mother    Hypertension Mother    Hyperlipidemia Father    Heart disease Brother    Hypertension Brother    Social Hx: married. Resides with husband who is disabled.  Review of Systems  Musculoskeletal:  Positive for joint pain (hands bilaterally).       (+) Decreased ROM in hands - can not fully close  Neurological:  Negative for tingling (/numbness).      Objective:  Vitals: BP (!) 140/90   Pulse 78   Ht 5\' 3"  (1.6 m)   Wt 164 lb (74.4 kg)   SpO2 98%   BMI 29.05 kg/m   Physical Exam Vitals and nursing note reviewed.  Constitutional:      General: She is not in acute distress.    Appearance: Normal appearance. She is not toxic-appearing.  HENT:     Head: Normocephalic and atraumatic.  Pulmonary:     Effort: Pulmonary effort is normal.  Musculoskeletal:     Right hand: Swelling and bony tenderness present. Decreased range of motion (can not fully close hands).     Left hand: Swelling and bony tenderness present. Decreased range of motion (can not fully close hands).  Skin:    General: Skin is warm and dry.  Neurological:     Mental Status: She is alert and oriented to person, place, and time. Mental status is at baseline.  Psychiatric:        Mood and Affect: Mood normal.        Behavior: Behavior normal.        Thought Content: Thought content normal.        Judgment: Judgment normal.     Results:  Studies Obtained And Personally Reviewed By Me: Labs:     Component Value Date/Time   NA 142 01/28/2024 0338  K 3.4 (L) 01/28/2024 0338   CL 105 01/28/2024 0338   CO2 27 01/28/2024 0338   GLUCOSE 106 (H) 01/28/2024 0338   BUN 12 01/28/2024 0338   CREATININE 0.43 (L) 01/28/2024 0338   CREATININE 0.46 (L) 12/13/2023 1108   CALCIUM  8.7 (L) 01/28/2024 0338   PROT 7.0 12/13/2023 1108   ALBUMIN 3.9 01/17/2017 1023   AST 13 12/13/2023 1108   ALT 16 12/13/2023 1108   ALKPHOS 87 01/17/2017 1023   BILITOT 0.5 12/13/2023 1108   GFRNONAA >60 01/28/2024 0338   GFRNONAA 95 09/19/2020 1116   GFRAA 110 09/19/2020 1116    Lab Results  Component Value Date   WBC 7.9 01/28/2024   HGB 9.2 (L) 01/28/2024   HCT 30.1 (L) 01/28/2024   MCV 83.8 01/28/2024   PLT 354 01/28/2024   Lab Results  Component Value Date   CHOL 184 03/14/2023   HDL 54 03/14/2023   LDLCALC 104 (H) 03/14/2023   TRIG 147 03/14/2023   CHOLHDL 3.4 03/14/2023    Lab Results  Component Value Date   HGBA1C 6.4 (H) 01/20/2024    Lab Results  Component Value Date   TSH 1.64 11/07/2023    Assessment & Plan:   Orders Placed This Encounter  Procedures   MM 3D SCREENING MAMMOGRAM BILATERAL BREAST   Basic Metabolic Panel   Cyclic citrul peptide antibody, IgG   Sedimentation rate   ANA   Ambulatory referral to Rheumatology  Arthralgia, Both Hands; Joint Swelling; Osteoarthritis, Both Hands; Fluid Retention: has been having swelling in her hands and decreased ROM as she cannot close her hands, affecting her ability to cook. Denies numbness. This has been going on since October 2024. She had labs done December, which can be reviewed below.   12/13/2023 RA Labs: CCP  <16 ANA  POSITIVE Sed Rate  58 Uric Acid  4.0 Rheumatoid Factor  14  S/p Right Knee Arthroplasty 01/27/2024 by Dr. France Ina, which she says she has recovered well from this though is having some issues with her knee not straightening and has been fitted with a brace.   Situational Stress   Glucose Intolerance w/ 11/06/2024  HgbA1c 6.8.    Mixed Hyperlipidemia w/ 03/14/2023 Lipid Panel: LDL 104.   History of Depression  Overdue for Mammogram since 2021, which was negative.  Elevated Blood Pressures today of 150/90, 150/90, and 140/90.     I,Emily Lagle,acting as a Neurosurgeon for Sylvan Evener, MD.,have documented all relevant documentation on the behalf of Sylvan Evener, MD,as directed by  Sylvan Evener, MD while in the presence of Sylvan Evener, MD.   I, Sylvan Evener, MD, have reviewed all documentation for this visit. The documentation on 05/12/24 for the exam, diagnosis, procedures, and orders are all accurate and complete.

## 2024-05-12 ENCOUNTER — Ambulatory Visit: Payer: Self-pay | Admitting: Internal Medicine

## 2024-05-12 NOTE — Patient Instructions (Addendum)
 CCP, sed rate, and ANA drawn today and pending. Suggesting Rheumatology referral for hand pain. May have Rheumaotid arthritis.

## 2024-05-16 LAB — ANA: Anti Nuclear Antibody (ANA): POSITIVE — AB

## 2024-05-16 LAB — CYCLIC CITRUL PEPTIDE ANTIBODY, IGG: Cyclic Citrullin Peptide Ab: <16 U

## 2024-05-16 LAB — ANTI-NUCLEAR AB-TITER (ANA TITER): ANA Titer 1: 1:80 {titer} — ABNORMAL HIGH

## 2024-05-16 LAB — BASIC METABOLIC PANEL WITH GFR
BUN/Creatinine Ratio: 28 (calc) — ABNORMAL HIGH (ref 6–22)
BUN: 13 mg/dL (ref 7–25)
CO2: 25 mmol/L (ref 20–32)
Calcium: 9.3 mg/dL (ref 8.6–10.4)
Chloride: 107 mmol/L (ref 98–110)
Creat: 0.47 mg/dL — ABNORMAL LOW (ref 0.50–1.05)
Glucose, Bld: 69 mg/dL (ref 65–99)
Potassium: 3.8 mmol/L (ref 3.5–5.3)
Sodium: 141 mmol/L (ref 135–146)
eGFR: 103 mL/min/{1.73_m2} (ref >=60)

## 2024-05-16 LAB — SEDIMENTATION RATE: Sed Rate: 39 mm/h — ABNORMAL HIGH (ref 0–30)

## 2024-06-03 ENCOUNTER — Other Ambulatory Visit: Payer: Self-pay | Admitting: Internal Medicine

## 2024-06-11 ENCOUNTER — Other Ambulatory Visit: Payer: Medicare Other

## 2024-06-11 DIAGNOSIS — E782 Mixed hyperlipidemia: Secondary | ICD-10-CM

## 2024-06-11 DIAGNOSIS — E119 Type 2 diabetes mellitus without complications: Secondary | ICD-10-CM

## 2024-06-12 LAB — MICROALBUMIN / CREATININE URINE RATIO
Creatinine, Urine: 134 mg/dL (ref 20–275)
Microalb Creat Ratio: 5 mg/g{creat} (ref <30)
Microalb, Ur: 0.7 mg/dL

## 2024-06-12 LAB — HEMOGLOBIN A1C
Hgb A1c MFr Bld: 6.6 % — ABNORMAL HIGH (ref <5.7)
Mean Plasma Glucose: 143 mg/dL
eAG (mmol/L): 7.9 mmol/L

## 2024-06-12 LAB — HEPATIC FUNCTION PANEL
AG Ratio: 1.2 (calc) (ref 1.0–2.5)
ALT: 17 U/L (ref 6–29)
AST: 13 U/L (ref 10–35)
Albumin: 3.5 g/dL — ABNORMAL LOW (ref 3.6–5.1)
Alkaline phosphatase (APISO): 89 U/L (ref 37–153)
Bilirubin, Direct: 0.1 mg/dL (ref 0.0–0.2)
Globulin: 3 g/dL (ref 1.9–3.7)
Indirect Bilirubin: 0.3 mg/dL (ref 0.2–1.2)
Total Bilirubin: 0.4 mg/dL (ref 0.2–1.2)
Total Protein: 6.5 g/dL (ref 6.1–8.1)

## 2024-06-12 LAB — LIPID PANEL
Cholesterol: 174 mg/dL (ref <200)
HDL: 50 mg/dL (ref >=50)
LDL Cholesterol (Calc): 105 mg/dL — ABNORMAL HIGH
Non-HDL Cholesterol (Calc): 124 mg/dL (ref <130)
Total CHOL/HDL Ratio: 3.5 (calc) (ref <5.0)
Triglycerides: 101 mg/dL (ref <150)

## 2024-06-16 ENCOUNTER — Ambulatory Visit: Payer: Medicare Other | Admitting: Internal Medicine

## 2024-06-16 VITALS — BP 138/88 | HR 79 | Temp 98.6°F | Ht 63.0 in | Wt 169.0 lb

## 2024-06-16 DIAGNOSIS — K219 Gastro-esophageal reflux disease without esophagitis: Secondary | ICD-10-CM

## 2024-06-16 DIAGNOSIS — M19041 Primary osteoarthritis, right hand: Secondary | ICD-10-CM

## 2024-06-16 DIAGNOSIS — M1711 Unilateral primary osteoarthritis, right knee: Secondary | ICD-10-CM | POA: Diagnosis not present

## 2024-06-16 DIAGNOSIS — F439 Reaction to severe stress, unspecified: Secondary | ICD-10-CM

## 2024-06-16 DIAGNOSIS — E785 Hyperlipidemia, unspecified: Secondary | ICD-10-CM

## 2024-06-16 DIAGNOSIS — Z96651 Presence of right artificial knee joint: Secondary | ICD-10-CM

## 2024-06-16 DIAGNOSIS — Z7984 Long term (current) use of oral hypoglycemic drugs: Secondary | ICD-10-CM | POA: Diagnosis not present

## 2024-06-16 DIAGNOSIS — E782 Mixed hyperlipidemia: Secondary | ICD-10-CM

## 2024-06-16 DIAGNOSIS — E119 Type 2 diabetes mellitus without complications: Secondary | ICD-10-CM | POA: Diagnosis not present

## 2024-06-16 DIAGNOSIS — Z1231 Encounter for screening mammogram for malignant neoplasm of breast: Secondary | ICD-10-CM

## 2024-06-16 DIAGNOSIS — Z8659 Personal history of other mental and behavioral disorders: Secondary | ICD-10-CM

## 2024-06-16 MED ORDER — SITAGLIPTIN PHOSPHATE 50 MG PO TABS: 50.0000 mg | ORAL_TABLET | Freq: Every day | ORAL | 0 refills | Status: DC

## 2024-06-16 NOTE — Progress Notes (Addendum)
 Patient Care Team: [name], MD as PCP - General (Internal Medicine) Nahser, [name], MD as PCP - Cardiology (Cardiology)  Visit Date: 06/16/24  Subjective:   Chief Complaint  Patient presents with   Follow-up    Patient states nothing to discuss.    Patient PI:Donna Spears, Donna Spears DOB:Aug 27, 1956,68 y.o. FMW:[id number]   68 y.o.Female presents today for 6 months follow-up for Hyperlipidemia; Diabetes Mellitus, type II; S/p Right TKA 01/27/2024. Patient has a past medical history of Arthritis, Right Knee. Seen for her annual visit on 12/13/2023, in the interim has seen at Cardiology and multiple times at [facility].   History of Osteoarthritis, Right Knee S/p Right TKA on 01/27/2024 by Dr. Melodi. She says that she has been stretching her knee twice daily and has noticed definite improvement since her procedure.   History of Hyperlipidemia treated with Atorvastatin  40 mg at bedtime. 06/11/2024 Lipid Panel: LDL 105, elevated from 104 in 02/2023; otherwise WNL.    Hx GERD treated with PPI  History of Diabetes Mellitus, type II treated with Metformin  500 mg twice daily and Januvia  50 mg daily. 06/11/2024 HgbA1c 6.6%, elevated from 6.4% in January 2025, decreased from 6.8% in November 2024.   Mammogram discussed; last completed 2021 was negative.  Past Medical History:  Diagnosis Date   Allergy    Anemia    Anxiety    Arthritis    Asthma    Depression    Diabetes mellitus without complication (HCC)    GERD (gastroesophageal reflux disease)    Hyperlipidemia     Allergies  Allergen Reactions   Azithromycin  Nausea And Vomiting   Erythromycin Diarrhea   Other    Past Surgical History:  Procedure Laterality Date   TOTAL KNEE ARTHROPLASTY Right 01/27/2024   Procedure: TOTAL KNEE ARTHROPLASTY;  Surgeon: Melodi Lerner, MD;  Location: [facility];  Service: Orthopedics;  Laterality: Right;   TRIGGER FINGER RELEASE      Family History  Problem Relation Age of Onset   Heart  disease Mother    Hypertension Mother    Hyperlipidemia Father    Heart disease Brother    Hypertension Brother    Social Hx: married. Husband is disabled and patient had a hard year with knee arthroplasty done Feb 2025 and trying to take care of husband.  Review of Systems  Constitutional:  Negative for fever and malaise/fatigue.  HENT:  Negative for congestion.   Eyes:  Negative for blurred vision.  Respiratory:  Negative for cough and shortness of breath.   Cardiovascular:  Negative for chest pain, palpitations and leg swelling.  Gastrointestinal:  Negative for vomiting.  Musculoskeletal:  Negative for back pain.  Skin:  Negative for rash.  Neurological:  Negative for loss of consciousness and headaches.     Objective:  Vitals: BP 138/88   Pulse 79   Temp 98.6 F (37 C) (Temporal)   Ht 5' 3 (1.6 m)   Wt 169 lb (76.7 kg)   SpO2 98%   BMI 29.94 kg/m   Physical Exam Vitals and nursing note reviewed.  Constitutional:      General: She is not in acute distress.    Appearance: Normal appearance. She is not toxic-appearing.  HENT:     Head: Normocephalic and atraumatic.   Cardiovascular:     Rate and Rhythm: Normal rate and regular rhythm. No extrasystoles are present.    Pulses: Normal pulses.          Dorsalis pedis pulses are 2+  on the right side and 2+ on the left side.       Posterior tibial pulses are 2+ on the right side and 2+ on the left side.     Heart sounds: Normal heart sounds. No murmur heard.    No friction rub. No gallop.  Pulmonary:     Effort: Pulmonary effort is normal. No respiratory distress.     Breath sounds: Normal breath sounds. No wheezing or rales.  Feet:     Right foot:     Skin integrity: Skin integrity normal. No ulcer, blister, skin breakdown, erythema, warmth, callus, dry skin or fissure.     Left foot:     Skin integrity: Skin integrity normal. No ulcer, blister, skin breakdown, erythema, warmth, callus, dry skin or fissure.      Comments: Sensation/Position sense intact  Skin:    General: Skin is warm and dry.   Neurological:     Mental Status: She is alert and oriented to person, place, and time. Mental status is at baseline.   Psychiatric:        Mood and Affect: Mood normal.        Behavior: Behavior normal.        Thought Content: Thought content normal.        Judgment: Judgment normal.     Results:  Studies Obtained And Personally Reviewed By Me:  Diabetic Foot Exam - Simple   Simple Foot Form Diabetic Foot exam was performed with the following findings: Yes 06/16/2024 11:45 AM  Visual Inspection No deformities, no ulcerations, no other skin breakdown bilaterally: Yes Sensation Testing Intact to touch and monofilament testing bilaterally: Yes Pulse Check Posterior Tibialis and Dorsalis pulse intact bilaterally: Yes Comments    Labs:     Component Value Date/Time   NA 141 05/11/2024 1624   K 3.8 05/11/2024 1624   CL 107 05/11/2024 1624   CO2 25 05/11/2024 1624   GLUCOSE 69 05/11/2024 1624   BUN 13 05/11/2024 1624   CREATININE 0.47 (L) 05/11/2024 1624   CALCIUM  9.3 05/11/2024 1624   PROT 6.5 06/11/2024 1045   ALBUMIN 3.9 01/17/2017 1023   AST 13 06/11/2024 1045   ALT 17 06/11/2024 1045   ALKPHOS 87 01/17/2017 1023   BILITOT 0.4 06/11/2024 1045   GFRNONAA >60 01/28/2024 0338   GFRNONAA 95 09/19/2020 1116   GFRAA 110 09/19/2020 1116    Lab Results  Component Value Date   WBC 7.9 01/28/2024   HGB 9.2 (L) 01/28/2024   HCT 30.1 (L) 01/28/2024   MCV 83.8 01/28/2024   PLT 354 01/28/2024   Lab Results  Component Value Date   CHOL 174 06/11/2024   HDL 50 06/11/2024   LDLCALC 105 (H) 06/11/2024   TRIG 101 06/11/2024   CHOLHDL 3.5 06/11/2024   Lab Results  Component Value Date   HGBA1C 6.6 (H) 06/11/2024    Lab Results  Component Value Date   TSH 1.64 11/07/2023   Assessment & Plan:   Orders Placed This Encounter  Procedures   MM 3D SCREENING MAMMOGRAM BILATERAL BREAST    Osteoarthritis, Right Knee S/p Right TKA on 01/27/2024 by Dr. Melodi. She says that she has been stretching her knee twice daily and has noticed definite improvement since her procedure.   Hyperlipidemia treated with Atorvastatin  40 mg at bedtime. 06/11/2024 Lipid Panel: LDL 105, elevated from 104 in 02/2023; otherwise WNL.    Diabetes Mellitus, type II treated with Metformin  500 mg twice daily and Januvia   50 mg daily. 06/11/2024 HgbA1c 6.6,% elevated from 6.4% in January 2025, decreased from 6.8 in November 2024. Try to get exercise, watch diet and follow up in 6 weeks. May need to increase Januvia  to 100 mg daily  GERD- continue PPI  Situational stress continue Effexor  75 mg daily  Mammogram discussed; last completed 2021 was negative. Ordered.   Return in 6 weeks (on 07/28/2024) for lab visit - A1c recheck.    I,Emily Lagle,acting as a Neurosurgeon for Ronal JINNY Hailstone, MD.,have documented all relevant documentation on the behalf of Ronal JINNY Hailstone, MD,as directed by  Ronal JINNY Hailstone, MD while in the presence of Ronal JINNY Hailstone, MD.   I, Ronal JINNY Hailstone, MD, have reviewed all documentation for this visit. The documentation on 06/22/24 for the exam, diagnosis, procedures, and orders are all accurate and complete.

## 2024-06-18 ENCOUNTER — Other Ambulatory Visit: Payer: Self-pay | Admitting: Internal Medicine

## 2024-06-18 DIAGNOSIS — E782 Mixed hyperlipidemia: Secondary | ICD-10-CM

## 2024-06-19 ENCOUNTER — Other Ambulatory Visit: Payer: Self-pay

## 2024-06-19 DIAGNOSIS — E782 Mixed hyperlipidemia: Secondary | ICD-10-CM

## 2024-06-19 MED ORDER — ATORVASTATIN CALCIUM 40 MG PO TABS: 40.0000 mg | ORAL_TABLET | Freq: Every day | ORAL | 3 refills | Status: DC

## 2024-06-22 ENCOUNTER — Encounter: Payer: Self-pay | Admitting: Internal Medicine

## 2024-06-22 NOTE — Patient Instructions (Signed)
 Watch diet and try to get regular exercise. We may need to increase Januvia  if Hgb AIC not improved in 6 weeks.

## 2024-07-28 ENCOUNTER — Other Ambulatory Visit

## 2024-07-30 ENCOUNTER — Other Ambulatory Visit

## 2024-08-21 ENCOUNTER — Telehealth: Payer: Self-pay | Admitting: Internal Medicine

## 2024-08-21 NOTE — Telephone Encounter (Unsigned)
 Copied from CRM [id number]. Topic: Clinical - Medical Advice >> Aug 21, 2024 11:52 AM Tiffini S wrote: Reason for CRM: Patient called saying that a message was suppose to be sent out to [facility] for diabetes medication- please call and follow up

## 2024-08-22 ENCOUNTER — Other Ambulatory Visit: Payer: Self-pay | Admitting: Internal Medicine

## 2024-08-25 ENCOUNTER — Telehealth: Payer: Self-pay | Admitting: Internal Medicine

## 2024-08-25 NOTE — Telephone Encounter (Signed)
 Left message to call back, I am not sure what she is needing?

## 2024-08-25 NOTE — Progress Notes (Signed)
 Office Visit Note  Patient: Donna Spears             Date of Birth: 1956/08/20           MRN: [id number]             PCP: [name], MD Referring: [name], MD Visit Date: 08/26/2024 Occupation: Retired, office work  Subjective:  New Patient (Initial Visit) (Patient states she has shooting pain and weakness in her hands. Patient states she has pain in both of her knees. Patient states her left knee hurts worse than the right knee. )   Discussed the use of AI scribe software for clinical note transcription with the patient, who gave verbal consent to proceed.  History of Present Illness   Donna Spears is a 68 year old female with history of osteoarthritis and rheumatoid arthritis who presents with worsening hand pain and swelling. She was referred for evaluation of arthritis in her hands and abnormal lab test results including positive ANA and elevated sed rate.  She has experienced worsening pain, swelling, and stiffness in her hands over the past year, causing significant discomfort throughout the day and waking her at night. She initially suspected carpal tunnel syndrome due to her history of prolonged typing, but symptoms persisted despite retirement from a job involving extensive keyboard use.  She experiences shooting pains in her hands, particularly in the right hand, which is the most affected. She has not had recent diagnostic testing or treatments for carpal tunnel syndrome, but previously wore wrist braces without relief. Significant swelling in her hands has prevented her from removing rings, and various methods to alleviate this have been unsuccessful.  She reports swelling and pain on the back of her hands. Her fingers are stiff, especially in the mornings. She has not been taking any medication for inflammation, such as ibuprofen or Aleve, and has not used steroids recently.  In addition to her hand symptoms, she has difficulty raising her right arm,  which she attributes to a possible rotator cuff problem. This issue has been present since she stayed in the hospital with her husband in November 2024 and has worsened over time since then.  She underwent a right knee replacement earlier this year and anticipates needing a left knee replacement due to advanced osteoarthritis. She is working on improving the straightness of her right knee post-surgery. She also experiences discomfort in her ankles, describing them as feeling like 'sticks' with feet that 'don't want to work right.'     Labs reviewed ANA 1:80 RF 14 ESR 39  Activities of Daily Living:  Patient reports morning stiffness for 24 hours.   Patient Reports nocturnal pain.  Difficulty dressing/grooming: Denies Difficulty climbing stairs: Reports Difficulty getting out of chair: Reports Difficulty using hands for taps, buttons, cutlery, and/or writing: Reports  Review of Systems  Constitutional:  Positive for fatigue.  HENT:  Positive for mouth dryness. Negative for mouth sores.   Eyes:  Negative for dryness.  Respiratory:  Negative for shortness of breath.   Cardiovascular:  Negative for chest pain and palpitations.  Gastrointestinal:  Negative for blood in stool, constipation and diarrhea.  Endocrine: Negative for increased urination.  Genitourinary:  Negative for involuntary urination.  Musculoskeletal:  Positive for joint pain, gait problem, joint pain, joint swelling, muscle weakness, morning stiffness and muscle tenderness. Negative for myalgias and myalgias.  Skin:  Negative for color change, rash, hair loss and sensitivity to sunlight.  Allergic/Immunologic: Negative  for susceptible to infections.  Neurological:  Negative for dizziness and headaches.  Hematological:  Negative for swollen glands.  Psychiatric/Behavioral:  Negative for depressed mood and sleep disturbance. The patient is not nervous/anxious.     PMFS History:  Patient Active Problem List   Diagnosis  Date Noted   Rheumatoid arthritis with rheumatoid factor of multiple sites without organ or systems involvement (HCC) 08/26/2024   High risk medication use 08/26/2024   Positive ANA (antinuclear antibody) 08/26/2024   OA (osteoarthritis) of knee 01/27/2024   Primary osteoarthritis of right knee 01/27/2024   Back pain 11/17/2021   Pain in left knee 03/05/2018   Obesity, unspecified 10/01/2013   Allergic rhinitis 10/01/2013   GE reflux 08/12/2012   Depression 08/12/2012   Hyperlipidemia, mixed 08/12/2012    Past Medical History:  Diagnosis Date   Allergy    Anemia    Anxiety    Arthritis    Asthma    Depression    Diabetes mellitus without complication (HCC)    GERD (gastroesophageal reflux disease)    Hyperlipidemia     Family History  Problem Relation Age of Onset   Heart disease Mother    Hypertension Mother    Hyperlipidemia Father    Heart disease Brother    Hypertension Brother    Past Surgical History:  Procedure Laterality Date   TOTAL KNEE ARTHROPLASTY Right 01/27/2024   Procedure: TOTAL KNEE ARTHROPLASTY;  Surgeon: Melodi Lerner, MD;  Location: [facility];  Service: Orthopedics;  Laterality: Right;   TRIGGER FINGER RELEASE     Social History   Social History Narrative   Not on file   Immunization History  Administered Date(s) Administered   Hepatitis A, Adult 08/01/2015   Hepatitis B, ADULT 08/01/2015, 09/30/2015   Influenza,inj,Quad PF,6+ Mos 10/01/2013, 10/21/2014, 09/30/2015, 01/17/2017, 10/10/2017, 10/25/2018, 09/18/2019, 09/28/2021   PFIZER(Purple Top)SARS-COV-2 Vaccination 02/25/2020, 03/15/2020   PNEUMOCOCCAL CONJUGATE-20 03/18/2023   Pneumococcal Polysaccharide-23 10/31/2020   Tdap 08/12/2012   Zoster, Live 07/11/2015     Objective: Vital Signs: BP 135/80 (BP Location: Right Arm, Patient Position: Sitting, Cuff Size: Normal)   Pulse 65   Resp 14   Ht 5' 3.5 (1.613 m)   Wt 165 lb 3.2 oz (74.9 kg)   BMI 28.80 kg/m    Physical Exam HENT:      Mouth/Throat:     Mouth: Mucous membranes are moist.     Pharynx: Oropharynx is clear.  Eyes:     Conjunctiva/sclera: Conjunctivae normal.  Cardiovascular:     Rate and Rhythm: Normal rate and regular rhythm.  Pulmonary:     Effort: Pulmonary effort is normal.     Breath sounds: Normal breath sounds.  Musculoskeletal:     Right lower leg: No edema.     Left lower leg: No edema.  Lymphadenopathy:     Cervical: No cervical adenopathy.  Skin:    General: Skin is warm and dry.     Findings: No rash.  Neurological:     Mental Status: She is alert.  Psychiatric:        Mood and Affect: Mood normal.      Musculoskeletal Exam:  Right shoulder limited active abduction, passive ROM intact Elbows full ROM no tenderness or swelling Wrists swelling and tenderness b/l, radiating pain with percussion over carpal tunnel Swollen nodules on dorsum of hands over extensor tendons PIP joint swelling b/l, decreased flexion and extension ROM with pain, tenderness to pressure, no warmth or erythema, also distal heberdon's nodes  Right knee postsurgical change, extension ROM limited by 5-10 degrees, no palpable effusion Ankles full ROM no tenderness or swelling MTPs full ROM no tenderness or swelling   Investigation: No additional findings.  Imaging: XR Hand 2 View Left Result Date: 09/16/2024 X-ray left hand 2 views Radiocarpal and carpal joints appear normal.  MCP joint spaces appear normal.  There is slight narrowing and early marginal osteophytes and multiple subchondral cyst present at PIP and DIP joints.  No erosions or abnormal calcifications seen.  Bone mineralization appears normal. Impression Mild osteoarthritis changes in a distal distribution no erosive disease  XR Hand 2 View Right Result Date: 09/16/2024 X-ray right hand 2 views Radiocarpal and carpal joints appear normal.  MCP joint spaces appear normal.  There is slight narrowing and early marginal osteophytes and multiple  subchondral cyst present at PIP and DIP joints.  No erosions or abnormal calcifications seen.  Bone mineralization appears normal. Impression Mild osteoarthritis changes in a distal distribution no erosive disease   Recent Labs: Lab Results  Component Value Date   WBC 9.5 08/26/2024   HGB 13.3 08/26/2024   PLT 387 08/26/2024   NA 139 08/26/2024   K 4.7 08/26/2024   CL 102 08/26/2024   CO2 25 08/26/2024   GLUCOSE 142 (H) 08/26/2024   BUN 12 08/26/2024   CREATININE 0.47 (L) 08/26/2024   BILITOT 0.5 08/26/2024   ALKPHOS 87 01/17/2017   AST 12 08/26/2024   ALT 13 08/26/2024   PROT 7.5 08/26/2024   ALBUMIN 3.9 01/17/2017   CALCIUM  9.7 08/26/2024   GFRAA 110 09/19/2020    Speciality Comments: No specialty comments available.  Procedures:  No procedures performed Allergies: Azithromycin , Erythromycin, and Other   Assessment / Plan:     Visit Diagnoses: Rheumatoid arthritis with rheumatoid factor of multiple sites without organ or systems involvement (HCC) - Plan: Sedimentation rate, C-reactive protein, Rheumatoid factor, XR Hand 2 View Right, XR Hand 2 View Left Chronic rheumatoid arthritis with significant hand and wrist involvement causing secondary carpal tunnel syndrome. Positive rheumatoid arthritis blood test. Wrist swelling compresses carpal tunnel, worsening symptoms. No anti-inflammatory medication used. - Order hand and wrist x-rays. - Initiate methotrexate  therapy, six pills (15 mg) once a week. - Prescribe folic acid  1 mg daily - Consider short-term low-dose prednisone  for inflammation control taper starting from 20 mg daily  Primary osteoarthritis of left knee - Plan: Anti-DNA antibody, double-stranded, C3 and C4, Sjogrens syndrome-A extractable nuclear antibody, Sjogrens syndrome-B extractable nuclear antibody Advanced osteoarthritis in bilateral knees. Right knee replaced earlier this year. Left knee may require future replacement. Focus on rehabilitation and  improving right knee range of motion.  High risk medication use - Plan: CBC with Differential/Platelet, Comprehensive metabolic panel with GFR, Hepatitis C antibody, Hepatitis B surface antigen, Hepatitis B core antibody, IgM Reviewed risks of medication including cytopenias, hepatotoxicty, GI intolerance, and need for medication monitoring. Does not drink signiticantly. - Checking CBC, CMP, HBV/HCV screening for baseline anticipating methotrexate  start for RA  Right shoulder pain due to rotator cuff tendinopathy and bursitis Chronic right shoulder pain from rotator cuff tendinopathy and bursitis. Full passive range of motion indicates non-arthritic cause.        Orders: Orders Placed This Encounter  Procedures   XR Hand 2 View Right   XR Hand 2 View Left   Sedimentation rate   C-reactive protein   Rheumatoid factor   CBC with Differential/Platelet   Comprehensive metabolic panel with GFR   Hepatitis C antibody  Hepatitis B surface antigen   Hepatitis B core antibody, IgM   Anti-DNA antibody, double-stranded   C3 and C4   Sjogrens syndrome-A extractable nuclear antibody   Sjogrens syndrome-B extractable nuclear antibody   Meds ordered this encounter  Medications   methotrexate  (RHEUMATREX) 2.5 MG tablet    Sig: Take 6 tablets (15 mg total) by mouth once a week. Caution:Chemotherapy. Protect from light.    Dispense:  30 tablet    Refill:  0   folic acid  (FOLVITE ) 1 MG tablet    Sig: Take 1 tablet (1 mg total) by mouth daily.    Dispense:  90 tablet    Refill:  0   predniSONE  (DELTASONE ) 10 MG tablet    Sig: Take 2 tablets (20 mg total) by mouth daily with breakfast for 7 days, THEN 1 tablet (10 mg total) daily with breakfast for 7 days.    Dispense:  21 tablet    Refill:  0     Follow-Up Instructions: Return in about 4 weeks (around 09/23/2024) for New pt RA MTX/GC start f/u 75mo.   Lonni LELON Ester, MD  Note - This record has been created using [employer].   Chart creation errors have been sought, but may not always  have been located. Such creation errors do not reflect on  the standard of medical care.

## 2024-08-26 ENCOUNTER — Ambulatory Visit

## 2024-08-26 ENCOUNTER — Telehealth: Payer: Self-pay | Admitting: Internal Medicine

## 2024-08-26 ENCOUNTER — Ambulatory Visit: Attending: Internal Medicine | Admitting: Internal Medicine

## 2024-08-26 ENCOUNTER — Encounter: Payer: Self-pay | Admitting: Internal Medicine

## 2024-08-26 VITALS — BP 135/80 | HR 65 | Resp 14 | Ht 63.5 in | Wt 165.2 lb

## 2024-08-26 DIAGNOSIS — R768 Other specified abnormal immunological findings in serum: Secondary | ICD-10-CM | POA: Diagnosis not present

## 2024-08-26 DIAGNOSIS — M1712 Unilateral primary osteoarthritis, left knee: Secondary | ICD-10-CM | POA: Diagnosis not present

## 2024-08-26 DIAGNOSIS — M0579 Rheumatoid arthritis with rheumatoid factor of multiple sites without organ or systems involvement: Secondary | ICD-10-CM | POA: Diagnosis not present

## 2024-08-26 DIAGNOSIS — Z79899 Other long term (current) drug therapy: Secondary | ICD-10-CM | POA: Diagnosis not present

## 2024-08-26 NOTE — Telephone Encounter (Signed)
 Pt would like to know what pharmacy Dr. Jeannetta will be sending her medication to and when. Pt stated no one said anything about her new medication in her appointment and is confused.

## 2024-08-27 LAB — CBC WITH DIFFERENTIAL/PLATELET
Absolute Lymphocytes: 1330 {cells}/uL (ref 850–3900)
Absolute Monocytes: 846 {cells}/uL (ref 200–950)
Basophils Absolute: 76 {cells}/uL (ref 0–200)
Basophils Relative: 0.8 %
Eosinophils Absolute: 143 {cells}/uL (ref 15–500)
Eosinophils Relative: 1.5 %
HCT: 41.5 % (ref 35.0–45.0)
Hemoglobin: 13.3 g/dL (ref 11.7–15.5)
MCH: 26.9 pg — ABNORMAL LOW (ref 27.0–33.0)
MCHC: 32 g/dL (ref 32.0–36.0)
MCV: 83.8 fL (ref 80.0–100.0)
MPV: 10.6 fL (ref 7.5–12.5)
Monocytes Relative: 8.9 %
Neutro Abs: 7106 {cells}/uL (ref 1500–7800)
Neutrophils Relative %: 74.8 %
Platelets: 387 Thousand/uL (ref 140–400)
RBC: 4.95 Million/uL (ref 3.80–5.10)
RDW: 13.6 % (ref 11.0–15.0)
Total Lymphocyte: 14 %
WBC: 9.5 Thousand/uL (ref 3.8–10.8)

## 2024-08-27 LAB — C-REACTIVE PROTEIN: CRP: 52.4 mg/L — ABNORMAL HIGH (ref <8.0)

## 2024-08-27 LAB — ANTI-DNA ANTIBODY, DOUBLE-STRANDED: ds DNA Ab: <1 [IU]/mL

## 2024-08-27 LAB — COMPREHENSIVE METABOLIC PANEL WITH GFR
AG Ratio: 1.1 (calc) (ref 1.0–2.5)
ALT: 13 U/L (ref 6–29)
AST: 12 U/L (ref 10–35)
Albumin: 3.9 g/dL (ref 3.6–5.1)
Alkaline phosphatase (APISO): 98 U/L (ref 37–153)
BUN/Creatinine Ratio: 26 (calc) — ABNORMAL HIGH (ref 6–22)
BUN: 12 mg/dL (ref 7–25)
CO2: 25 mmol/L (ref 20–32)
Calcium: 9.7 mg/dL (ref 8.6–10.4)
Chloride: 102 mmol/L (ref 98–110)
Creat: 0.47 mg/dL — ABNORMAL LOW (ref 0.50–1.05)
Globulin: 3.6 g/dL (ref 1.9–3.7)
Glucose, Bld: 142 mg/dL — ABNORMAL HIGH (ref 65–99)
Potassium: 4.7 mmol/L (ref 3.5–5.3)
Sodium: 139 mmol/L (ref 135–146)
Total Bilirubin: 0.5 mg/dL (ref 0.2–1.2)
Total Protein: 7.5 g/dL (ref 6.1–8.1)
eGFR: 103 mL/min/1.73m2 (ref >=60)

## 2024-08-27 LAB — RHEUMATOID FACTOR: Rheumatoid fact SerPl-aCnc: <10 [IU]/mL (ref <14)

## 2024-08-27 LAB — C3 AND C4
C3 Complement: 216 mg/dL — ABNORMAL HIGH (ref 83–193)
C4 Complement: 44 mg/dL (ref 15–57)

## 2024-08-27 LAB — HEPATITIS B SURFACE ANTIGEN: Hepatitis B Surface Ag: NONREACTIVE

## 2024-08-27 LAB — HEPATITIS C ANTIBODY: Hepatitis C Ab: NONREACTIVE

## 2024-08-27 LAB — SJOGRENS SYNDROME-B EXTRACTABLE NUCLEAR ANTIBODY: SSB (La) (ENA) Antibody, IgG: <1 AI

## 2024-08-27 LAB — SEDIMENTATION RATE: Sed Rate: 29 mm/h (ref 0–30)

## 2024-08-27 LAB — SJOGRENS SYNDROME-A EXTRACTABLE NUCLEAR ANTIBODY: SSA (Ro) (ENA) Antibody, IgG: <1 AI

## 2024-08-27 LAB — HEPATITIS B CORE ANTIBODY, IGM: Hep B C IgM: NONREACTIVE

## 2024-08-27 NOTE — Telephone Encounter (Signed)
 Contacted patient to advise I was waiting for her lab results prior to starting methotrexate  to make sure it is safe for her to start, which we did say during the appointment yesterday.   Labs so far show high levels of inflammation with CRP of 52. Her blood count and kidney function tests are normal.  Specifically her hepatitis screening is still in process.    [facility] [address] is the local pharmacy we have on file.     Patient verbalized understanding and confirmed the [facility] [address].

## 2024-08-27 NOTE — Telephone Encounter (Signed)
 I was waiting for her lab results prior to starting methotrexate  to make sure it is safe for her to start, which we did say during the appointment yesterday.  Labs so far show high levels of inflammation with CRP of 52. Her blood count and kidney function tests are normal.  Specifically her hepatitis screening is still in process.   [facility] [address] is the local pharmacy we have on file.

## 2024-08-28 ENCOUNTER — Ambulatory Visit: Payer: Self-pay | Admitting: Internal Medicine

## 2024-08-28 MED ORDER — METHOTREXATE SODIUM 2.5 MG PO TABS: 15.0000 mg | ORAL_TABLET | ORAL | 0 refills | Status: DC

## 2024-08-28 MED ORDER — PREDNISONE 10 MG PO TABS: ORAL_TABLET | ORAL | 0 refills | Status: AC

## 2024-08-28 MED ORDER — FOLIC ACID 1 MG PO TABS: 1.0000 mg | ORAL_TABLET | Freq: Every day | ORAL | 0 refills | Status: DC

## 2024-08-28 NOTE — Progress Notes (Signed)
 The rest of her lab results are back I see no problem for starting methotrexate  as planned.  I sent the new prescription for this her dose to be 6 tablets taken once weekly. I also sent a prescription for folic acid  once daily in for a 2-week course of the prednisone  like we discussed to get her symptoms improved more quickly.

## 2024-09-21 ENCOUNTER — Other Ambulatory Visit: Payer: Self-pay | Admitting: Internal Medicine

## 2024-09-21 DIAGNOSIS — M0579 Rheumatoid arthritis with rheumatoid factor of multiple sites without organ or systems involvement: Secondary | ICD-10-CM

## 2024-09-21 NOTE — Telephone Encounter (Signed)
 Last Fill: 08/28/2024  Labs: 08/26/2024 Glucose 142 Creat 0.47 BUN 26 MCH 26.9   Next Visit: 10/26/2024  Last Visit: 08/26/2024  DX: Rheumatoid arthritis with rheumatoid factor of multiple sites without organ or systems involvement   Current Dose per office note 08/26/2024: methotrexate  therapy, six pills (15 mg) once a week.   Okay to refill Methotrexate ?

## 2024-09-27 NOTE — Progress Notes (Signed)
 Office Visit Note  Patient: Donna Spears             Date of Birth: 1956-08-10           MRN: [id number]             PCP: [name], MD Referring: [name], MD Visit Date: 09/28/2024   Subjective:   Discussed the use of AI scribe software for clinical note transcription with the patient, who gave verbal consent to proceed.  History of Present Illness   Donna Spears is a 68 year old female with rheumatoid arthritis who presents for follow-up of her condition and treatment response after starting methotrexate  15 mg PO weekly and short term prednisone  taper.  She had significant swelling and high inflammation markers, with a CRP of 54. Prednisone  was also prescribed for two weeks to help control inflammation while waiting for the methotrexate  to take full effect.  Since starting methotrexate , she has not noticed much improvement in her symptoms. However, there is some improvement in the numbness and shooting pain in her hands. She experiences morning stiffness in her hands, and her fingers do not function as expected.  She reports difficulty sleeping. She also mentions experiencing diarrhea, which she suspects may be related to methotrexate , as no one else in her household has similar symptoms.  She recently received a cortisone injection in her knee two weeks ago, which provided immediate relief.      Previous HPI 08/26/24 Donna Spears is a 68 year old female with history of osteoarthritis and rheumatoid arthritis who presents with worsening hand pain and swelling. She was referred for evaluation of arthritis in her hands and abnormal lab test results including positive ANA and elevated sed rate.   She has experienced worsening pain, swelling, and stiffness in her hands over the past year, causing significant discomfort throughout the day and waking her at night. She initially suspected carpal tunnel syndrome due to her history of prolonged typing, but  symptoms persisted despite retirement from a job involving extensive keyboard use.   She experiences shooting pains in her hands, particularly in the right hand, which is the most affected. She has not had recent diagnostic testing or treatments for carpal tunnel syndrome, but previously wore wrist braces without relief. Significant swelling in her hands has prevented her from removing rings, and various methods to alleviate this have been unsuccessful.   She reports swelling and pain on the back of her hands. Her fingers are stiff, especially in the mornings. She has not been taking any medication for inflammation, such as ibuprofen or Aleve, and has not used steroids recently.   In addition to her hand symptoms, she has difficulty raising her right arm, which she attributes to a possible rotator cuff problem. This issue has been present since she stayed in the hospital with her husband in November 2024 and has worsened over time since then.   She underwent a right knee replacement earlier this year and anticipates needing a left knee replacement due to advanced osteoarthritis. She is working on improving the straightness of her right knee post-surgery. She also experiences discomfort in her ankles, describing them as feeling like 'sticks' with feet that 'don't want to work right.'     Labs reviewed ANA 1:80 RF 14 ESR 39   Review of Systems  Constitutional:  Negative for fatigue.  HENT:  Negative for mouth sores and mouth dryness.   Eyes:  Negative for dryness.  Respiratory:  Negative for shortness of breath.   Cardiovascular:  Negative for chest pain and palpitations.  Gastrointestinal:  Positive for constipation. Negative for blood in stool and diarrhea.  Endocrine: Negative for increased urination.  Genitourinary:  Negative for involuntary urination.  Musculoskeletal:  Positive for joint swelling and morning stiffness. Negative for joint pain, gait problem, joint pain, myalgias, muscle  weakness, muscle tenderness and myalgias.  Skin:  Negative for color change, rash, hair loss and sensitivity to sunlight.  Allergic/Immunologic: Negative for susceptible to infections.  Neurological:  Negative for dizziness and headaches.  Hematological:  Negative for swollen glands.  Psychiatric/Behavioral:  Negative for depressed mood and sleep disturbance. The patient is not nervous/anxious.     PMFS History:  Patient Active Problem List   Diagnosis Date Noted   Rheumatoid arthritis with rheumatoid factor of multiple sites without organ or systems involvement (HCC) 08/26/2024   High risk medication use 08/26/2024   Positive ANA (antinuclear antibody) 08/26/2024   OA (osteoarthritis) of knee 01/27/2024   Primary osteoarthritis of right knee 01/27/2024   Back pain 11/17/2021   Pain in left knee 03/05/2018   Obesity, unspecified 10/01/2013   Allergic rhinitis 10/01/2013   GE reflux 08/12/2012   Depression 08/12/2012   Hyperlipidemia, mixed 08/12/2012    Past Medical History:  Diagnosis Date   Allergy    Anemia    Anxiety    Arthritis    Asthma    Depression    Diabetes mellitus without complication (HCC)    GERD (gastroesophageal reflux disease)    Hyperlipidemia     Family History  Problem Relation Age of Onset   Heart disease Mother    Hypertension Mother    Hyperlipidemia Father    Heart disease Brother    Hypertension Brother    Past Surgical History:  Procedure Laterality Date   TOTAL KNEE ARTHROPLASTY Right 01/27/2024   Procedure: TOTAL KNEE ARTHROPLASTY;  Surgeon: Melodi Lerner, MD;  Location: [facility];  Service: Orthopedics;  Laterality: Right;   TRIGGER FINGER RELEASE     Social History   Social History Narrative   Not on file   Immunization History  Administered Date(s) Administered   Hepatitis A, Adult 08/01/2015   Hepatitis B, ADULT 08/01/2015, 09/30/2015   Influenza,inj,Quad PF,6+ Mos 10/01/2013, 10/21/2014, 09/30/2015, 01/17/2017, 10/10/2017,  10/25/2018, 09/18/2019, 09/28/2021   PFIZER(Purple Top)SARS-COV-2 Vaccination 02/25/2020, 03/15/2020   PNEUMOCOCCAL CONJUGATE-20 03/18/2023   Pneumococcal Polysaccharide-23 10/31/2020   Tdap 08/12/2012   Zoster, Live 07/11/2015     Objective: Vital Signs: BP (!) 147/78   Pulse 77   Temp (!) 97.4 F (36.3 C)   Resp 16   Ht 5' 3.5 (1.613 m)   Wt 169 lb 9.6 oz (76.9 kg)   BMI 29.57 kg/m    Physical Exam Eyes:     Conjunctiva/sclera: Conjunctivae normal.  Cardiovascular:     Rate and Rhythm: Normal rate and regular rhythm.  Pulmonary:     Effort: Pulmonary effort is normal.     Breath sounds: Normal breath sounds.  Skin:    General: Skin is warm and dry.  Neurological:     Mental Status: She is alert.  Psychiatric:        Mood and Affect: Mood normal.      Musculoskeletal Exam:  Right shoulder limited active abduction, passive ROM intact Elbows full ROM no tenderness or swelling Wrists swelling and tenderness b/l, radiating pain with percussion over carpal tunnel Swollen nodules on dorsum of hands over extensor  tendons, left wrist tenderness worse than right, radiation of pain to percussion on flexor surface PIP joint swelling b/l, decreased flexion and extension ROM with pain, tenderness to pressure, no warmth or erythema, also distal heberdon's nodes Right knee postsurgical change, extension ROM limited by 5-10 degrees, no palpable effusion Ankles full ROM no tenderness or swelling MTPs full ROM no tenderness or swelling   Investigation: No additional findings.  Imaging: No results found.  Recent Labs: Lab Results  Component Value Date   WBC 6.4 09/28/2024   HGB 12.5 09/28/2024   PLT 197 09/28/2024   NA 140 09/28/2024   K 4.2 09/28/2024   CL 106 09/28/2024   CO2 25 09/28/2024   GLUCOSE 133 (H) 09/28/2024   BUN 12 09/28/2024   CREATININE 0.45 (L) 09/28/2024   BILITOT 0.5 09/28/2024   ALKPHOS 87 01/17/2017   AST 15 09/28/2024   ALT 21 09/28/2024    PROT 6.6 09/28/2024   ALBUMIN 3.9 01/17/2017   CALCIUM  9.1 09/28/2024   GFRAA 110 09/19/2020    Speciality Comments: No specialty comments available.  Procedures:  No procedures performed Allergies: Azithromycin , Erythromycin, and Other   Assessment / Plan:     Visit Diagnoses: Rheumatoid arthritis with rheumatoid factor of multiple sites without organ or systems involvement (HCC) - Plan: C-reactive protein, methotrexate  (RHEUMATREX) 2.5 MG tablet High inflammation with CRP of 54. Methotrexate  shows limited improvement.  Discussed her total treatment has not been very long and there is room for dose titration.  If we do not see additional progress by her next follow-up could be a good candidate for biologic therapy but her preference to avoid injections unless needed. - Continue methotrexate , increasing dose to 8 tablets from 6 weekly. - Prescribe prednisone  short-term to control inflammation if remains elevated. - Recheck CRP, blood count, kidney, and liver enzymes.  High risk medication use - Plan: CBC with Differential/Platelet, Comprehensive metabolic panel with GFR Diarrhea possibly due to methotrexate  Diarrhea is a potential methotrexate  side effect. Increasing dose may exacerbate symptoms.  No significant interval infections. - Monitor for diarrhea, especially with increased methotrexate  dose. - If diarrhea occurs, split methotrexate  dose into two doses on the same day. - Discuss methotrexate  injection to bypass gastrointestinal side effects, but she prefers to avoid injections.  - Checking CBC and CMP for medication monitoring after methotrexate  start  Secondary carpal tunnel syndrome Carpal tunnel symptoms secondary to wrist swelling.  Swelling is still apparent on exam both joint synovitis and tenosynovitis prominent nodules on extensor surface and presumably flexor compartment involvement as well.  Discussed targeted treatment with ultrasound-guided steroid injections but I  would also anticipate her to see improvement getting the systemic RA disease under better control. - Consider steroid injection in wrist if carpal tunnel symptoms persist.       Orders: Orders Placed This Encounter  Procedures   C-reactive protein   CBC with Differential/Platelet   Comprehensive metabolic panel with GFR   Meds ordered this encounter  Medications   methotrexate  (RHEUMATREX) 2.5 MG tablet    Sig: Take 8 tablets (20 mg total) by mouth once a week. Caution:Chemotherapy. Protect from light.    Dispense:  96 tablet    Refill:  0     Follow-Up Instructions: Return in about 2 months (around 11/28/2024) for RA MTX increase f/u 2mos.   Lonni LELON Ester, MD  Note - This record has been created using [employer].  Chart creation errors have been sought, but may not always  have been located. Such creation errors do not reflect on  the standard of medical care.

## 2024-09-28 ENCOUNTER — Ambulatory Visit: Attending: Internal Medicine | Admitting: Internal Medicine

## 2024-09-28 ENCOUNTER — Encounter: Payer: Self-pay | Admitting: Internal Medicine

## 2024-09-28 VITALS — BP 147/78 | HR 77 | Temp 97.4°F | Resp 16 | Ht 63.5 in | Wt 169.6 lb

## 2024-09-28 DIAGNOSIS — M0579 Rheumatoid arthritis with rheumatoid factor of multiple sites without organ or systems involvement: Secondary | ICD-10-CM

## 2024-09-28 DIAGNOSIS — Z79899 Other long term (current) drug therapy: Secondary | ICD-10-CM

## 2024-09-28 MED ORDER — METHOTREXATE SODIUM 2.5 MG PO TABS: 20.0000 mg | ORAL_TABLET | ORAL | 0 refills | Status: DC

## 2024-09-29 LAB — COMPREHENSIVE METABOLIC PANEL WITH GFR
AG Ratio: 1.4 (calc) (ref 1.0–2.5)
ALT: 21 U/L (ref 6–29)
AST: 15 U/L (ref 10–35)
Albumin: 3.9 g/dL (ref 3.6–5.1)
Alkaline phosphatase (APISO): 108 U/L (ref 37–153)
BUN/Creatinine Ratio: 27 (calc) — ABNORMAL HIGH (ref 6–22)
BUN: 12 mg/dL (ref 7–25)
CO2: 25 mmol/L (ref 20–32)
Calcium: 9.1 mg/dL (ref 8.6–10.4)
Chloride: 106 mmol/L (ref 98–110)
Creat: 0.45 mg/dL — ABNORMAL LOW (ref 0.50–1.05)
Globulin: 2.7 g/dL (ref 1.9–3.7)
Glucose, Bld: 133 mg/dL — ABNORMAL HIGH (ref 65–99)
Potassium: 4.2 mmol/L (ref 3.5–5.3)
Sodium: 140 mmol/L (ref 135–146)
Total Bilirubin: 0.5 mg/dL (ref 0.2–1.2)
Total Protein: 6.6 g/dL (ref 6.1–8.1)
eGFR: 104 mL/min/1.73m2 (ref >=60)

## 2024-09-29 LAB — CBC WITH DIFFERENTIAL/PLATELET
Absolute Lymphocytes: 928 {cells}/uL (ref 850–3900)
Absolute Monocytes: 678 {cells}/uL (ref 200–950)
Basophils Absolute: 51 {cells}/uL (ref 0–200)
Basophils Relative: 0.8 %
Eosinophils Absolute: 70 {cells}/uL (ref 15–500)
Eosinophils Relative: 1.1 %
HCT: 39.5 % (ref 35.0–45.0)
Hemoglobin: 12.5 g/dL (ref 11.7–15.5)
MCH: 26.8 pg — ABNORMAL LOW (ref 27.0–33.0)
MCHC: 31.6 g/dL — ABNORMAL LOW (ref 32.0–36.0)
MCV: 84.6 fL (ref 80.0–100.0)
MPV: 11.7 fL (ref 7.5–12.5)
Monocytes Relative: 10.6 %
Neutro Abs: 4672 {cells}/uL (ref 1500–7800)
Neutrophils Relative %: 73 %
Platelets: 197 Thousand/uL (ref 140–400)
RBC: 4.67 Million/uL (ref 3.80–5.10)
RDW: 15 % (ref 11.0–15.0)
Total Lymphocyte: 14.5 %
WBC: 6.4 Thousand/uL (ref 3.8–10.8)

## 2024-09-29 LAB — C-REACTIVE PROTEIN: CRP: 11.6 mg/L — ABNORMAL HIGH (ref <8.0)

## 2024-10-26 ENCOUNTER — Encounter: Admitting: Internal Medicine

## 2024-10-26 ENCOUNTER — Other Ambulatory Visit: Payer: Self-pay | Admitting: *Deleted

## 2024-10-26 ENCOUNTER — Other Ambulatory Visit: Payer: Self-pay | Admitting: Internal Medicine

## 2024-10-26 DIAGNOSIS — M0579 Rheumatoid arthritis with rheumatoid factor of multiple sites without organ or systems involvement: Secondary | ICD-10-CM

## 2024-10-26 MED ORDER — METHOTREXATE SODIUM 2.5 MG PO TABS: 20.0000 mg | ORAL_TABLET | ORAL | 0 refills | Status: DC

## 2024-10-26 NOTE — Telephone Encounter (Signed)
 Patient states she is unable to locate her MTX. Patient states she is due to be leaving town. Patient is asking for a 30 day supply to be sent to her local pharmacy.   Last Fill: 09/28/2024  Labs: 09/28/2024 Glucose 133, Creat. 0.45, BUN/Creat. Ratio 27, MCH 26.8, MCHC 31.6  Next Visit: 12/03/2024  Last Visit: 09/28/2024  DX:  Rheumatoid arthritis with rheumatoid factor of multiple sites without organ or systems involvement   Current Dose per office note 09/28/2024:  Continue methotrexate , increasing dose to 8 tablets from 6 weekly.   Okay to refill Methotrexate ?

## 2024-11-16 ENCOUNTER — Other Ambulatory Visit: Payer: Self-pay | Admitting: Internal Medicine

## 2024-11-17 ENCOUNTER — Other Ambulatory Visit: Payer: Self-pay | Admitting: Internal Medicine

## 2024-11-17 DIAGNOSIS — M0579 Rheumatoid arthritis with rheumatoid factor of multiple sites without organ or systems involvement: Secondary | ICD-10-CM

## 2024-11-19 ENCOUNTER — Other Ambulatory Visit: Payer: Self-pay | Admitting: Internal Medicine

## 2024-11-19 DIAGNOSIS — M0579 Rheumatoid arthritis with rheumatoid factor of multiple sites without organ or systems involvement: Secondary | ICD-10-CM

## 2024-11-23 ENCOUNTER — Ambulatory Visit: Payer: Self-pay | Admitting: Internal Medicine

## 2024-11-23 NOTE — Telephone Encounter (Signed)
 Last Fill: 10/26/2024 (30 day supply)  Labs: 09/28/2024 Glucose 133, Creat. 0.45, BUN/Creat. Ratio 27, MCH 26.8, MCHC 31.6  Next Visit: 12/03/2024  Last Visit: 09/28/2024  DX: Rheumatoid arthritis with rheumatoid factor of multiple sites without organ or systems involvement   Current Dose per office note 09/28/2024: Continue methotrexate , increasing dose to 8 tablets from 6 weekly.   Okay to refill Methotrexate ?

## 2024-11-27 NOTE — Progress Notes (Signed)
 "  Office Visit Note  Patient: Donna Spears             Date of Birth: [birth year]           MRN: [id number]             PCP: [name], MD Referring: [name], MD Visit Date: 12/03/2024   Subjective:  History of Present Illness:  Discussed the use of AI scribe software for clinical note transcription with the patient, who gave verbal consent to proceed.  History of Present Illness   Donna Spears is a 68 y.o. female here for follow up with rheumatoid arthritis who presents for follow-up of her condition and treatment response after increasing methotrexate  to 20 mg PO weekly.  She has ongoing joint swelling, particularly in her knees and hands. The swelling in her knees persists, with one knee being worse than the other. She underwent surgery on one knee in February, and while flexion is good, she is missing about 8 degrees of extension. She is scheduled for surgery on the other knee in February.  She has been experiencing carpal tunnel syndrome, which has improved since the last visit. Her hands are no longer 'falling asleep' as they were before.  She is currently taking methotrexate , having increased the dose. She takes the medication all at once and does not experience any adverse effects. She has not needed to take steroids recently, with the last use being for a sinus infection last year.  She describes aching in her feet, particularly noticeable when lying down at night. This does not occur every night and does not require her to get up and move around.  Previous HPI 09/28/2024 Donna Spears is a 68 year old female with rheumatoid arthritis who presents for follow-up of her condition and treatment response after starting methotrexate  15 mg PO weekly and short term prednisone  taper.   She had significant swelling and high inflammation markers, with a CRP of 54. Prednisone  was also prescribed for two weeks to help control inflammation while waiting for the  methotrexate  to take full effect.   Since starting methotrexate , she has not noticed much improvement in her symptoms. However, there is some improvement in the numbness and shooting pain in her hands. She experiences morning stiffness in her hands, and her fingers do not function as expected.   She reports difficulty sleeping. She also mentions experiencing diarrhea, which she suspects may be related to methotrexate , as no one else in her household has similar symptoms.   She recently received a cortisone injection in her knee two weeks ago, which provided immediate relief.       Previous HPI 08/26/24 Donna Spears is a 68 year old female with history of osteoarthritis and rheumatoid arthritis who presents with worsening hand pain and swelling. She was referred for evaluation of arthritis in her hands and abnormal lab test results including positive ANA and elevated sed rate.   She has experienced worsening pain, swelling, and stiffness in her hands over the past year, causing significant discomfort throughout the day and waking her at night. She initially suspected carpal tunnel syndrome due to her history of prolonged typing, but symptoms persisted despite retirement from a job involving extensive keyboard use.   She experiences shooting pains in her hands, particularly in the right hand, which is the most affected. She has not had recent diagnostic testing or treatments for carpal tunnel syndrome, but previously wore wrist braces without  relief. Significant swelling in her hands has prevented her from removing rings, and various methods to alleviate this have been unsuccessful.   She reports swelling and pain on the back of her hands. Her fingers are stiff, especially in the mornings. She has not been taking any medication for inflammation, such as ibuprofen or Aleve, and has not used steroids recently.   In addition to her hand symptoms, she has difficulty raising her right arm, which she  attributes to a possible rotator cuff problem. This issue has been present since she stayed in the hospital with her husband in November 2024 and has worsened over time since then.   She underwent a right knee replacement earlier this year and anticipates needing a left knee replacement due to advanced osteoarthritis. She is working on improving the straightness of her right knee post-surgery. She also experiences discomfort in her ankles, describing them as feeling like 'sticks' with feet that 'don't want to work right.'     Labs reviewed ANA 1:80 RF 14 ESR 39   Review of Systems  Constitutional:  Negative for fatigue.  HENT:  Positive for mouth dryness. Negative for mouth sores.   Eyes:  Negative for dryness.  Respiratory:  Negative for shortness of breath.   Cardiovascular:  Negative for chest pain and palpitations.  Gastrointestinal:  Negative for blood in stool, constipation and diarrhea.  Endocrine: Negative for increased urination.  Genitourinary:  Negative for involuntary urination.  Musculoskeletal:  Positive for joint pain, joint pain and morning stiffness. Negative for gait problem, joint swelling, myalgias, muscle weakness, muscle tenderness and myalgias.  Skin:  Negative for color change, rash, hair loss and sensitivity to sunlight.  Allergic/Immunologic: Negative for susceptible to infections.  Neurological:  Negative for dizziness and headaches.  Hematological:  Negative for swollen glands.  Psychiatric/Behavioral:  Negative for depressed mood and sleep disturbance. The patient is not nervous/anxious.     PMFS History:  Patient Active Problem List   Diagnosis Date Noted   Normocytic anemia 12/03/2024   Rheumatoid arthritis with rheumatoid factor of multiple sites without organ or systems involvement (HCC) 08/26/2024   High risk medication use 08/26/2024   Positive ANA (antinuclear antibody) 08/26/2024   OA (osteoarthritis) of knee 01/27/2024   Primary osteoarthritis  of right knee 01/27/2024   Back pain 11/17/2021   Pain in left knee 03/05/2018   Obesity, unspecified 10/01/2013   Allergic rhinitis 10/01/2013   GE reflux 08/12/2012   Depression 08/12/2012   Hyperlipidemia, mixed 08/12/2012    Past Medical History:  Diagnosis Date   Allergy    Anemia    Anxiety    Arthritis    Asthma    Depression    Diabetes mellitus without complication (HCC)    GERD (gastroesophageal reflux disease)    Hyperlipidemia     Family History  Problem Relation Age of Onset   Heart disease Mother    Hypertension Mother    Hyperlipidemia Father    Heart disease Brother    Hypertension Brother    Past Surgical History:  Procedure Laterality Date   TOTAL KNEE ARTHROPLASTY Right 01/27/2024   Procedure: TOTAL KNEE ARTHROPLASTY;  Surgeon: Melodi Lerner, MD;  Location: [facility];  Service: Orthopedics;  Laterality: Right;   TRIGGER FINGER RELEASE     Social History   Social History Narrative   Not on file   Immunization History  Administered Date(s) Administered   Hepatitis A, Adult 08/01/2015   Hepatitis B, ADULT 08/01/2015, 09/30/2015  Influenza,inj,Quad PF,6+ Mos 10/01/2013, 10/21/2014, 09/30/2015, 01/17/2017, 10/10/2017, 10/25/2018, 09/18/2019, 09/28/2021   PFIZER(Purple Top)SARS-COV-2 Vaccination 02/25/2020, 03/15/2020   PNEUMOCOCCAL CONJUGATE-20 03/18/2023   Pneumococcal Polysaccharide-23 10/31/2020   Tdap 08/12/2012   Zoster, Live 07/11/2015     Objective: Vital Signs: BP 135/66   Pulse 73   Temp (!) 97.5 F (36.4 C)   Resp 16   Ht 5' 3.5 (1.613 m)   Wt 177 lb (80.3 kg)   BMI 30.86 kg/m    Physical Exam Eyes:     Conjunctiva/sclera: Conjunctivae normal.  Cardiovascular:     Rate and Rhythm: Normal rate and regular rhythm.  Pulmonary:     Effort: Pulmonary effort is normal.     Breath sounds: Normal breath sounds.  Lymphadenopathy:     Cervical: No cervical adenopathy.  Skin:    General: Skin is warm and dry.  Neurological:      Mental Status: She is alert.  Psychiatric:        Mood and Affect: Mood normal.      Musculoskeletal Exam:  Right shoulder limited active abduction, passive ROM intact Elbows full ROM no tenderness or swelling Wrists swelling and tenderness b/l, radiating pain with percussion over carpal tunnel MCP tenderness to pressure without palpable effusions,  radiation of pain to percussion on flexor surface PIP joint swelling b/l, decreased flexion and extension ROM with pain, tenderness to pressure, no warmth or erythema, also distal heberdon's nodes Right knee postsurgical change, extension ROM limited by 5-10 degrees, no palpable effusion Ankles full ROM no tenderness or swelling      Investigation: No additional findings.  Imaging: No results found.  Recent Labs: Lab Results  Component Value Date   WBC 5.9 12/03/2024   HGB 13.9 12/03/2024   PLT 270 12/03/2024   NA 142 12/03/2024   K 4.2 12/03/2024   CL 106 12/03/2024   CO2 26 12/03/2024   GLUCOSE 107 (H) 12/03/2024   BUN 18 12/03/2024   CREATININE 0.55 12/03/2024   BILITOT 0.5 12/03/2024   ALKPHOS 87 01/17/2017   AST 20 12/03/2024   ALT 31 (H) 12/03/2024   PROT 6.7 12/03/2024   ALBUMIN 3.9 01/17/2017   CALCIUM  9.5 12/03/2024   GFRAA 110 09/19/2020    Speciality Comments: No specialty comments available.  Procedures:  No procedures performed Allergies: Azithromycin , Erythromycin, and Other   Assessment / Plan:     Visit Diagnoses: Rheumatoid arthritis with rheumatoid factor of multiple sites without organ or systems involvement (HCC) - Plan: C-reactive protein, methotrexate  (RHEUMATREX) 2.5 MG tablet, folic acid  (FOLVITE ) 1 MG tablet Persistent joint swelling and pain, improved but not in remission. Carpal tunnel symptoms improved. Knee extension issues post-surgery. New nocturnal foot pain, likely unrelated to arthritis. She is scheduled for February surgery with D.r Aluisio. - Continue methotrexate  20 mg PO weekly  and folic acid  1 mg daily - Monitor blood work for methotrexate  effects and assess vitamin B12 levels for foot pain. - Follow-up at least one month postoperatively.  High risk medication use - Plan: CBC with Differential/Platelet, Comprehensive metabolic panel with GFR Methotrexate  requires monitoring for side effects. No serious interval infections. - Checking CBC and CMP for medication monitoring on methotrexate   Bilateral foot pain - Plan: Iron, TIBC and Ferritin Panel, B12 and Folate Panel Normocytic anemia - Plan: Iron, TIBC and Ferritin Panel   Orders: Orders Placed This Encounter  Procedures   CBC with Differential/Platelet   Comprehensive metabolic panel with GFR   C-reactive protein  Iron, TIBC and Ferritin Panel   B12 and Folate Panel   Meds ordered this encounter  Medications   methotrexate  (RHEUMATREX) 2.5 MG tablet    Sig: Take 8 tablets (20 mg total) by mouth once a week. Caution:Chemotherapy. Protect from light.    Dispense:  96 tablet    Refill:  1   folic acid  (FOLVITE ) 1 MG tablet    Sig: Take 1 tablet (1 mg total) by mouth daily.    Dispense:  90 tablet    Refill:  3     Follow-Up Instructions: Return in about 4 months (around 04/03/2025) for RA on MTX f/u 4mos.   Lonni LELON Ester, MD  Note - This record has been created using [employer].  Chart creation errors have been sought, but may not always  have been located. Such creation errors do not reflect on  the standard of medical care. "

## 2024-12-03 ENCOUNTER — Ambulatory Visit: Attending: Internal Medicine | Admitting: Internal Medicine

## 2024-12-03 ENCOUNTER — Encounter: Payer: Self-pay | Admitting: Internal Medicine

## 2024-12-03 VITALS — BP 135/66 | HR 73 | Temp 97.5°F | Resp 16 | Ht 63.5 in | Wt 177.0 lb

## 2024-12-03 DIAGNOSIS — M170 Bilateral primary osteoarthritis of knee: Secondary | ICD-10-CM

## 2024-12-03 DIAGNOSIS — M79671 Pain in right foot: Secondary | ICD-10-CM | POA: Diagnosis not present

## 2024-12-03 DIAGNOSIS — M0579 Rheumatoid arthritis with rheumatoid factor of multiple sites without organ or systems involvement: Secondary | ICD-10-CM

## 2024-12-03 DIAGNOSIS — D649 Anemia, unspecified: Secondary | ICD-10-CM | POA: Insufficient documentation

## 2024-12-03 DIAGNOSIS — M79672 Pain in left foot: Secondary | ICD-10-CM

## 2024-12-03 DIAGNOSIS — Z79899 Other long term (current) drug therapy: Secondary | ICD-10-CM

## 2024-12-03 MED ORDER — FOLIC ACID 1 MG PO TABS: 1.0000 mg | ORAL_TABLET | Freq: Every day | ORAL | 3 refills | Status: AC

## 2024-12-03 MED ORDER — METHOTREXATE SODIUM 2.5 MG PO TABS: 20.0000 mg | ORAL_TABLET | ORAL | 1 refills | Status: AC

## 2024-12-04 LAB — CBC WITH DIFFERENTIAL/PLATELET
Absolute Lymphocytes: 1068 {cells}/uL (ref 850–3900)
Absolute Monocytes: 696 {cells}/uL (ref 200–950)
Basophils Absolute: 47 {cells}/uL (ref 0–200)
Basophils Relative: 0.8 %
Eosinophils Absolute: 89 {cells}/uL (ref 15–500)
Eosinophils Relative: 1.5 %
HCT: 42.6 % (ref 35.9–46.0)
Hemoglobin: 13.9 g/dL (ref 11.7–15.5)
MCH: 28.8 pg (ref 27.0–33.0)
MCHC: 32.6 g/dL (ref 31.6–35.4)
MCV: 88.4 fL (ref 81.4–101.7)
MPV: 10.8 fL (ref 7.5–12.5)
Monocytes Relative: 11.8 %
Neutro Abs: 4000 {cells}/uL (ref 1500–7800)
Neutrophils Relative %: 67.8 %
Platelets: 270 Thousand/uL (ref 140–400)
RBC: 4.82 Million/uL (ref 3.80–5.10)
RDW: 16.7 % — ABNORMAL HIGH (ref 11.0–15.0)
Total Lymphocyte: 18.1 %
WBC: 5.9 Thousand/uL (ref 3.8–10.8)

## 2024-12-04 LAB — COMPREHENSIVE METABOLIC PANEL WITH GFR
AG Ratio: 1.6 (calc) (ref 1.0–2.5)
ALT: 31 U/L — ABNORMAL HIGH (ref 6–29)
AST: 20 U/L (ref 10–35)
Albumin: 4.1 g/dL (ref 3.6–5.1)
Alkaline phosphatase (APISO): 106 U/L (ref 37–153)
BUN: 18 mg/dL (ref 7–25)
CO2: 26 mmol/L (ref 20–32)
Calcium: 9.5 mg/dL (ref 8.6–10.4)
Chloride: 106 mmol/L (ref 98–110)
Creat: 0.55 mg/dL (ref 0.50–1.05)
Globulin: 2.6 g/dL (ref 1.9–3.7)
Glucose, Bld: 107 mg/dL — ABNORMAL HIGH (ref 65–99)
Potassium: 4.2 mmol/L (ref 3.5–5.3)
Sodium: 142 mmol/L (ref 135–146)
Total Bilirubin: 0.5 mg/dL (ref 0.2–1.2)
Total Protein: 6.7 g/dL (ref 6.1–8.1)
eGFR: 99 mL/min/1.73m2 (ref >=60)

## 2024-12-04 LAB — IRON,TIBC AND FERRITIN PANEL
%SAT: 34 % (ref 16–45)
Ferritin: 65 ng/mL (ref 16–288)
Iron: 123 ug/dL (ref 45–160)
TIBC: 360 ug/dL (ref 250–450)

## 2024-12-04 LAB — B12 AND FOLATE PANEL
Folate: >24 ng/mL
Vitamin B-12: 640 pg/mL (ref 200–1100)

## 2024-12-04 LAB — C-REACTIVE PROTEIN: CRP: 5 mg/L (ref <8.0)

## 2024-12-07 NOTE — Telephone Encounter (Signed)
 done

## 2024-12-12 ENCOUNTER — Other Ambulatory Visit: Payer: Self-pay | Admitting: Internal Medicine

## 2024-12-12 DIAGNOSIS — M0579 Rheumatoid arthritis with rheumatoid factor of multiple sites without organ or systems involvement: Secondary | ICD-10-CM

## 2025-01-07 ENCOUNTER — Telehealth: Payer: Self-pay

## 2025-01-07 NOTE — Telephone Encounter (Signed)
 Copied from CRM [id number]. Topic: Clinical - Medical Advice >> Jan 07, 2025  3:15 PM Terri MATSU wrote: Reason for CRM: Patient is having knee surgery 2/16 and wanted to know if she needed clearance. Callback number  [phone]

## 2025-01-08 ENCOUNTER — Telehealth: Payer: Self-pay | Admitting: Internal Medicine

## 2025-01-12 ENCOUNTER — Telehealth: Payer: Self-pay | Admitting: Internal Medicine

## 2025-01-13 NOTE — Telephone Encounter (Signed)
 Done

## 2025-01-14 NOTE — Progress Notes (Signed)
 Cairo Lingenfelter                                          MRN: [id number]   01/14/2025   The [facility] Quality Team Specialist reviewed this patient medical record for the purposes of chart review for care gap closure. The following were reviewed: chart review for care gap closure-breast cancer screening.    [facility] Quality Team

## 2025-01-22 ENCOUNTER — Ambulatory Visit: Payer: Self-pay | Admitting: Internal Medicine

## 2025-01-22 ENCOUNTER — Ambulatory Visit
Admission: RE | Admit: 2025-01-22 | Discharge: 2025-01-22 | Disposition: A | Source: Ambulatory Visit | Attending: Internal Medicine | Admitting: Internal Medicine

## 2025-01-22 ENCOUNTER — Ambulatory Visit: Admitting: Internal Medicine

## 2025-01-22 ENCOUNTER — Encounter: Payer: Self-pay | Admitting: Internal Medicine

## 2025-01-22 VITALS — BP 144/82 | HR 85 | Temp 98.0°F | Ht 64.0 in | Wt 181.0 lb

## 2025-01-22 DIAGNOSIS — F419 Anxiety disorder, unspecified: Secondary | ICD-10-CM | POA: Diagnosis not present

## 2025-01-22 DIAGNOSIS — M069 Rheumatoid arthritis, unspecified: Secondary | ICD-10-CM

## 2025-01-22 DIAGNOSIS — K219 Gastro-esophageal reflux disease without esophagitis: Secondary | ICD-10-CM | POA: Diagnosis not present

## 2025-01-22 DIAGNOSIS — Z7984 Long term (current) use of oral hypoglycemic drugs: Secondary | ICD-10-CM | POA: Diagnosis not present

## 2025-01-22 DIAGNOSIS — F439 Reaction to severe stress, unspecified: Secondary | ICD-10-CM

## 2025-01-22 DIAGNOSIS — Z8659 Personal history of other mental and behavioral disorders: Secondary | ICD-10-CM

## 2025-01-22 DIAGNOSIS — Z0000 Encounter for general adult medical examination without abnormal findings: Secondary | ICD-10-CM

## 2025-01-22 DIAGNOSIS — M1712 Unilateral primary osteoarthritis, left knee: Secondary | ICD-10-CM

## 2025-01-22 DIAGNOSIS — M19041 Primary osteoarthritis, right hand: Secondary | ICD-10-CM

## 2025-01-22 DIAGNOSIS — E119 Type 2 diabetes mellitus without complications: Secondary | ICD-10-CM | POA: Diagnosis not present

## 2025-01-22 DIAGNOSIS — Z96651 Presence of right artificial knee joint: Secondary | ICD-10-CM

## 2025-01-22 DIAGNOSIS — Z01818 Encounter for other preprocedural examination: Secondary | ICD-10-CM

## 2025-01-22 DIAGNOSIS — E782 Mixed hyperlipidemia: Secondary | ICD-10-CM

## 2025-01-22 DIAGNOSIS — Z0001 Encounter for general adult medical examination with abnormal findings: Secondary | ICD-10-CM

## 2025-01-22 DIAGNOSIS — E7439 Other disorders of intestinal carbohydrate absorption: Secondary | ICD-10-CM

## 2025-01-22 DIAGNOSIS — E785 Hyperlipidemia, unspecified: Secondary | ICD-10-CM | POA: Diagnosis not present

## 2025-01-22 NOTE — Progress Notes (Signed)
 "   Patient Care Team: Donna Ronal PARAS, MD as PCP - General (Internal Medicine) Nahser, [name], MD (Inactive) as PCP - Cardiology (Cardiology)  Visit Date: 01/22/25  Subjective:    Patient ID: Donna Spears , Female   DOB: 19-May-1956, 69 y.o.    MRN: [id number]   69 y.o. Female presents today for preoperative clearance. Patient has a past medical history of osteoarthritis, Rheumatoid arthritis, Hyperlipidemia, Diabetes Mellitus, Type II, GE Reflux.   She is having a total left knee arthroplasty on 02/08/2025. EKG unchanged from EKG in 2021.    History of anxiety and depression treated with Effexor  75 mg daily.   History of Osteoarthritis, Right Knee S/p Right TKA on 01/27/2024 by Dr. Melodi. She says that she has been stretching her knee twice daily and has noticed definite improvement since her procedure.    History of Rheumatoid arthritis treated with methotrexate  2.5 mg 8 tablets once a week.     History of Hyperlipidemia treated with Atorvastatin  40 mg at bedtime. 06/11/2024 Lipid Panel: LDL 105, elevated from 104 in 02/2023; otherwise WNL.     History of GE Reflux treated with PPI.    History of Diabetes Mellitus, type II treated with Metformin  500 mg twice daily and Januvia  50 mg daily. 06/11/2024 HgbA1c 6.6%, elevated from 6.4% in January 2025, decreased from 6.8% in November 2024.    Past Medical History:  Diagnosis Date   Allergy    Anemia    Anxiety    Arthritis    Asthma    Depression    Diabetes mellitus without complication (HCC)    GERD (gastroesophageal reflux disease)    Hyperlipidemia      Family History  Problem Relation Age of Onset   Heart disease Mother    Hypertension Mother    Hyperlipidemia Father    Heart disease Brother    Hypertension Brother     Social Hx: married. Husband is disabled and patient had a hard year with knee arthroplasty done Feb 2025 and trying to take care of husband.      Review of Systems  All other systems  reviewed and are negative.       Objective:   Vitals: BP (!) 144/82   Pulse 85   Temp 98 F (36.7 C) (Tympanic)   Ht 5' 4 (1.626 m)   Wt 181 lb (82.1 kg)   SpO2 96%   BMI 31.07 kg/m    Physical Exam Vitals and nursing note reviewed.  Constitutional:      General: She is not in acute distress.    Appearance: Normal appearance. She is not ill-appearing or toxic-appearing.  HENT:     Head: Normocephalic and atraumatic.     Right Ear: Hearing, tympanic membrane, ear canal and external ear normal.     Left Ear: Hearing, tympanic membrane, ear canal and external ear normal.     Mouth/Throat:     Pharynx: Oropharynx is clear.  Eyes:     Extraocular Movements: Extraocular movements intact.     Pupils: Pupils are equal, round, and reactive to light.  Neck:     Thyroid: No thyroid mass, thyromegaly or thyroid tenderness.     Vascular: No carotid bruit.  Cardiovascular:     Rate and Rhythm: Normal rate and regular rhythm. No extrasystoles are present.    Pulses:          Dorsalis pedis pulses are 2+ on the right side and 2+ on the left  side.     Heart sounds: Normal heart sounds. No murmur heard.    No friction rub. No gallop.  Pulmonary:     Effort: Pulmonary effort is normal.     Breath sounds: Normal breath sounds. No decreased breath sounds, wheezing, rhonchi or rales.  Chest:     Chest wall: No mass.  Abdominal:     Palpations: Abdomen is soft. There is no hepatomegaly, splenomegaly or mass.     Tenderness: There is no abdominal tenderness.     Hernia: No hernia is present.  Musculoskeletal:     Cervical back: Normal range of motion.     Right lower leg: No edema.     Left lower leg: No edema.  Lymphadenopathy:     Cervical: No cervical adenopathy.     Upper Body:     Right upper body: No supraclavicular adenopathy.     Left upper body: No supraclavicular adenopathy.  Skin:    General: Skin is warm and dry.  Neurological:     General: No focal deficit present.      Mental Status: She is alert and oriented to person, place, and time. Mental status is at baseline.     Sensory: Sensation is intact.     Motor: Motor function is intact. No weakness.     Deep Tendon Reflexes: Reflexes are normal and symmetric.  Psychiatric:        Attention and Perception: Attention normal.        Mood and Affect: Mood normal.        Speech: Speech normal.        Behavior: Behavior normal.        Thought Content: Thought content normal.        Cognition and Memory: Cognition normal.        Judgment: Judgment normal.       Results:   Studies obtained and personally reviewed by me:   Labs:       Component Value Date/Time   NA 142 12/03/2024 1125   K 4.2 12/03/2024 1125   CL 106 12/03/2024 1125   CO2 26 12/03/2024 1125   GLUCOSE 107 (H) 12/03/2024 1125   BUN 18 12/03/2024 1125   CREATININE 0.55 12/03/2024 1125   CALCIUM  9.5 12/03/2024 1125   PROT 6.7 12/03/2024 1125   ALBUMIN 3.9 01/17/2017 1023   AST 20 12/03/2024 1125   ALT 31 (H) 12/03/2024 1125   ALKPHOS 87 01/17/2017 1023   BILITOT 0.5 12/03/2024 1125   GFRNONAA >60 01/28/2024 0338   GFRNONAA 95 09/19/2020 1116   GFRAA 110 09/19/2020 1116     Lab Results  Component Value Date   WBC 5.9 12/03/2024   HGB 13.9 12/03/2024   HCT 42.6 12/03/2024   MCV 88.4 12/03/2024   PLT 270 12/03/2024    Lab Results  Component Value Date   CHOL 174 06/11/2024   HDL 50 06/11/2024   LDLCALC 105 (H) 06/11/2024   TRIG 101 06/11/2024   CHOLHDL 3.5 06/11/2024    Lab Results  Component Value Date   HGBA1C 6.6 (H) 06/11/2024     Lab Results  Component Value Date   TSH 1.64 11/07/2023        Assessment & Plan:   Orders Placed This Encounter  Procedures   DG Chest 2 View    Reason for Exam (SYMPTOM  OR DIAGNOSIS REQUIRED):   pre-op CXR for TKA    Preferred imaging location?:   [facility]  CBC with Differential/Platelet   Comprehensive metabolic panel with GFR   Hemoglobin A1c    Protime-INR   POCT URINALYSIS DIP (CLINITEK)   EKG 12-Lead    Preoperative clearance: She is having a total left knee arthroplasty on 02/08/2025. EKG unchanged from EKG in 2021.    Chest X-ray ordered. Bilateral chronic bronchitic changes  CBC w/ Diff, CMP w/ GFR, HgbA1c, Protime-INR - labs need to be done   Anxiety and depression: treated with Effexor  75 mg daily.   Osteoarthritis: Right Knee S/p Right TKA on 01/27/2024 by Dr. Melodi. She says that she has been stretching her knee twice daily and has noticed definite improvement since her procedure.    Rheumatoid arthritis: treated with methotrexate  2.5 mg 8 tablets once a week.     Hyperlipidemia: treated with Atorvastatin  40 mg at bedtime. 06/11/2024 Lipid Panel: LDL 105, elevated from 104 in 02/2023; otherwise WNL.     GE Reflux: treated with PPI.    Diabetes Mellitus, type II: treated with Metformin  500 mg twice daily and Januvia  50 mg daily. 06/11/2024 HgbA1c 6.6%, elevated from 6.4% in January 2025, decreased from 6.8% in November 2024. Urine microalbumin done  June 2025.  Health care maintenance- suggest mammogram- has declined in the past  Schedule annual Medicare wellness visit for later this year  I,Makayla C Reid,acting as a scribe for Ronal JINNY Hailstone, MD.,have documented all relevant documentation on the behalf of Ronal JINNY Hailstone, MD,as directed by  Ronal JINNY Hailstone, MD while in the presence of Ronal JINNY Hailstone, MD.   I, Ronal JINNY Hailstone, MD, have reviewed all documentation for this visit. The documentation on 01/22/2025 for the exam, diagnosis, procedures, and orders are all accurate and complete.    "

## 2025-01-24 NOTE — Patient Instructions (Addendum)
 Please have CXR today. Need pre-op/ CPE labs as well. EKG done today.

## 2025-02-02 ENCOUNTER — Encounter (HOSPITAL_COMMUNITY): Admission: RE | Admit: 2025-02-02 | Source: Ambulatory Visit

## 2025-02-08 ENCOUNTER — Ambulatory Visit (HOSPITAL_COMMUNITY): Admit: 2025-02-08 | Admitting: Orthopedic Surgery

## 2025-02-08 SURGERY — ARTHROPLASTY, KNEE, TOTAL
Anesthesia: Choice | Site: Knee | Laterality: Left

## 2025-04-06 ENCOUNTER — Ambulatory Visit: Admitting: Internal Medicine

## 2025-04-07 ENCOUNTER — Ambulatory Visit: Admitting: Internal Medicine
